# Patient Record
Sex: Female | Born: 1975 | Race: Black or African American | Hispanic: No | Marital: Married | Smoking: Never smoker
Health system: Southern US, Community
[De-identification: ages and names within clinical notes are randomized; demographics above are authoritative.]

## PROBLEM LIST (undated history)

## (undated) DIAGNOSIS — I519 Heart disease, unspecified: Secondary | ICD-10-CM

## (undated) DIAGNOSIS — D649 Anemia, unspecified: Secondary | ICD-10-CM

## (undated) DIAGNOSIS — G43909 Migraine, unspecified, not intractable, without status migrainosus: Secondary | ICD-10-CM

## (undated) DIAGNOSIS — M1990 Unspecified osteoarthritis, unspecified site: Secondary | ICD-10-CM

## (undated) DIAGNOSIS — O139 Gestational [pregnancy-induced] hypertension without significant proteinuria, unspecified trimester: Secondary | ICD-10-CM

## (undated) DIAGNOSIS — Z862 Personal history of diseases of the blood and blood-forming organs and certain disorders involving the immune mechanism: Secondary | ICD-10-CM

## (undated) DIAGNOSIS — R011 Cardiac murmur, unspecified: Secondary | ICD-10-CM

## (undated) DIAGNOSIS — I10 Essential (primary) hypertension: Secondary | ICD-10-CM

## (undated) DIAGNOSIS — O039 Complete or unspecified spontaneous abortion without complication: Secondary | ICD-10-CM

## (undated) DIAGNOSIS — J45909 Unspecified asthma, uncomplicated: Secondary | ICD-10-CM

## (undated) DIAGNOSIS — M75100 Unspecified rotator cuff tear or rupture of unspecified shoulder, not specified as traumatic: Secondary | ICD-10-CM

## (undated) DIAGNOSIS — E079 Disorder of thyroid, unspecified: Secondary | ICD-10-CM

## (undated) DIAGNOSIS — F1910 Other psychoactive substance abuse, uncomplicated: Secondary | ICD-10-CM

## (undated) DIAGNOSIS — F419 Anxiety disorder, unspecified: Secondary | ICD-10-CM

## (undated) DIAGNOSIS — T7840XA Allergy, unspecified, initial encounter: Secondary | ICD-10-CM

## (undated) DIAGNOSIS — G4733 Obstructive sleep apnea (adult) (pediatric): Secondary | ICD-10-CM

## (undated) HISTORY — DX: Unspecified osteoarthritis, unspecified site: M19.90

## (undated) HISTORY — DX: Obstructive sleep apnea (adult) (pediatric): G47.33

## (undated) HISTORY — DX: Unspecified rotator cuff tear or rupture of unspecified shoulder, not specified as traumatic: M75.100

## (undated) HISTORY — DX: Cardiac murmur, unspecified: R01.1

## (undated) HISTORY — DX: Disorder of thyroid, unspecified: E07.9

## (undated) HISTORY — DX: Migraine, unspecified, not intractable, without status migrainosus: G43.909

## (undated) HISTORY — DX: Heart disease, unspecified: I51.9

## (undated) HISTORY — DX: Anemia, unspecified: D64.9

## (undated) HISTORY — DX: Anxiety disorder, unspecified: F41.9

## (undated) HISTORY — DX: Allergy, unspecified, initial encounter: T78.40XA

## (undated) HISTORY — PX: KNEE ARTHROSCOPY: SUR90

## (undated) HISTORY — DX: Other psychoactive substance abuse, uncomplicated: F19.10

## (undated) HISTORY — PX: KNEE ARTHROSCOPY: SHX127

## (undated) HISTORY — DX: Essential (primary) hypertension: I10

## (undated) HISTORY — DX: Complete or unspecified spontaneous abortion without complication: O03.9

## (undated) HISTORY — DX: Unspecified asthma, uncomplicated: J45.909

## (undated) HISTORY — DX: Gestational (pregnancy-induced) hypertension without significant proteinuria, unspecified trimester: O13.9

## (undated) HISTORY — DX: Personal history of diseases of the blood and blood-forming organs and certain disorders involving the immune mechanism: Z86.2

---

## 1998-07-26 ENCOUNTER — Ambulatory Visit (HOSPITAL_BASED_OUTPATIENT_CLINIC_OR_DEPARTMENT_OTHER): Admission: RE | Admit: 1998-07-26 | Discharge: 1998-07-26 | Payer: Self-pay | Admitting: Orthopedic Surgery

## 1999-09-18 ENCOUNTER — Ambulatory Visit (HOSPITAL_COMMUNITY): Admission: RE | Admit: 1999-09-18 | Discharge: 1999-09-18 | Payer: Self-pay | Admitting: Gastroenterology

## 2000-10-21 ENCOUNTER — Other Ambulatory Visit: Admission: RE | Admit: 2000-10-21 | Discharge: 2000-10-21 | Payer: Self-pay | Admitting: *Deleted

## 2000-12-11 ENCOUNTER — Encounter: Payer: Self-pay | Admitting: Internal Medicine

## 2002-12-01 ENCOUNTER — Other Ambulatory Visit: Admission: RE | Admit: 2002-12-01 | Discharge: 2002-12-01 | Payer: Self-pay | Admitting: *Deleted

## 2003-12-15 LAB — HM COLONOSCOPY

## 2004-12-15 DIAGNOSIS — G43909 Migraine, unspecified, not intractable, without status migrainosus: Secondary | ICD-10-CM

## 2004-12-15 HISTORY — DX: Migraine, unspecified, not intractable, without status migrainosus: G43.909

## 2005-04-07 ENCOUNTER — Emergency Department (HOSPITAL_COMMUNITY): Admission: EM | Admit: 2005-04-07 | Discharge: 2005-04-07 | Payer: Self-pay | Admitting: Family Medicine

## 2006-10-15 DIAGNOSIS — M75100 Unspecified rotator cuff tear or rupture of unspecified shoulder, not specified as traumatic: Secondary | ICD-10-CM

## 2006-10-15 HISTORY — DX: Unspecified rotator cuff tear or rupture of unspecified shoulder, not specified as traumatic: M75.100

## 2006-11-01 ENCOUNTER — Emergency Department (HOSPITAL_COMMUNITY): Admission: EM | Admit: 2006-11-01 | Discharge: 2006-11-01 | Payer: Self-pay | Admitting: Emergency Medicine

## 2006-12-15 HISTORY — PX: SHOULDER ARTHROSCOPY: SHX128

## 2007-06-10 ENCOUNTER — Ambulatory Visit (HOSPITAL_BASED_OUTPATIENT_CLINIC_OR_DEPARTMENT_OTHER): Admission: RE | Admit: 2007-06-10 | Discharge: 2007-06-10 | Payer: Self-pay | Admitting: Orthopedic Surgery

## 2007-08-31 ENCOUNTER — Ambulatory Visit: Payer: Self-pay | Admitting: Internal Medicine

## 2007-08-31 ENCOUNTER — Encounter: Payer: Self-pay | Admitting: Internal Medicine

## 2007-08-31 DIAGNOSIS — I1A0 Resistant hypertension: Secondary | ICD-10-CM | POA: Insufficient documentation

## 2007-08-31 DIAGNOSIS — M549 Dorsalgia, unspecified: Secondary | ICD-10-CM | POA: Insufficient documentation

## 2007-08-31 DIAGNOSIS — R609 Edema, unspecified: Secondary | ICD-10-CM | POA: Insufficient documentation

## 2007-08-31 DIAGNOSIS — I10 Essential (primary) hypertension: Secondary | ICD-10-CM | POA: Insufficient documentation

## 2007-08-31 LAB — CONVERTED CEMR LAB
Alkaline Phosphatase: 75 units/L (ref 39–117)
BUN: 11 mg/dL (ref 6–23)
Basophils Relative: 0.4 % (ref 0.0–1.0)
Bilirubin Urine: NEGATIVE
Bilirubin, Direct: 0.1 mg/dL (ref 0.0–0.3)
CO2: 31 meq/L (ref 19–32)
Creatinine, Ser: 0.8 mg/dL (ref 0.4–1.2)
Crystals: NEGATIVE
Eosinophils Absolute: 0 10*3/uL (ref 0.0–0.6)
Eosinophils Relative: 0.6 % (ref 0.0–5.0)
Glucose, Bld: 95 mg/dL (ref 70–99)
Hemoglobin, Urine: NEGATIVE
Hemoglobin: 11.2 g/dL — ABNORMAL LOW (ref 12.0–15.0)
Ketones, ur: NEGATIVE mg/dL
Leukocytes, UA: NEGATIVE
Lymphocytes Relative: 21.9 % (ref 12.0–46.0)
MCV: 84.3 fL (ref 78.0–100.0)
Monocytes Absolute: 0.6 10*3/uL (ref 0.2–0.7)
Neutro Abs: 5.6 10*3/uL (ref 1.4–7.7)
Nitrite: NEGATIVE
Potassium: 2.7 meq/L — CL (ref 3.5–5.1)
TSH: 0.88 microintl units/mL (ref 0.35–5.50)
Total Bilirubin: 0.5 mg/dL (ref 0.3–1.2)
Total Protein, Urine: NEGATIVE mg/dL
Total Protein: 7.9 g/dL (ref 6.0–8.3)
Urine Glucose: NEGATIVE mg/dL
Urobilinogen, UA: 0.2 (ref 0.0–1.0)
WBC: 8 10*3/uL (ref 4.5–10.5)

## 2007-09-01 ENCOUNTER — Encounter: Payer: Self-pay | Admitting: Internal Medicine

## 2007-09-01 LAB — CONVERTED CEMR LAB: Blood Glucose, Fasting: 95 mg/dL

## 2007-10-09 ENCOUNTER — Encounter: Payer: Self-pay | Admitting: Internal Medicine

## 2007-10-11 ENCOUNTER — Ambulatory Visit: Payer: Self-pay | Admitting: Internal Medicine

## 2007-11-02 ENCOUNTER — Encounter: Payer: Self-pay | Admitting: Internal Medicine

## 2007-11-02 ENCOUNTER — Ambulatory Visit: Payer: Self-pay | Admitting: Internal Medicine

## 2007-11-02 LAB — CONVERTED CEMR LAB
BUN: 12 mg/dL (ref 6–23)
CO2: 24 meq/L (ref 19–32)
Chloride: 103 meq/L (ref 96–112)
Creatinine, Ser: 0.7 mg/dL (ref 0.4–1.2)
Sodium: 138 meq/L (ref 135–145)

## 2007-11-08 ENCOUNTER — Encounter: Payer: Self-pay | Admitting: Internal Medicine

## 2007-11-09 ENCOUNTER — Ambulatory Visit: Payer: Self-pay | Admitting: Internal Medicine

## 2007-11-09 DIAGNOSIS — R7309 Other abnormal glucose: Secondary | ICD-10-CM | POA: Insufficient documentation

## 2007-11-18 ENCOUNTER — Encounter: Payer: Self-pay | Admitting: Internal Medicine

## 2008-01-17 ENCOUNTER — Encounter: Payer: Self-pay | Admitting: Internal Medicine

## 2008-02-10 ENCOUNTER — Ambulatory Visit: Payer: Self-pay | Admitting: Internal Medicine

## 2008-03-08 ENCOUNTER — Ambulatory Visit: Payer: Self-pay | Admitting: Internal Medicine

## 2008-03-09 ENCOUNTER — Encounter: Payer: Self-pay | Admitting: Internal Medicine

## 2009-02-10 ENCOUNTER — Emergency Department (HOSPITAL_COMMUNITY): Admission: EM | Admit: 2009-02-10 | Discharge: 2009-02-10 | Payer: Self-pay | Admitting: Family Medicine

## 2009-08-18 ENCOUNTER — Emergency Department (HOSPITAL_COMMUNITY): Admission: EM | Admit: 2009-08-18 | Discharge: 2009-08-18 | Payer: Self-pay | Admitting: Emergency Medicine

## 2010-01-02 ENCOUNTER — Emergency Department (HOSPITAL_COMMUNITY): Admission: EM | Admit: 2010-01-02 | Discharge: 2010-01-02 | Payer: Self-pay | Admitting: Family Medicine

## 2010-06-05 ENCOUNTER — Encounter: Payer: Self-pay | Admitting: Family Medicine

## 2010-06-05 ENCOUNTER — Telehealth: Payer: Self-pay | Admitting: Family Medicine

## 2010-06-13 ENCOUNTER — Ambulatory Visit: Payer: Self-pay | Admitting: Family Medicine

## 2010-06-28 ENCOUNTER — Telehealth: Payer: Self-pay | Admitting: Family Medicine
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
## 2011-01-16 NOTE — Progress Notes (Signed)
Summary: Pt does not want to have cpx, just labs for cholesterol and bp  Phone Note Call from Patient   Caller: Patient Summary of Call: Pt called and decided that she does not want to have a cpx, she would just like to have labs done to check her cholesterol and her blood pressure. Pt also mentioned getting lab work done to check for cancer. Pt is sch for cpx labs on 07/04/10. Please advise.    Initial call taken by: Lucy Antigua,  June 28, 2010 3:16 PM  Follow-up for Phone Call        We can check lipid panel.  There are no standard labs done to check for things like ovarian cancer.  We can only screen for cervical cancer, breast cancer, etc through good PE and appropriate tests like pap smears.  If she refuses CPE she has to accept that she is choosing not to do appropriate age related screening. Follow-up by: Evelena Peat MD,  July 01, 2010 6:27 PM  Additional Follow-up for Phone Call Additional follow up Details #1::        Pt has been notified, as noted above. Pt is just coming in to get lipid panel only, on July 04, 2010. Pt still refuses CPX. Additional Follow-up by: Lucy Antigua,  July 02, 2010 10:20 AM

## 2011-01-16 NOTE — Progress Notes (Signed)
Summary: Request to Switch PCP  Phone Note Call from Patient   Caller: Patient Summary of Call: Patient would like to switch PCP from Dr. Debby Bud to Dr. Caryl Never Initial call taken by: Trixie Dredge,  June 05, 2010 12:18 PM  Follow-up for Phone Call        ok with me and good luck to Dr. Caryl Never Follow-up by: Jacques Navy MD,  June 05, 2010 1:29 PM  Additional Follow-up for Phone Call Additional follow up Details #1::        OK with me. Additional Follow-up by: Evelena Peat MD,  June 05, 2010 5:18 PM    Additional Follow-up for Phone Call Additional follow up Details #2::    lmom/lmom 06-12-2010 Follow-up by: Heron Sabins,  June 07, 2010 8:36 AM  Additional Follow-up for Phone Call Additional follow up Details #3:: Details for Additional Follow-up Action Taken: ov 06-13-2010 11.45am Additional Follow-up by: Heron Sabins,  June 12, 2010 12:43 PM

## 2011-01-16 NOTE — Letter (Signed)
Summary: Records from [facility] 2008 - 2011  Records from [facility] at [place] 2008 - 2011   Imported By: Maryln Gottron 06/28/2010 13:36:30  _____________________________________________________________________  External Attachment:    Type:   Image     Comment:   External Document

## 2011-01-30 ENCOUNTER — Telehealth: Payer: Self-pay | Admitting: Family Medicine

## 2011-01-30 NOTE — Telephone Encounter (Signed)
Pt wants a referral for [facility] for hand tremors... Pt adv she only wants a referral, doesn't want to have to come in for OV for same.... Pt can be reached at [phone] with any questions or concerns.

## 2011-01-30 NOTE — Telephone Encounter (Signed)
Pt must be seen. She had horribly controlled BP last visit and if we are to have any chance to help her she must improve her compliance with follow up and treatment.

## 2011-01-31 ENCOUNTER — Encounter: Payer: Self-pay | Admitting: Family Medicine

## 2011-01-31 ENCOUNTER — Telehealth: Payer: Self-pay | Admitting: Family Medicine

## 2011-01-31 ENCOUNTER — Ambulatory Visit (INDEPENDENT_AMBULATORY_CARE_PROVIDER_SITE_OTHER): Payer: BC Managed Care – PPO | Admitting: Family Medicine

## 2011-01-31 VITALS — BP 174/120 | Temp 98.4°F | Ht 69.5 in | Wt 200.0 lb

## 2011-01-31 DIAGNOSIS — I10 Essential (primary) hypertension: Secondary | ICD-10-CM

## 2011-01-31 MED ORDER — AMLODIPINE BESYLATE 5 MG PO TABS: 5.0000 mg | ORAL_TABLET | Freq: Every day | ORAL | Status: DC

## 2011-01-31 NOTE — Progress Notes (Signed)
  Subjective:    Patient ID: Leslie [name], female    DOB: April 11, 1976, 35 y.o.   MRN: [id number]  HPI  patient seen for the following issues  She has history of very poorly controlled blood pressure, poor compliance , and poor motivation to treat blood pressure from past. She has been seen once in urgent care center since last visit here many months ago and placed on plain Diovan. Had been on Diovan HCTZ. Not clear why she was switched to plain Diovan with poor control on combination therapy. In any event, she is only taking a half 160 mg Diovan frequently. She does not give any clear reason why she is not taking the full dose. Blood pressures have been very poorly controlled. She denies any headaches or dizziness. Increased job stress. Works as a Runner, broadcasting/film/video. Previous rash with lisinopril. Denies any recent headaches or chest pain.   subtle upper extremity and occasionally head and neck tremor worse with periods of increased stress. Her father has some type of tremor , presumably familial tremor. She denies any slowness of gait or a stiffness or focal weakness.   Patient denies any alcohol use. No illicit drug use. nonsmoker   Review of Systems  Constitutional: Positive for fatigue. Negative for fever, activity change, appetite change and unexpected weight change.  Eyes: Negative for visual disturbance.  Respiratory: Negative for cough, chest tightness, shortness of breath and wheezing.   Cardiovascular: Negative for chest pain, palpitations and leg swelling.  Neurological: Negative for syncope and headaches.  Psychiatric/Behavioral: Negative for confusion.       Objective:   Physical Exam  patient is alert and in no distress.    pupils equal reactive to light. Fundi are benign. No exudates or hemorrhages.   oropharynx clear  Neck no mass and no adenopathy Chest clear to auscultation Heart regular in rate with no murmur Extremities no edema  neurologic exam no visible tremor at this  time. No focal weakness. Gait is normal. Deep tender reflexes are 2+ throughout upper and lower extremities. Cranial nerves all intact. Cerebellar function normal.       Assessment & Plan:   #1 poorly controlled hypertension with history of severe poor compliance  #2 reported tremors with none evident today. 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 She is aware of potential for kidney failure , heart failure , and history of vascular disease in general with poorly controlled blood pressure. Add amlodipine 5 mg daily . Continue Diovan 160 mg. Reassess one month.  We also discussed importance of regular aerobic exercise once BP better controlled and need for weight loss.

## 2011-01-31 NOTE — Telephone Encounter (Signed)
Info only concerning f/u....Marland KitchenMarland KitchenPt was requesting referral to [facility]... Dr Caryl Never adv she needs to come in for eval / OV... Pt was advised of same and appt was scheduled.

## 2011-02-02 ENCOUNTER — Encounter: Payer: Self-pay | Admitting: Family Medicine

## 2011-02-05 ENCOUNTER — Telehealth: Payer: Self-pay | Admitting: Family Medicine

## 2011-02-05 NOTE — Telephone Encounter (Signed)
Pt called and said BP is 150/122. Pt is req to go back on Diovan HCTZ160 mg. Pls call in to [facility].

## 2011-02-05 NOTE — Telephone Encounter (Signed)
Please advise 

## 2011-02-12 ENCOUNTER — Encounter: Payer: Self-pay | Admitting: Family Medicine

## 2011-02-12 ENCOUNTER — Ambulatory Visit (INDEPENDENT_AMBULATORY_CARE_PROVIDER_SITE_OTHER): Payer: BC Managed Care – PPO | Admitting: Family Medicine

## 2011-02-12 VITALS — BP 142/104 | Temp 98.2°F | Ht 70.0 in | Wt 203.0 lb

## 2011-02-12 DIAGNOSIS — I10 Essential (primary) hypertension: Secondary | ICD-10-CM

## 2011-02-12 MED ORDER — VALSARTAN-HYDROCHLOROTHIAZIDE 160-12.5 MG PO TABS: 1.0000 | ORAL_TABLET | Freq: Every day | ORAL | Status: DC

## 2011-02-12 NOTE — Progress Notes (Signed)
  Subjective:    Patient ID: Leslie [name], female    DOB: [birth year], 35 y.o.   MRN: [id number]  HPI  Patient seen for followup hypertension. Long history of poor compliance. She has finally agreed to take her medication. Addition of amlodipine 5 mg daily last visit and taking Diovan somewhat inconsistently but most days one half tablet. Headaches have improved. Overall feels much better since starting regular blood pressure medication. No chest pains. By home blood pressure machine 140s to 150s systolic and 90s 409 diastolic.   Review of Systems  patient denies any headache , dizziness, chest pain, peripheral edema    Objective:   Physical Exam  the patient is alert and in no distress.  Pupils equal round reactive to light Neck supple no mass Chest clear to auscultation Heart regular rhythm and rate Extremities no edema       Assessment & Plan:   hypertension which is improved but not to goal. Continue weight loss efforts. Continue amlodipine 5 mg daily. Add back Diovan HCTZ 160/12.5 one daily. Reassess blood pressure within 3 months and continue home monitoring

## 2011-02-21 ENCOUNTER — Encounter: Payer: Self-pay | Admitting: Family Medicine

## 2011-02-21 ENCOUNTER — Ambulatory Visit (INDEPENDENT_AMBULATORY_CARE_PROVIDER_SITE_OTHER): Payer: BC Managed Care – PPO | Admitting: Family Medicine

## 2011-02-21 VITALS — BP 140/110 | Temp 98.8°F | Ht 69.5 in | Wt 205.0 lb

## 2011-02-21 DIAGNOSIS — I10 Essential (primary) hypertension: Secondary | ICD-10-CM

## 2011-02-21 DIAGNOSIS — F411 Generalized anxiety disorder: Secondary | ICD-10-CM

## 2011-02-21 DIAGNOSIS — J029 Acute pharyngitis, unspecified: Secondary | ICD-10-CM

## 2011-02-21 DIAGNOSIS — F419 Anxiety disorder, unspecified: Secondary | ICD-10-CM

## 2011-02-21 MED ORDER — LORAZEPAM 0.5 MG PO TABS: 0.5000 mg | ORAL_TABLET | Freq: Three times a day (TID) | ORAL | Status: AC

## 2011-02-21 NOTE — Progress Notes (Signed)
  Subjective:    Patient ID: Leslie [name], female    DOB: [birth year], 35 y.o.   MRN: [id number]  HPI  patient seen for the following issues  Sore throat 3 days duration. Some nasal congestion and occasional dry cough. 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 Diffuse body aches. Denies headaches, nausea, or vomiting. Has not taken anything for relief. No aggravating factors. Symptoms seem to be worse early in the morning and at night.    hypertension. Patient history of poor compliance. Has recently been on amlodipine 5 mg daily and Diovan HCTZ 160/12.5 mg one daily. She has had increased compliance recently. No dizziness. No medication side effects.    new problem of complains of increased situational anxiety. Very stressful job Agricultural consultant.  She has bilateral upper extremity tremors during things like parent conference. These are situational. Denies depression   Review of Systems  Constitutional: Positive for fatigue. Negative for fever, chills, activity change, appetite change and unexpected weight change.  HENT: Positive for congestion, sore throat and voice change. Negative for neck pain, neck stiffness and sinus pressure.   Respiratory: Positive for cough. Negative for shortness of breath, wheezing and stridor.   Cardiovascular: Negative for chest pain and palpitations.  Gastrointestinal: Negative for nausea, vomiting, diarrhea, constipation and abdominal distention.  Genitourinary: Negative for dysuria.  Neurological: Negative for dizziness and headaches.  Hematological: Negative for adenopathy.       Objective:   Physical Exam  Constitutional: She is oriented to person, place, and time. She appears well-developed and well-nourished. No distress.  HENT:  Head: Normocephalic and atraumatic.  Right Ear: External ear normal.  Left Ear: External ear normal.  Mouth/Throat: Oropharynx is clear and moist.       Mild erythema post pharynx  Eyes: Conjunctivae and EOM are normal. Pupils  are equal, round, and reactive to light.  Neck: Normal range of motion. Neck supple. No thyromegaly present.  Cardiovascular: Normal rate, regular rhythm and normal heart sounds.  Exam reveals no gallop.   No murmur heard. Pulmonary/Chest: Effort normal and breath sounds normal. She has no wheezes. She has no rales.  Lymphadenopathy:    She has no cervical adenopathy.  Neurological: She is alert and oriented to person, place, and time. No cranial nerve deficit.  Skin: No rash noted.          Assessment & Plan:   #1 probable viral pharyngitis. Rapid strep negative. Try Motrin 800 mg every 8 hours as needed #2 hypertension improved continue current medications and work on weight loss.  #3 situational anxiety. Short-term use of lorazepam 0.5 mg every 8 hours when necessary. Consider counseling for more effective management of anxiety it persist

## 2011-02-21 NOTE — Patient Instructions (Signed)
Drink plenty of fluids. Take Motrin 800 mg every 8 hours as needed for sore throat.

## 2011-02-23 ENCOUNTER — Encounter: Payer: Self-pay | Admitting: Family Medicine

## 2011-02-28 ENCOUNTER — Ambulatory Visit: Payer: BC Managed Care – PPO | Admitting: Family Medicine

## 2011-04-15 ENCOUNTER — Telehealth: Payer: Self-pay | Admitting: Family Medicine

## 2011-04-15 NOTE — Telephone Encounter (Signed)
Pt called and is req a call back from Dr Caryl Never or nurse. Pt said that she went to see Dr Cleta Alberts at [facility] re: pain in hip or leg. Pt says xray was done, and a spot was found right above pts hipbone that could be cancerous. Pt should be getting the xray on thrusday. Pls call.

## 2011-04-16 NOTE — Telephone Encounter (Signed)
Please advise 

## 2011-04-16 NOTE — Telephone Encounter (Signed)
Message left on pt personally identified VM 

## 2011-04-16 NOTE — Telephone Encounter (Signed)
Does pt have X-ray scheduled per Dr Cleta Alberts?  Cannot comment on X-ray done at their office without review.  She should feel free to bring x-ray copy here if she wishes for [place] to review.

## 2011-04-29 ENCOUNTER — Encounter: Payer: Self-pay | Admitting: Internal Medicine
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
## 2011-05-01 ENCOUNTER — Ambulatory Visit (INDEPENDENT_AMBULATORY_CARE_PROVIDER_SITE_OTHER): Payer: BC Managed Care – PPO | Admitting: Internal Medicine

## 2011-05-01 ENCOUNTER — Encounter: Payer: Self-pay | Admitting: Internal Medicine

## 2011-05-01 VITALS — BP 138/109 | HR 85 | Temp 97.3°F | Wt 202.0 lb

## 2011-05-01 DIAGNOSIS — R9389 Abnormal findings on diagnostic imaging of other specified body structures: Secondary | ICD-10-CM
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
## 2011-05-04 ENCOUNTER — Encounter: Payer: Self-pay | Admitting: Internal Medicine

## 2011-05-09 ENCOUNTER — Encounter: Payer: Self-pay | Admitting: Internal Medicine

## 2011-05-15 ENCOUNTER — Ambulatory Visit: Payer: BC Managed Care – PPO | Admitting: Family Medicine

## 2011-06-27 ENCOUNTER — Telehealth: Payer: Self-pay | Admitting: *Deleted

## 2011-06-27 NOTE — Telephone Encounter (Signed)
Pt found out today that she is pregnant. She states her obgyn told her not to take her amlodipine or diovan to you have suggestions for alternative

## 2011-07-01 MED ORDER — METHYLDOPA 250 MG PO TABS: 250.0000 mg | ORAL_TABLET | Freq: Two times a day (BID) | ORAL | Status: DC

## 2011-07-01 NOTE — Telephone Encounter (Signed)
Addended by: Rock Nephew T on: 07/01/2011 11:10 AM   Modules accepted: Orders

## 2011-07-01 NOTE — Telephone Encounter (Signed)
For BP consistently less than 150/95 will hold off on any medication in the first trimester. For BPs running higher recommend methyldopa 250mg  bid, #60 refill x 8

## 2011-07-01 NOTE — Telephone Encounter (Signed)
Patient notified per MD and rx sent to pharmacy. Patient would also like  MD advisement if ok to drink water and vinegar for BP

## 2011-07-04 ENCOUNTER — Ambulatory Visit (INDEPENDENT_AMBULATORY_CARE_PROVIDER_SITE_OTHER): Payer: BC Managed Care – PPO | Admitting: Internal Medicine

## 2011-07-04 ENCOUNTER — Encounter: Payer: Self-pay | Admitting: Internal Medicine

## 2011-07-04 DIAGNOSIS — I10 Essential (primary) hypertension: Secondary | ICD-10-CM

## 2011-07-04 MED ORDER — IBUPROFEN 600 MG PO TABS: 600.0000 mg | ORAL_TABLET | Freq: Four times a day (QID) | ORAL | Status: AC | PRN

## 2011-07-04 NOTE — Patient Instructions (Signed)
For treatment of blood pressure while you have the potential to get pregnant you need to be taking a drug with the lowest possible fetal risk. Will start with methyldopa 250 mg AM and PM. If after 3-5 days your blood pressure continues to run high will increase to 500 mg AM and PM and we will continue to increase the dose of medication until the BP is controlled or we get to a total daily dose of 3,000 mg.  For new OB/Gyn suggest Dr. Dierdre Forth, [facility] [address] [phone]

## 2011-07-08 NOTE — Progress Notes (Signed)
  Subjective:    Patient ID: Leslie [name], female    DOB: 02/10/1976, 35 y.o.   MRN: [id number]  HPI Leslie [name] had called due to her being pregnant and having a need to change her medications. Unfortunately she lost the pregnancy. Her BP does run high. We discussed that she is actively trying to get pregnant so that this affects the choice of medications that are safe to use. Many BP medicaions have serious affects in the first trimester. She has been feeling OK. With her last experience she wishes to change OB/GYN - she is referred to Dr. Pennie Rushing.   PMH, FamHx and SocHx reviewed for any changes and relevance.    Review of Systems No constitutional, HEENT, PULONARY,CARDIA,GI OR GYN ISSUES ON REVIEW.     Objective:   Physical Exam Vitals stable WNWD AA woman in no distress Pul - normal respirations Cor - RRR       Assessment & Plan:

## 2011-07-08 NOTE — Assessment & Plan Note (Signed)
Patient will start on methyldopa 250 mg bid and will titrate up as needed for control.

## 2011-08-25 ENCOUNTER — Telehealth: Payer: Self-pay | Admitting: *Deleted

## 2011-08-25 MED ORDER — METHYLDOPA 250 MG PO TABS: 250.0000 mg | ORAL_TABLET | Freq: Three times a day (TID) | ORAL | Status: DC

## 2011-08-25 NOTE — Telephone Encounter (Signed)
Pt called and states her BP has been elevated with talking her BP med BID. I spoke with Dr Debby Bud and he advised for pt to take Aldomet 250 mg 1 TID. PT informed and medication sent in to pharm

## 2011-10-01 LAB — POCT PREGNANCY, URINE
Operator id: 114531
Preg Test, Ur: NEGATIVE

## 2011-10-01 LAB — POCT I-STAT 4, (NA,K, GLUC, HGB,HCT)
Potassium: 3.4 — ABNORMAL LOW
Sodium: 138

## 2011-11-26 ENCOUNTER — Ambulatory Visit (INDEPENDENT_AMBULATORY_CARE_PROVIDER_SITE_OTHER): Payer: BC Managed Care – PPO

## 2011-11-26 DIAGNOSIS — R110 Nausea: Secondary | ICD-10-CM

## 2011-11-26 DIAGNOSIS — R197 Diarrhea, unspecified: Secondary | ICD-10-CM

## 2012-03-26 ENCOUNTER — Encounter (HOSPITAL_COMMUNITY): Payer: Self-pay | Admitting: *Deleted

## 2012-03-26 ENCOUNTER — Emergency Department (HOSPITAL_COMMUNITY)
Admission: EM | Admit: 2012-03-26 | Discharge: 2012-03-26 | Disposition: A | Discharge status: Home / Self Care | Payer: BC Managed Care – PPO | Source: Home / Self Care | Attending: Emergency Medicine | Admitting: Emergency Medicine

## 2012-03-26 DIAGNOSIS — J019 Acute sinusitis, unspecified: Secondary | ICD-10-CM

## 2012-03-26 DIAGNOSIS — J209 Acute bronchitis, unspecified: Secondary | ICD-10-CM

## 2012-03-26 MED ORDER — BENZONATATE 200 MG PO CAPS: 200.0000 mg | ORAL_CAPSULE | Freq: Three times a day (TID) | ORAL | Status: AC | PRN

## 2012-03-26 MED ORDER — AMOXICILLIN-POT CLAVULANATE 875-125 MG PO TABS: 1.0000 | ORAL_TABLET | Freq: Two times a day (BID) | ORAL | Status: AC

## 2012-03-26 MED ORDER — ALBUTEROL SULFATE HFA 108 (90 BASE) MCG/ACT IN AERS: 1.0000 | INHALATION_SPRAY | Freq: Four times a day (QID) | RESPIRATORY_TRACT | Status: DC | PRN

## 2012-03-26 NOTE — Discharge Instructions (Signed)

## 2012-03-26 NOTE — ED Provider Notes (Signed)
Chief Complaint  Patient presents with  . URI    History of Present Illness:   The patient is a 36 year old female who has had a one-week history of nasal congestion, postnasal drip, headache, and cough productive of yellow sputum. She also notes ear congestion and aching, she has some fever at the onset of her symptoms for about 4 days but then this went away. She also has sore throat, loose stools, her mouth feels dry. She took penicillin 500 mg a day for about 4 days and stopped. [facility] when this all began and was diagnosed with the flu. She was given Flonase which he did not use.  She also has had a long history of high blood pressure. She sees Dr. Illene Regulus for this. She is trying to get pregnant, so he has started her on Aldomet 250 mg 3 times a day. She takes this intermittently and sporadically. She has many excuses why she cannot take it and why she has not see Dr. Debby Bud. She was on Diovan, but Dr. Debby Bud would not prescribe it. She's ordered some from [place] but has not gotten it yet. I told her in no uncertain terms that Diovan was not to be used during pregnancy and she should not take it if she was trying to get pregnant. She denies any headaches, dizziness, shortness of breath, or chest pain.  Review of Systems:  Other than noted above, the patient denies any of the following symptoms. Systemic:  No fever, chills, sweats, fatigue, myalgias, headache, or anorexia. Eye:  No redness, pain or drainage. ENT:  No earache, nasal congestion, rhinorrhea, sinus pressure, or sore throat. Lungs:  No cough, sputum production, wheezing, shortness of breath. Or chest pain. GI:  No nausea, vomiting, abdominal pain or diarrhea. Skin:  No rash or itching.  PMFSH:  Past medical history, family history, social history, meds, and allergies were reviewed.  Physical Exam:   Vital signs:  BP 160/100  Pulse 84  Temp(Src) 98.7 F (37.1 C) (Oral)  Resp 20  SpO2 99%   LMP 03/22/2012 General:  Alert, in no distress. Eye:  No conjunctival injection or drainage. ENT:  TMs and canals were normal, without erythema or inflammation.  Nasal mucosa was congested with yellowish drainage bilaterally.  Mucous membranes were moist.  Pharynx was clear, without exudate or drainage.  There were no oral ulcerations or lesions. Neck:  Supple, no adenopathy, tenderness or mass. Lungs:  No respiratory distress.  She has bilateral rhonchi heard anteriorly, no wheezes or rales.  Breath sounds were clear and equal bilaterally. Heart:  Regular rhythm, without gallops, murmers or rubs. Skin:  Clear, warm, and dry, without rash or lesions.  Assessment:  The primary encounter diagnosis was Acute sinusitis. A diagnosis of Acute bronchitis was also pertinent to this visit.  Plan:   1.  The following meds were prescribed:   New Prescriptions   ALBUTEROL (PROVENTIL HFA;VENTOLIN HFA) 108 (90 BASE) MCG/ACT INHALER    Inhale 1-2 puffs into the lungs every 6 (six) hours as needed for wheezing.   AMOXICILLIN-CLAVULANATE (AUGMENTIN) 875-125 MG PER TABLET    Take 1 tablet by mouth 2 (two) times daily.   BENZONATATE (TESSALON) 200 MG CAPSULE    Take 1 capsule (200 mg total) by mouth 3 (three) times daily as needed for cough.   2.  The patient was instructed in symptomatic care and handouts were given. 3.  The patient was told to  return if becoming worse in any way, if no better in 3 or 4 days, and given some red flag symptoms that would indicate earlier return.  Follow up:  The patient was told to follow up with Dr. Illene Regulus next week for blood pressure. I told her in the meantime to take her Aldomet regularly, 3 times a day, and not to take the Diovan.    Reuben Likes, MD 03/26/12 2107

## 2012-03-26 NOTE — ED Notes (Signed)
Pt c/o sinus and nasal congestion with sorethroat onset over a week ago.  [facility] and was given Flonase and Zyrtec.  STates she hasn't used the Flonase because "I don't like snorting anything in my nose".  Also states that she had some Penicillin at home and took 1 500mg  a day between Sunday and Wednesday.  Pt's BP is elevated, states she's not had her Diovan since Sunday, only takes her Aldomet at night, supposed to take it 3 times a day.  Staets she doesn't have time to worry about it.

## 2012-04-07 ENCOUNTER — Encounter: Payer: Self-pay | Admitting: Internal Medicine

## 2012-04-07 ENCOUNTER — Ambulatory Visit (INDEPENDENT_AMBULATORY_CARE_PROVIDER_SITE_OTHER): Payer: BC Managed Care – PPO | Admitting: Internal Medicine

## 2012-04-07 VITALS — BP 160/102 | HR 78 | Temp 98.5°F | Resp 16 | Wt 209.0 lb

## 2012-04-07 DIAGNOSIS — R059 Cough, unspecified: Secondary | ICD-10-CM

## 2012-04-07 DIAGNOSIS — I10 Essential (primary) hypertension: Secondary | ICD-10-CM

## 2012-04-07 DIAGNOSIS — R05 Cough: Secondary | ICD-10-CM

## 2012-04-07 MED ORDER — BENZONATATE 200 MG PO CAPS: 200.0000 mg | ORAL_CAPSULE | Freq: Three times a day (TID) | ORAL | Status: DC | PRN

## 2012-04-07 MED ORDER — METOPROLOL SUCCINATE ER 50 MG PO TB24: 50.0000 mg | ORAL_TABLET | Freq: Every day | ORAL | Status: DC

## 2012-04-07 MED ORDER — PREDNISONE 10 MG PO TABS: 10.0000 mg | ORAL_TABLET | Freq: Every day | ORAL | Status: DC

## 2012-04-07 MED ORDER — PROMETHAZINE-CODEINE 6.25-10 MG/5ML PO SYRP: 5.0000 mL | ORAL_SOLUTION | ORAL | Status: AC | PRN

## 2012-04-07 MED ORDER — GABAPENTIN 600 MG PO TABS: 600.0000 mg | ORAL_TABLET | Freq: Three times a day (TID) | ORAL | Status: DC

## 2012-04-07 NOTE — Patient Instructions (Signed)
Cough - there is no evidence of any infection. 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 Plan - prednisone burst and taper as directed; continue to take tessalon perles three times a day; during the day use robitussin Dm (or the generic equivalent) every 4 hours and at evening/ bedtime take promethazine with codeine. No indication for antibiotics. You are not infectious.  Blood pressure - a serious problem. Plan - stop methyldopa. Start Metoprolol XR 50 mg once a day. You will need to monitor your blood pressure or return for a blood pressure check in 3-4 weeks.

## 2012-04-07 NOTE — Progress Notes (Signed)
  Subjective:    Patient ID: Leslie [name], female    DOB: Jan 15, 1976, 36 y.o.   MRN: [id number]  HPI Ms. Depass present for follow-up of URI. [facility] - diagnosed with flu due to temp of 100 and cough. She was not treated with antibiotics or tamiflu. She continued to have a productive cough and went to [facility] 12th- note reviewed. She was started on Augmentin and tessalon perles. She has finished the antibiotic and has one tessalon left. She continues to cough but swallows any mucus. She has not had any recurrent fever. She reports that deep inspiration causes coughing and that she has DOE, has to stop to rest while climbing stair. She is not taking any cough syrup or product on a regular basis.  She continues to have high blood pressure. She has been taking methydopa once or twice a day. She has had some chest pain which sounds more like chest wall pain as opposed to Cardiac type pain.  PMH, FamHx and SocHx reviewed for any changes and relevance.    Review of Systems System review is negative for any constitutional, cardiac, pulmonary, GI or neuro symptoms or complaints other than as described in the HPI.      Objective:   Physical Exam Filed Vitals:   04/07/12 1626  BP: 160/102  Pulse: 78  Temp: 98.5 F (36.9 C)  Resp: 16   Wt Readings from Last 3 Encounters:  04/07/12 209 lb (94.802 kg)  07/04/11 207 lb (93.895 kg)  05/01/11 202 lb (91.627 kg)   Gen'l- overweight AA woman in no distress HEENT- throat clear Nodes - negative Pulm - normal breath sounds, no rales or wheezes, no increased work of breathing Cor - RRR       Assessment & Plan:  Cyclical cough - explained to patient in detail. She is resistant to treatment suggestions.  Plan -  tessalon perles  Short course of prednisone  Prom/cod for night-time use

## 2012-04-11 NOTE — Assessment & Plan Note (Signed)
Continues to have poor control. She does not take methyldopa as scheduled. She does admit that she continues to try to get pregnant. Explained the teratogenic side effects of ARB and ACE-I drugs.  Plan - toprol XL 50 mg once a day           BP check in 1-2 weeks.

## 2012-06-03 ENCOUNTER — Telehealth: Payer: Self-pay

## 2012-06-03 NOTE — Telephone Encounter (Signed)
Pt would like to have her hemosure lab results  from last summer pt is unsure what date.

## 2012-06-04 NOTE — Telephone Encounter (Signed)
Printed out phone message due to patient only having a paper chart. °

## 2012-06-05 ENCOUNTER — Ambulatory Visit (INDEPENDENT_AMBULATORY_CARE_PROVIDER_SITE_OTHER): Payer: BC Managed Care – PPO | Admitting: Family Medicine

## 2012-06-05 VITALS — BP 163/102 | HR 82 | Temp 99.0°F | Resp 16 | Ht 70.0 in | Wt 206.0 lb

## 2012-06-05 DIAGNOSIS — Z129 Encounter for screening for malignant neoplasm, site unspecified: Secondary | ICD-10-CM

## 2012-06-05 DIAGNOSIS — I10 Essential (primary) hypertension: Secondary | ICD-10-CM

## 2012-06-05 NOTE — Patient Instructions (Signed)
Do a followup on your blood pressure with your primary care physician. It was 167/104. You might need either an increasing the metoprolol or adding something like a mild diuretic to it.  During the 3 stool specimens back for checked for blood.

## 2012-06-05 NOTE — Progress Notes (Signed)
Subjective: Patient is here with a cancer policy that she wants a cefepime assures done for. The policy is very vague it doesn't make it clear exactly what she is getting her why she is getting it.  She is also on a blood pressure medication, was recently switched to metoprolol.  Objective: Blood pressure is still elevated today. I got 167/104.  Assessment: Screening for colon cancer Hypertension  Plan: He was sure x3 Advised he should followup with her other physicians on the blood pressure. She may need the metoprolol increased or hydrochlorothiazide would be my considerations.

## 2012-06-08 ENCOUNTER — Other Ambulatory Visit: Payer: Self-pay | Admitting: *Deleted

## 2012-06-15 ENCOUNTER — Encounter: Payer: Self-pay | Admitting: Obstetrics and Gynecology

## 2012-07-01 ENCOUNTER — Ambulatory Visit (INDEPENDENT_AMBULATORY_CARE_PROVIDER_SITE_OTHER): Payer: BC Managed Care – PPO | Admitting: Obstetrics and Gynecology

## 2012-07-01 ENCOUNTER — Encounter: Payer: Self-pay | Admitting: Obstetrics and Gynecology

## 2012-07-01 VITALS — BP 142/72 | Ht 70.0 in | Wt 206.0 lb

## 2012-07-01 DIAGNOSIS — N970 Female infertility associated with anovulation: Secondary | ICD-10-CM

## 2012-07-01 DIAGNOSIS — I10 Essential (primary) hypertension: Secondary | ICD-10-CM

## 2012-07-01 DIAGNOSIS — Z8669 Personal history of other diseases of the nervous system and sense organs: Secondary | ICD-10-CM

## 2012-07-01 NOTE — Progress Notes (Signed)
Subjective:    Leslie [name] is a 36 y.o. female G1P0010 who presents for evaluation of infertility. Patient and partner have been attempting conception since September 2012. Partner:  is 92 years old does not have children. Couple has been attempting conception since September 2012. Pregnancies with current partner: SAB in July 2012. The patient gives a history of having ordered Clomid from a pharmacy in [place] and taking it "off and on".  She states she would like to have twins and this is the only way she knows to ensure it. She states she is certain the medication has no side effects.  Menstrual history:   LMP:  Patient's last menstrual period was 06/21/2012. Menarche:  36 years old Menstrual cycle:  flow is moderate and regular every 30 days without intermenstrual spotting Pelvic pain: none  Endocrine history:   Basal body temperature Biphasic not done  TSH Pending {  Prolactin pending   FSH pending  Day 21 progesterone pending  Hysterosalpyngogram not done  Glucose:Insulin ratio not done {  Semen analysis not done.  has fathered a prior pregnancy  Medications Clomid (see Notes above)  Other therapies None known  Insemination none   Obstetrical history:   one prior pregnancy ending in spontaneous abortion  Contraception history:   None married since 2011 and trying to conceive since then   Habits:   Patient reports:  History   Social History  . Marital Status: Single    Spouse Name: N/A    Number of Children: N/A  . Years of Education: 54   Occupational History  . teacher    Social History Main Topics  . Smoking status: Never Smoker   . Smokeless tobacco: Never Used  . Alcohol Use: No  . Drug Use: No  . Sexually Active: Yes -- Female partner(s)    Birth Control/ Protection: None   Other Topics Concern  . None   Social History Narrative   [employer] - Pharmacologist; [employer] -MS Programmer, systems.  Married - '11. No children. Spends weekdays with her  grandfather who requires some attendance, weekends with her husband in their home. 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      The following portions of the patient's history were reviewed and updated as appropriate: allergies, current medications, past family history, past medical history, past social history, past surgical history and problem list.  Review of Systems Pertinent items are noted in HPI.   Objective:     BP 142/72  Ht 5\' 10"  (1.778 m)  Wt 206 lb (93.441 kg)  BMI 29.56 kg/m2  LMP 06/21/2012  Breastfeeding? Unknown   Wt Readings from Last 1 Encounters:  07/01/12 206 lb (93.441 kg)    BMI: Body mass index is 29.56 kg/(m^2). General Appearance: Alert, appropriate appearance for age. 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  Self-medication for undiagnosed etiology of infertility withClomid ordered from [place] Chronic hypertension Migraine headache Anxiety  Plan:   Tests ordered:   Declined to have any testing today. Release of information is obtained for records from her prior physician as she states that she did lots of blood work. We had a prolonged discussion about our physician patient/client relationship. I advised the patient that I would be unable to continue working with her if she continued to self medicate with prescription  medications that were ordered and ingested without my knowledge and without my input. She agreed that she would no longer take the unprescribed Clomid medication.   Release of information for all tests ordered from prior gynecologist If [facility], LH, TSH,  Prolactin, and progesterone have not been ordered she will have those drawn on 07/12/2012, day 22 of her cycle The patient is to make an appointment with her headache wellness physician to assess her  continued need for Neurontin for management of her migraines She is to discontinue use of her Xanax (category D.) psychiatric and counseling followup will be recommended if needed once this medication is discontinued She will begin Pregnitude once daily Follow-up:  in 4 week(s)  Riya Huxford PMD 7/19/201312:53 PM

## 2012-07-01 NOTE — Patient Instructions (Addendum)
OTC Pregnitude once daily

## 2012-07-02 ENCOUNTER — Encounter: Payer: Self-pay | Admitting: Obstetrics and Gynecology

## 2012-07-02 MED ORDER — INOSITOL-FOLIC ACID 2000-200 MG-MCG PO PACK: 1.0000 | PACK | Freq: Every day | ORAL | Status: DC

## 2012-07-12 ENCOUNTER — Other Ambulatory Visit: Payer: BC Managed Care – PPO

## 2012-07-12 DIAGNOSIS — N970 Female infertility associated with anovulation: Secondary | ICD-10-CM

## 2012-07-12 LAB — PROLACTIN: Prolactin: 13.6 ng/mL

## 2012-07-29 ENCOUNTER — Encounter: Payer: Self-pay | Admitting: Obstetrics and Gynecology

## 2012-07-29 ENCOUNTER — Ambulatory Visit (INDEPENDENT_AMBULATORY_CARE_PROVIDER_SITE_OTHER): Payer: BC Managed Care – PPO | Admitting: Obstetrics and Gynecology

## 2012-07-29 VITALS — BP 132/72 | Ht 70.0 in | Wt 210.0 lb

## 2012-07-29 DIAGNOSIS — N970 Female infertility associated with anovulation: Secondary | ICD-10-CM

## 2012-07-29 DIAGNOSIS — F419 Anxiety disorder, unspecified: Secondary | ICD-10-CM

## 2012-07-29 DIAGNOSIS — F411 Generalized anxiety disorder: Secondary | ICD-10-CM

## 2012-07-29 NOTE — Progress Notes (Signed)
Subjective:  Infertility: Declines Vaginal Exam.   Last Pap: 1 year ago Trying to Conceive: yes G,P: 1,0 TSH: Normal FSH/LH: 2.7/2.4 PRL: normal DAY 21 PROG: 2.4 but performed to 3 days prior to an early cycle HSG: not done Semen Analysis: not done LSC: none FBS/INSULIN:none GC/CHLAMYDIA: not done      Leslie [name] is a 36 y.o. female G1P0010 who presents with a diagnosis of  Infertility. She had a spontaneous pregnancy which and. In spontaneous abortion and in the Spring of 2012.  She has been trying since that time to become pregnant. Her laboratory studies from her last visit are consistent with anovulation.  Current medication: see Current listing. The patient had her Neurontin which was being used for migraine headaches discontinued by her physician at the headache wellness Center.  She has likewise discontinued Xanax.  Objective:   Infertility laboratory results have been updated and/or reviewed:  yes    BP 132/72  Ht 5\' 10"  (1.778 m)  Wt 210 lb (95.255 kg)  BMI 30.13 kg/m2  LMP 07/15/2012  Wt Readings from Last 1 Encounters:  07/29/12 210 lb (95.255 kg)    BMI: Body mass index is 30.13 kg/(m^2).  Pelvic exam:   declined  Assessment:  Primary infertility Probable oligo ovulation  Plan:  Clomid 50 mg p.o. Daily days 3 through 7 Day 21 progesterone Timed intercourse every other day from day 11 through 17 Follow-up: between days 28 and 32 of the next cycle   Delaine Lame 8/15/20132:17 PM

## 2012-07-30 ENCOUNTER — Telehealth: Payer: Self-pay

## 2012-07-30 NOTE — Telephone Encounter (Signed)
Tc from pt per telephone message. Pt wants to let vph know that she was given Reglan 10mg  by Dr. Neale Burly for her migraines. Will make vph aware.

## 2012-08-02 ENCOUNTER — Other Ambulatory Visit: Payer: Self-pay | Admitting: Obstetrics and Gynecology

## 2012-08-11 ENCOUNTER — Other Ambulatory Visit: Payer: Self-pay

## 2012-08-11 DIAGNOSIS — N979 Female infertility, unspecified: Secondary | ICD-10-CM

## 2012-08-11 MED ORDER — CLOMIPHENE CITRATE 50 MG PO TABS: ORAL_TABLET | ORAL | Status: DC

## 2012-08-24 ENCOUNTER — Telehealth: Payer: Self-pay | Admitting: Obstetrics and Gynecology

## 2012-08-24 NOTE — Telephone Encounter (Signed)
Tc from pt. Pt states,"needs labs after taking Clomid per Dr.Haygood". LMP-08/11/12. Day 21 progesterone sched 08/31/12 with lab only and fu with vh on 09/06/12 with vph. Pt voices understanding.

## 2012-08-24 NOTE — Telephone Encounter (Signed)
Lm on vm to cb per telephone call.  

## 2012-08-24 NOTE — Telephone Encounter (Signed)
vph pt 

## 2012-08-31 ENCOUNTER — Other Ambulatory Visit: Payer: BC Managed Care – PPO

## 2012-08-31 DIAGNOSIS — N979 Female infertility, unspecified: Secondary | ICD-10-CM

## 2012-09-06 ENCOUNTER — Encounter: Payer: Self-pay | Admitting: Obstetrics and Gynecology

## 2012-09-06 ENCOUNTER — Ambulatory Visit (INDEPENDENT_AMBULATORY_CARE_PROVIDER_SITE_OTHER): Payer: BC Managed Care – PPO | Admitting: Obstetrics and Gynecology

## 2012-09-06 VITALS — BP 116/70 | Wt 209.0 lb

## 2012-09-06 DIAGNOSIS — F411 Generalized anxiety disorder: Secondary | ICD-10-CM

## 2012-09-06 DIAGNOSIS — N979 Female infertility, unspecified: Secondary | ICD-10-CM

## 2012-09-06 DIAGNOSIS — N970 Female infertility associated with anovulation: Secondary | ICD-10-CM

## 2012-09-06 DIAGNOSIS — F419 Anxiety disorder, unspecified: Secondary | ICD-10-CM

## 2012-09-06 MED ORDER — CLOMIPHENE CITRATE 50 MG PO TABS: ORAL_TABLET | ORAL | Status: DC

## 2012-09-06 NOTE — Patient Instructions (Signed)
Increase clomid to 100mg  (2 tablets) daily days 3-7 of next cycle.  Call with next cycle to make day 28-32 appt.

## 2012-09-06 NOTE — Progress Notes (Signed)
INFERTILITY FOLLOWUP  Subjective:    Leslie [name] is a 36 y.o. female G1P0010 who presents with a diagnosis of  ovulation factor for F/U.  Pt states she and husband had intercourse only 2 times last cycle because he was having a "hard time" and  Couldn't "do anything".  She states he wants a urology referral.  Current medication: CLOMID 50 mg daily days 3-7  Objective:   Infertility laboratory results have been updated and/or reviewed:  and are remarkable for:Day 21 progesterone 11    Wt 209 lb (94.802 kg)  LMP 08/11/2012  Breastfeeding? No  Wt Readings from Last 1 Encounters:  09/06/12 209 lb (94.802 kg)    BMI: There is no height on file to calculate BMI.  Pelvic exam:   Normal external genitalia    Normal uterus    Normal adnexa   Exam compromised by pt habitus and anxiety  Assessment:   Infertility Oligo-ovulation Female factor with Erectile dysfunction per pt Plan:  Pt requests urology referral for her husband Increase clomid to 100 mg daily days 3-7 Timed intercourse May need IUI if ED persists Follow-up:  Days 28-32 of next cycle  Dierdre Forth MD 9/23/20135:18 PM

## 2012-09-08 ENCOUNTER — Telehealth: Payer: Self-pay

## 2012-09-08 NOTE — Telephone Encounter (Signed)
Tc to pt. Informed pt needs spouse information to complete urology referral. Pt opts to cb if decides to have referral done due to personal reasons. Pt agrees.

## 2012-09-08 NOTE — Telephone Encounter (Signed)
Message copied by Raylene Everts on Wed Sep 08, 2012 10:56 AM ------      Message from: Dierdre Forth P      Created: Mon Sep 06, 2012  6:05 PM       Husband needs urology referral.  DX:  Infertility with anovulation.  Reports erectile dysfunction

## 2012-09-13 ENCOUNTER — Telehealth: Payer: Self-pay | Admitting: Family Medicine

## 2012-09-13 NOTE — Telephone Encounter (Signed)
Pt called and said that she was a pt of Dr Caryl Never, but has been seeing Dr Arthur Holms for about a yr but is req to change back to Dr Caryl Never as pcp.

## 2012-09-14 NOTE — Telephone Encounter (Signed)
Please advise 

## 2012-09-14 NOTE — Telephone Encounter (Signed)
NO.  We cannot keep changing physicians. We need to make sure her primary is changed back to Dr Debby Bud since he has been seeing her.

## 2012-09-14 NOTE — Telephone Encounter (Signed)
Holly, please assist with this and thank you.

## 2012-09-14 NOTE — Telephone Encounter (Signed)
Patient est w/Dr. Debby Bud 05/01/2011. PCP banner has been changed to reflect that, and noted. Okey Regal, please call pt back and let her know that per Dr. Caryl Never, she has est w/Dr. Debby Bud, and Dr. Caryl Never will not agree to another transfer.

## 2012-09-14 NOTE — Telephone Encounter (Signed)
Called pt and notified her that Dr Caryl Never has declined pts req to re-est.

## 2012-09-27 ENCOUNTER — Telehealth: Payer: Self-pay | Admitting: Internal Medicine

## 2012-09-27 NOTE — Telephone Encounter (Signed)
Patient is requesting to switch from Dr Debby Bud to another physician because he made the comment that "she was one of his youngest patients" and now she feels that she should see a doctor that sees patients that are more her age, would you be willing to accept this patient (see phone note from 09/13/2012 as well)

## 2012-09-27 NOTE — Telephone Encounter (Signed)
ok 

## 2012-09-28 NOTE — Telephone Encounter (Signed)
1. My youngest patient is in their early 94's 2. See her record with MD changes, urgent care visits, etc 3. Fine with me for her to change to a "Younger" doctor

## 2012-10-01 ENCOUNTER — Telehealth: Payer: Self-pay | Admitting: Obstetrics and Gynecology

## 2012-10-01 NOTE — Telephone Encounter (Signed)
Lm on vm to cb per telephone call.  

## 2012-10-04 ENCOUNTER — Telehealth: Payer: Self-pay | Admitting: Obstetrics and Gynecology

## 2012-10-05 NOTE — Telephone Encounter (Signed)
Lm on vm to cb per telephone call.  

## 2012-10-07 NOTE — Telephone Encounter (Signed)
10-06-12. Tc from pt. LMP-10-06-12. Pt wants to proceed with referral for spouse to urologist for eval of ? ED. Pt informed to start Clomid 100mg  day 3-7 of cycle per [facility] recs. Pt needs follow up appt in 12/2012 if no conception. Pt voices understanding. Will call pt with urology appt once referral appt made. Pt voices understanding.

## 2012-10-10 ENCOUNTER — Ambulatory Visit: Payer: BC Managed Care – PPO

## 2012-10-10 ENCOUNTER — Ambulatory Visit (INDEPENDENT_AMBULATORY_CARE_PROVIDER_SITE_OTHER): Payer: BC Managed Care – PPO | Admitting: Internal Medicine

## 2012-10-10 VITALS — BP 142/86 | HR 72 | Temp 98.3°F | Resp 16 | Ht 70.0 in | Wt 206.4 lb

## 2012-10-10 DIAGNOSIS — S96911A Strain of unspecified muscle and tendon at ankle and foot level, right foot, initial encounter: Secondary | ICD-10-CM

## 2012-10-10 DIAGNOSIS — S93519A Sprain of interphalangeal joint of unspecified toe(s), initial encounter: Secondary | ICD-10-CM

## 2012-10-10 DIAGNOSIS — M79671 Pain in right foot: Secondary | ICD-10-CM

## 2012-10-10 DIAGNOSIS — M79609 Pain in unspecified limb: Secondary | ICD-10-CM

## 2012-10-10 NOTE — Progress Notes (Signed)
  Subjective:    Patient ID: Leslie [name], female    DOB: [birth year], 36 y.o.   MRN: [id number]  HPI Running in dark in yard and tripped over root of tree. Had immediate pain right great toe, mostly at 1st MTPJ   Review of Systems     Objective:   Physical Exam Right foot no swelling seen NMVS intact Ankle has full rom  [facility] reading (PRIMARY) by  Dr.Genna Casimir no fx seen       Assessment & Plan:  RICE Camwalker

## 2012-10-10 NOTE — Patient Instructions (Addendum)
Turf Toe with Rehab Injury to the base of the big toe (first metatarsal phalangeal joint) that causes damage to the joint capsule and ligaments is known as turf toe. Turf toe commonly occurs on the bottom side of the joint. SYMPTOMS   Pain, tenderness, inflammation and/or bruising around the big toe (contusion).  Pain that worsens with movement of the big toe, specifically when raising (extending) the toe.  Inability to walk properly on the affected foot, which causes one to limp. CAUSES  A force being placed on the joint capsule and ligaments that is greater than they can withstand causes turf toe. Common mechanisms of injury include:  Repetitive and/or strenuous extension of the big toe (ie. standing on tip toes).  Explosive running starts (ie. sprinters).  "Stubbing" the big toe.  Another player landing on your foot. RISK INCREASES WITH:  Previous toe injury.  Having a long first toe.  Flat feet.  Arthritis of the great toe.  Improperly fitted shoes or shoes that are not appropriate for a given activity.  Family history of foot abnormalities.  Activities that involve explosive running starts, standing on tip toes, or jumping. PREVENTION  Wear properly fitted shoes that are appropriate for the sport or activity.  Protect the first toe by taping it to reduce motion.  Maintain physical fitness:  Strength, flexibility, and endurance.  Cardiovascular fitness. PROGNOSIS  If treated properly, the symptoms of turf toes usually resolve with non-surgical (conservative) treatment. Occasionally surgery is necessary. RELATED COMPLICATIONS  Recurrent symptoms that result in a chronic problem.  Inability to compete in athletics.  Prolonged healing time, if improperly treated or re-injured.  Other foot injuries that occur due to protecting the first toe from pain.  Loss of motion in the first toe (hallux rigidus).  Bunion (hallux valgus). TREATMENT  Treatment  initially involves resting from any activities that aggravate the symptoms, and the use of ice and medications to help reduce pain and inflammation. The use of range-of-motion exercises may help reduce pain with activity. It is important that you wear properly fitted shoes with a stiff sole and a wide toe box, in order to reduce the pressure on the first toe. Protecting your big toe by taping it to restrict movement may allow you to return to sports earlier without pain or discomfort. If the condition becomes chronic, then your caregiver may recommend a corticosteroid injection to help reduce inflammation. If symptoms persist despite non-surgical treatment, then surgery may be recommended. MEDICATION  If pain medication is necessary, then nonsteroidal anti-inflammatory medications, such as aspirin and ibuprofen, or other minor pain relievers, such as acetaminophen, are often recommended.  Do not take pain medication for 7 days before surgery.  Prescription pain relievers may be given if deemed necessary by your caregiver. Use only as directed and only as much as you need.  Ointments applied to the skin may be helpful.  Corticosteroid injections may be given by your caregiver. These injections should be reserved for the most serious cases, because they may only be given a certain number of times. HEAT AND COLD  Cold treatment (icing) relieves pain and reduces inflammation. Cold treatment should be applied for 10 to 15 minutes every 2 to 3 hours for inflammation and pain and immediately after any activity that aggravates your symptoms. Use ice packs or massage the area with a piece of ice (ice massage).  Heat treatment may be used prior to performing the stretching and strengthening activities prescribed by your caregiver, physical therapist,  or Event organiser. Use a heat pack or soak the injury in warm water. SEEK MEDICAL CARE IF:  Treatment seems to offer no benefit, or the condition  worsens.  Any medications produce adverse side effects. EXERCISES RANGE OF MOTION (ROM) AND STRETCHING EXERCISES - Turf Toe These exercises may help you when beginning to rehabilitate your injury. Your symptoms may resolve with or without further involvement from your physician, physical therapist or athletic trainer. While completing these exercises, remember:  Restoring tissue flexibility helps normal motion to return to the joints. This allows healthier, less painful movement and activity.  An effective stretch should be held for at least 30 seconds.  A stretch should never be painful. You should only feel a gentle lengthening or release in the stretched tissue. RANGE OF MOTION - Toe Extension, Flexion  Sit with your right / left leg crossed over your opposite knee.  Grasp your toes and gently pull them back toward the top of your foot. You should feel a stretch on the bottom of your toes and/or foot.  Hold this stretch for __________ seconds.  Now, gently pull your toes toward the bottom of your foot. You should feel a stretch on the top of your toes and or foot.  Hold this stretch for __________ seconds. Repeat __________ times. Complete this stretch __________ times per day. RANGE OF MOTION- Ankle Plantar Flexion  Sit with your right / left leg crossed over your opposite knee.  Use your opposite hand to pull the top of your foot and toes toward you.  You should feel a gentle stretch on the top of your foot/ankle. Hold this position for __________ seconds. Repeat __________ times. Complete __________ times per day. STRENGTHENING EXERCISES - Turf Toe These exercises may help you when beginning to rehabilitate your injury. They may resolve your symptoms with or without further involvement from your physician, physical therapist or athletic trainer. While completing these exercises, remember:  Muscles can gain both the endurance and the strength needed for everyday activities  through controlled exercises.  Complete these exercises as instructed by your physician, physical therapist or athletic trainer. Progress with the resistance and repetition exercises only as your caregiver advises.  You may experience muscle soreness or fatigue, but the pain or discomfort you are trying to eliminate should never worsen during these exercises. If this pain does worsen, stop and make certain you are following the directions exactly. If the pain is still present after adjustments, discontinue the exercise until you can discuss the trouble with your clinician. STRENGTH - Towel Curls  Sit in a chair positioned on a non-carpeted surface.  Place your foot on a towel, keeping your heel on the floor.  Pull the towel toward your heel by only curling your toes. Keep your heel on the floor.  If instructed by your physician, physical therapist or athletic trainer, add ____________________ at the end of the towel. Repeat __________ times. Complete this exercise __________ times per day. Document Released: 12/01/2005 Document Revised: 02/23/2012 Document Reviewed: 03/15/2009 [facility] Patient Information 2013 [place], [place].

## 2012-10-11 ENCOUNTER — Encounter: Payer: Self-pay | Admitting: Radiology

## 2012-10-14 ENCOUNTER — Telehealth: Payer: Self-pay | Admitting: Obstetrics and Gynecology

## 2012-10-14 MED ORDER — PRENAISSANCE 29-1.25-325 MG PO CAPS: 1.0000 | ORAL_CAPSULE | Freq: Every day | ORAL | Status: DC

## 2012-10-14 NOTE — Telephone Encounter (Signed)
Tc to pt. Rx for [facility] e-pres to pharm on file. Pt agrees.

## 2012-11-24 ENCOUNTER — Telehealth: Payer: Self-pay

## 2012-11-24 NOTE — Telephone Encounter (Signed)
Patient called [facility] twice stating that due to family issues " Dad on life support" she would like a RX for valium. Called pt [facility] advising that per system she requested to switch from Dr Debby Bud to Dr Yetta Barre on 09/27/12, however she has not seen/established with Dr. Yetta Barre yet. Patient was last seen by Dr. Debby Bud on 04/07/12 and due to pt has never seen Dr. Yetta Barre he can not prescribe any medications.

## 2012-12-16 ENCOUNTER — Ambulatory Visit: Payer: BC Managed Care – PPO | Admitting: Internal Medicine

## 2013-01-05 ENCOUNTER — Ambulatory Visit (INDEPENDENT_AMBULATORY_CARE_PROVIDER_SITE_OTHER): Payer: BC Managed Care – PPO | Admitting: Internal Medicine

## 2013-01-05 ENCOUNTER — Encounter: Payer: Self-pay | Admitting: Internal Medicine

## 2013-01-05 ENCOUNTER — Other Ambulatory Visit (INDEPENDENT_AMBULATORY_CARE_PROVIDER_SITE_OTHER): Payer: BC Managed Care – PPO

## 2013-01-05 VITALS — BP 138/98 | HR 86 | Temp 98.4°F | Resp 16 | Ht 70.0 in | Wt 211.0 lb

## 2013-01-05 DIAGNOSIS — Z3490 Encounter for supervision of normal pregnancy, unspecified, unspecified trimester: Secondary | ICD-10-CM

## 2013-01-05 DIAGNOSIS — N912 Amenorrhea, unspecified: Secondary | ICD-10-CM

## 2013-01-05 DIAGNOSIS — R7309 Other abnormal glucose: Secondary | ICD-10-CM

## 2013-01-05 DIAGNOSIS — Z331 Pregnant state, incidental: Secondary | ICD-10-CM

## 2013-01-05 DIAGNOSIS — I10 Essential (primary) hypertension: Secondary | ICD-10-CM

## 2013-01-05 DIAGNOSIS — D649 Anemia, unspecified: Secondary | ICD-10-CM

## 2013-01-05 LAB — CBC WITH DIFFERENTIAL/PLATELET
Eosinophils Absolute: 0 10*3/uL (ref 0.0–0.7)
Eosinophils Relative: 0.5 % (ref 0.0–5.0)
Lymphocytes Relative: 24.6 % (ref 12.0–46.0)
MCV: 84 fl (ref 78.0–100.0)
Monocytes Absolute: 0.4 10*3/uL (ref 0.1–1.0)
Neutrophils Relative %: 66.8 % (ref 43.0–77.0)
Platelets: 255 10*3/uL (ref 150.0–400.0)
WBC: 5.7 10*3/uL (ref 4.5–10.5)

## 2013-01-05 LAB — URINALYSIS, ROUTINE W REFLEX MICROSCOPIC
Bilirubin Urine: NEGATIVE
Ketones, ur: NEGATIVE
Nitrite: NEGATIVE
Total Protein, Urine: NEGATIVE
Urine Glucose: NEGATIVE
pH: 6 (ref 5.0–8.0)

## 2013-01-05 LAB — COMPREHENSIVE METABOLIC PANEL
AST: 15 U/L (ref 0–37)
Albumin: 4.1 g/dL (ref 3.5–5.2)
Alkaline Phosphatase: 52 U/L (ref 39–117)
Glucose, Bld: 88 mg/dL (ref 70–99)
Potassium: 3.4 mEq/L — ABNORMAL LOW (ref 3.5–5.1)
Sodium: 137 mEq/L (ref 135–145)
Total Protein: 7.9 g/dL (ref 6.0–8.3)

## 2013-01-05 LAB — HEMOGLOBIN A1C: Hgb A1c MFr Bld: 5.9 % (ref 4.6–6.5)

## 2013-01-05 MED ORDER — LABETALOL HCL 200 MG PO TABS: 200.0000 mg | ORAL_TABLET | Freq: Two times a day (BID) | ORAL | Status: DC

## 2013-01-05 NOTE — Assessment & Plan Note (Signed)
CBC and iron level today.

## 2013-01-05 NOTE — Progress Notes (Signed)
  Subjective:    Patient ID: Leslie [name], female    DOB: 1976-08-20, 37 y.o.   MRN: [id number]  Hypertension This is a chronic problem. The current episode started more than 1 year ago. The problem has been gradually worsening since onset. The problem is uncontrolled. Pertinent negatives include no anxiety, blurred vision, chest pain, headaches, malaise/fatigue, neck pain, orthopnea, palpitations, peripheral edema, PND, shortness of breath or sweats. Risk factors for coronary artery disease include obesity. Past treatments include beta blockers. Compliance problems include exercise and diet.       Review of Systems  Constitutional: Negative for fever, chills, malaise/fatigue, diaphoresis, activity change, appetite change, fatigue and unexpected weight change.  HENT: Negative.  Negative for neck pain.   Eyes: Negative.  Negative for blurred vision.  Respiratory: Negative for cough, chest tightness, shortness of breath, wheezing and stridor.   Cardiovascular: Negative for chest pain, palpitations, orthopnea, leg swelling and PND.  Gastrointestinal: Negative for nausea, vomiting, abdominal pain, diarrhea and constipation.  Genitourinary: Positive for frequency. Negative for dysuria, urgency, hematuria, flank pain, decreased urine volume and difficulty urinating.  Musculoskeletal: Negative.   Skin: Negative.   Neurological: Negative for dizziness, facial asymmetry, weakness, light-headedness and headaches.  Hematological: Negative for adenopathy. Does not bruise/bleed easily.  Psychiatric/Behavioral: Negative.        Objective:   Physical Exam  Vitals reviewed. Constitutional: She is oriented to person, place, and time. She appears well-developed and well-nourished. No distress.  HENT:  Head: Normocephalic and atraumatic.  Mouth/Throat: Oropharynx is clear and moist. No oropharyngeal exudate.  Eyes: Conjunctivae normal are normal. Right eye exhibits no discharge. Left eye exhibits no  discharge. No scleral icterus.  Neck: Normal range of motion. Neck supple. No JVD present. No tracheal deviation present. No thyromegaly present.  Cardiovascular: Normal rate, regular rhythm, normal heart sounds and intact distal pulses.  Exam reveals no gallop and no friction rub.   No murmur heard. Pulmonary/Chest: Effort normal and breath sounds normal. No stridor. No respiratory distress. She has no wheezes. She has no rales. She exhibits no tenderness.  Abdominal: Soft. Bowel sounds are normal. She exhibits no distension and no mass. There is no tenderness. There is no rebound and no guarding.  Musculoskeletal: Normal range of motion. She exhibits no edema and no tenderness.  Lymphadenopathy:    She has no cervical adenopathy.  Neurological: She is oriented to person, place, and time.  Skin: Skin is warm and dry. No rash noted. She is not diaphoretic. No erythema. No pallor.  Psychiatric: She has a normal mood and affect. Her behavior is normal. Judgment and thought content normal.      Lab Results  Component Value Date   WBC 8.0 08/31/2007   HGB 11.2* 08/31/2007   HCT 32.5* 08/31/2007   PLT 312 08/31/2007   GLUCOSE 106* 11/02/2007   ALT 22 08/31/2007   AST 23 08/31/2007   NA 138 11/02/2007   K 3.6 11/02/2007   CL 103 11/02/2007   CREATININE 0.7 11/02/2007   BUN 12 11/02/2007   CO2 24 11/02/2007   TSH 1.917 07/12/2012   HGBA1C 6.2 09/01/2007      Assessment & Plan:

## 2013-01-05 NOTE — Assessment & Plan Note (Signed)
I will check her a1c to see if she has developed DM II 

## 2013-01-05 NOTE — Patient Instructions (Signed)

## 2013-01-05 NOTE — Assessment & Plan Note (Signed)
I will check her beta-HCG to see if she is pregnant

## 2013-01-05 NOTE — Assessment & Plan Note (Signed)
Her BP is not well controlled so I have asked her to change to labetalol Today I will check her labs to look for secondary causes of HTN and to look for end organ damage

## 2013-01-06 DIAGNOSIS — Z3490 Encounter for supervision of normal pregnancy, unspecified, unspecified trimester: Secondary | ICD-10-CM | POA: Insufficient documentation

## 2013-01-06 NOTE — Addendum Note (Signed)
Addended by: Etta Grandchild on: 01/06/2013 07:46 AM   Modules accepted: Orders

## 2013-01-10 ENCOUNTER — Encounter: Payer: Self-pay | Admitting: Obstetrics and Gynecology

## 2013-01-10 ENCOUNTER — Other Ambulatory Visit: Payer: Self-pay | Admitting: Obstetrics and Gynecology

## 2013-01-10 ENCOUNTER — Telehealth: Payer: Self-pay | Admitting: Obstetrics and Gynecology

## 2013-01-10 ENCOUNTER — Ambulatory Visit: Payer: BC Managed Care – PPO

## 2013-01-10 ENCOUNTER — Ambulatory Visit: Payer: BC Managed Care – PPO | Admitting: Obstetrics and Gynecology

## 2013-01-10 VITALS — BP 122/82 | Resp 14 | Wt 208.0 lb

## 2013-01-10 DIAGNOSIS — R109 Unspecified abdominal pain: Secondary | ICD-10-CM

## 2013-01-10 DIAGNOSIS — O26899 Other specified pregnancy related conditions, unspecified trimester: Secondary | ICD-10-CM

## 2013-01-10 DIAGNOSIS — Z331 Pregnant state, incidental: Secondary | ICD-10-CM

## 2013-01-10 LAB — ABO AND RH: Rh Type: POSITIVE

## 2013-01-10 LAB — US OB TRANSVAGINAL

## 2013-01-10 LAB — HCG, QUANTITATIVE, PREGNANCY: hCG, Beta Chain, Quant, S: 7.2 m[IU]/mL

## 2013-01-10 NOTE — Progress Notes (Signed)
S: pt is a G2P0 at [redacted]w[redacted]d by LMP of 12/01/2012 She began having heavy vaginal bleeding last evening and then passing several large clots today, states has had some cramping but is mild and tylenol has helped.  O: VSS Attempted to perform pelvic exam and pt was not able to tolerate digitally or with a speculum, mod amt bright red blood on perineum  [place] - anteverted uterus, complex endometrium, no IUP, ROV no visualized, LOV appears normal   A: probable SAB in progress  P: quant and ABO drawn today,  Quant on 01/05/13 was 49 (drawn at primary care) Will repeat quant on Wednesday and f/u Dr Pennie Rushing in 2wks rv'd bleeding and ectopic precautions

## 2013-01-10 NOTE — Progress Notes (Signed)
  1st trimester bleeding ultrasound: anteverted uterus, complex endometrium, no IUP is visualized  R is not visualized on neither transvaginal or transabdominal scan, L ovary appears normal   Current contraception: none / pregnant Hormone replacement therapy: No New medication: No  History of WJX:BJYN  History of infertility: no History of abnormal Pap smear: no History of fibroids: No  Increased stress: No  Abnormal bleeding pattern started:  Yesterday per pt

## 2013-01-10 NOTE — Telephone Encounter (Signed)
Pt called, states lmp 12/01/12, had + UPT on 01/05/13.  Pt started with some bright red bleeding and cramping, cramping was more pronounced on left side last pm, but today is more lower abdominal pain.  Pt denies changing a soaked pad Q hour.  Pt scheduled an appt today w/ SL @ 1630 for eval.  Pt advised if bleeding increases to call back prior to appt.

## 2013-01-11 ENCOUNTER — Telehealth: Payer: Self-pay

## 2013-01-11 ENCOUNTER — Other Ambulatory Visit: Payer: Self-pay

## 2013-01-11 ENCOUNTER — Ambulatory Visit (HOSPITAL_COMMUNITY)
Admission: RE | Admit: 2013-01-11 | Discharge: 2013-01-11 | Disposition: A | Discharge status: Home / Self Care | Payer: BC Managed Care – PPO | Source: Ambulatory Visit | Attending: Obstetrics and Gynecology | Admitting: Obstetrics and Gynecology

## 2013-01-11 DIAGNOSIS — N939 Abnormal uterine and vaginal bleeding, unspecified: Secondary | ICD-10-CM

## 2013-01-11 NOTE — Telephone Encounter (Signed)
Tc from pt per SL recs. Appt sched 01/12/13 @ 12:30p with lab only for [facility] and fu appt sched 01/25/13 @ 4:15 with [facility]. Pt agrees.

## 2013-01-11 NOTE — Telephone Encounter (Signed)
Lm on vm to cb per SL recs

## 2013-01-11 NOTE — Telephone Encounter (Signed)
Message copied by Raylene Everts on Tue Jan 11, 2013  8:26 AM ------      Message from: Malissa Hippo.      Created: Mon Jan 10, 2013  6:37 PM      Regarding: please call pt to f/u quant        I saw this pt today for vag bleeding, she was [redacted]w[redacted]d by dates, but US showed no IUP, probable SAB            Quant and ABO was drawn today 1-27            I talked to Dr Pennie Rushing and she would like her to come in on Wednesday for a repeat quant and then a f/u visit w Dr Pennie Rushing in 2wks.             Thanks!      SL

## 2013-01-12 ENCOUNTER — Other Ambulatory Visit: Payer: BC Managed Care – PPO

## 2013-01-12 DIAGNOSIS — O039 Complete or unspecified spontaneous abortion without complication: Secondary | ICD-10-CM

## 2013-01-12 NOTE — Progress Notes (Signed)
Quant today per [facility]. Probable SAB per SL.  ld

## 2013-01-13 ENCOUNTER — Telehealth: Payer: Self-pay | Admitting: Obstetrics and Gynecology

## 2013-01-14 NOTE — Telephone Encounter (Signed)
01/13/13-Tc to pt per test results. Told pt QHCG=negative. Pt informed to keep follow up appt with [facility] on 01/25/13. Pt agrees.

## 2013-01-21 ENCOUNTER — Encounter (HOSPITAL_COMMUNITY): Payer: Self-pay

## 2013-01-21 ENCOUNTER — Emergency Department (HOSPITAL_COMMUNITY)
Admission: EM | Admit: 2013-01-21 | Discharge: 2013-01-21 | Disposition: A | Discharge status: Home / Self Care | Payer: BC Managed Care – PPO | Source: Home / Self Care | Attending: Internal Medicine | Admitting: Internal Medicine

## 2013-01-21 DIAGNOSIS — F411 Generalized anxiety disorder: Secondary | ICD-10-CM

## 2013-01-21 MED ORDER — ALPRAZOLAM 0.25 MG PO TABS: 0.2500 mg | ORAL_TABLET | Freq: Every evening | ORAL | Status: DC | PRN

## 2013-01-21 NOTE — ED Notes (Signed)
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 On self prescribed stratera trihexphenidyl
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
## 2013-01-25 ENCOUNTER — Encounter: Payer: Self-pay | Admitting: Obstetrics and Gynecology

## 2013-01-25 ENCOUNTER — Ambulatory Visit: Payer: BC Managed Care – PPO | Admitting: Obstetrics and Gynecology

## 2013-01-25 VITALS — BP 120/90 | Temp 97.9°F | Ht 70.0 in | Wt 210.0 lb

## 2013-01-25 DIAGNOSIS — O039 Complete or unspecified spontaneous abortion without complication: Secondary | ICD-10-CM

## 2013-01-25 DIAGNOSIS — N970 Female infertility associated with anovulation: Secondary | ICD-10-CM

## 2013-01-25 NOTE — Progress Notes (Signed)
GYN PROBLEM VISIT  Ms. Leslie [name] is a 37 y.o. year old female,G2P0010, who presents for a problem visit. She suffered a documented SAB with decrease in QHCG to <2 two weeks ago  Subjective: Denies vaginal bleeding or pain.  No nausea or vomiting   Objective:  LMP 12/01/2012  BP 120/90  Temp(Src) 97.9 F (36.6 C)  Ht 5\' 10"  (1.778 m)  Wt 210 lb (95.255 kg)  BMI 30.13 kg/m2  LMP 12/01/2012   GI: soft, non-tender; bowel sounds normal; no masses,  no organomegaly  Vulva and vagina appear normal. Bimanual exam reveals normal uterus and adnexa. 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  Pt declines bloodwork for now Reading info re workup of recurrent miscarriage given Pt wants to try Clomid again.  Rec she wait until April menses to start Clomid 100mg  on days3-7 Pt can call at any time that she wants to proceed with workup for a lab appt for Anticardiolipin antibodies            Lupus anticoagulant            Protein c&s Will plan assessment of progesterone with onset of pregnancy and offer progesterone support if pt desires. Return to office in 3 month(s).   Dierdre Forth, MD  01/25/2013 4:39 PM

## 2013-01-26 ENCOUNTER — Encounter: Payer: BC Managed Care – PPO | Admitting: Obstetrics and Gynecology

## 2013-02-03 ENCOUNTER — Encounter: Payer: BC Managed Care – PPO | Admitting: Obstetrics and Gynecology

## 2013-02-14 ENCOUNTER — Other Ambulatory Visit: Payer: Self-pay | Admitting: Obstetrics and Gynecology

## 2013-02-14 ENCOUNTER — Other Ambulatory Visit: Payer: BC Managed Care – PPO

## 2013-02-14 ENCOUNTER — Ambulatory Visit (INDEPENDENT_AMBULATORY_CARE_PROVIDER_SITE_OTHER): Payer: BC Managed Care – PPO | Admitting: Internal Medicine

## 2013-02-14 ENCOUNTER — Encounter: Payer: Self-pay | Admitting: Internal Medicine

## 2013-02-14 ENCOUNTER — Encounter: Payer: Self-pay | Admitting: Obstetrics and Gynecology

## 2013-02-14 VITALS — BP 134/98 | HR 84 | Temp 98.1°F | Ht 70.0 in | Wt 209.0 lb

## 2013-02-14 DIAGNOSIS — O2620 Pregnancy care for patient with recurrent pregnancy loss, unspecified trimester: Secondary | ICD-10-CM

## 2013-02-14 DIAGNOSIS — T148XXA Other injury of unspecified body region, initial encounter: Secondary | ICD-10-CM

## 2013-02-14 DIAGNOSIS — S5401XA Injury of ulnar nerve at forearm level, right arm, initial encounter: Secondary | ICD-10-CM

## 2013-02-14 DIAGNOSIS — S5400XA Injury of ulnar nerve at forearm level, unspecified arm, initial encounter: Secondary | ICD-10-CM

## 2013-02-14 DIAGNOSIS — W503XXA Accidental bite by another person, initial encounter: Secondary | ICD-10-CM

## 2013-02-14 MED ORDER — PREDNISONE 10 MG PO TABS: ORAL_TABLET | ORAL | Status: DC
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
## 2013-02-14 NOTE — Patient Instructions (Signed)
Cellulitis Cellulitis is an infection of the skin and the tissue beneath it. The infected area is usually red and tender. Cellulitis occurs most often in the arms and lower legs.   CAUSES   Cellulitis is caused by bacteria that enter the skin through cracks or cuts in the skin. The most common types of bacteria that cause cellulitis are Staphylococcus and Streptococcus. SYMPTOMS    Redness and warmth.   Swelling.   Tenderness or pain.   Fever.  DIAGNOSIS  Your caregiver can usually determine what is wrong based on a physical exam. Blood tests may also be done. TREATMENT   Treatment usually involves taking an antibiotic medicine. HOME CARE INSTRUCTIONS    Take your antibiotics as directed. Finish them even if you start to feel better.   Keep the infected arm or leg elevated to reduce swelling.   Apply a warm cloth to the affected area up to 4 times per day to relieve pain.   Only take over-the-counter or prescription medicines for pain, discomfort, or fever as directed by your caregiver.   Keep all follow-up appointments as directed by your caregiver.  SEEK MEDICAL CARE IF:    You notice red streaks coming from the infected area.   Your red area gets larger or turns dark in color.   Your bone or joint underneath the infected area becomes painful after the skin has healed.   Your infection returns in the same area or another area.   You notice a swollen bump in the infected area.   You develop new symptoms.  SEEK IMMEDIATE MEDICAL CARE IF:    You have a fever.   You feel very sleepy.   You develop vomiting or diarrhea.   You have a general ill feeling (malaise) with muscle aches and pains.  MAKE SURE YOU:    Understand these instructions.   Will watch your condition.   Will get help right away if you are not doing well or get worse.  Document Released: 09/10/2005 Document Revised: 06/01/2012 Document Reviewed: 02/16/2012 [employer] Patient Information 2013  [employer].    

## 2013-02-14 NOTE — Progress Notes (Signed)
Pt presented to office to lab. Ordered per plan of DR [facility] 01/25/13.

## 2013-02-15 LAB — LUPUS ANTICOAGULANT PANEL: Lupus Anticoagulant: NOT DETECTED

## 2013-02-15 LAB — CARDIOLIPIN ANTIBODIES, IGM+IGG: Anticardiolipin IgG: 1 GPL U/mL (ref <23)

## 2013-03-06 ENCOUNTER — Telehealth: Payer: Self-pay

## 2013-03-06 NOTE — Telephone Encounter (Signed)
Pt dropped off a hemosure for the lab to run but I could not find an order or where she had been seen in [facility] or medman. I called pt and she says that she called [place] yesterday requesting a hemosure for her insurance company and we gave her one. I told her that I could not result it with out her seeing a provider

## 2013-10-20 ENCOUNTER — Other Ambulatory Visit: Payer: Self-pay

## 2013-12-15 DIAGNOSIS — O039 Complete or unspecified spontaneous abortion without complication: Secondary | ICD-10-CM

## 2013-12-15 HISTORY — DX: Complete or unspecified spontaneous abortion without complication: O03.9

## 2014-01-22 ENCOUNTER — Ambulatory Visit (INDEPENDENT_AMBULATORY_CARE_PROVIDER_SITE_OTHER): Payer: BC Managed Care – PPO | Admitting: Internal Medicine

## 2014-01-22 VITALS — BP 138/70 | HR 70 | Temp 98.9°F | Resp 16 | Ht 70.0 in | Wt 204.0 lb

## 2014-01-22 DIAGNOSIS — M549 Dorsalgia, unspecified: Secondary | ICD-10-CM

## 2014-01-22 DIAGNOSIS — M545 Low back pain, unspecified: Secondary | ICD-10-CM

## 2014-01-22 DIAGNOSIS — Z0000 Encounter for general adult medical examination without abnormal findings: Secondary | ICD-10-CM

## 2014-01-22 DIAGNOSIS — Z23 Encounter for immunization: Secondary | ICD-10-CM

## 2014-01-22 MED ORDER — METOPROLOL SUCCINATE ER 50 MG PO TB24: 50.0000 mg | ORAL_TABLET | Freq: Every day | ORAL | Status: DC
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
## 2014-01-26 ENCOUNTER — Encounter: Payer: Self-pay | Admitting: Internal Medicine

## 2014-01-26 ENCOUNTER — Ambulatory Visit (INDEPENDENT_AMBULATORY_CARE_PROVIDER_SITE_OTHER): Payer: BC Managed Care – PPO | Admitting: Internal Medicine

## 2014-01-26 VITALS — BP 160/100 | HR 91 | Temp 98.2°F | Resp 16 | Ht 70.0 in | Wt 207.0 lb

## 2014-01-26 DIAGNOSIS — E876 Hypokalemia: Secondary | ICD-10-CM

## 2014-01-26 DIAGNOSIS — I10 Essential (primary) hypertension: Secondary | ICD-10-CM

## 2014-01-26 MED ORDER — HYDROCHLOROTHIAZIDE 12.5 MG PO CAPS: 12.5000 mg | ORAL_CAPSULE | Freq: Every day | ORAL | Status: DC

## 2014-01-26 NOTE — Assessment & Plan Note (Signed)
Her BP is not well controlled I will check her labs today to look for secondary causes of HTN and to screen for end organ damage She will cont taking metoprolol and will add HCTX for better BP control

## 2014-01-26 NOTE — Progress Notes (Signed)
Pre visit review using our clinic review tool, if applicable. No additional management support is needed unless otherwise documented below in the visit note. 

## 2014-01-26 NOTE — Patient Instructions (Signed)

## 2014-01-26 NOTE — Progress Notes (Signed)
   Subjective:    Patient ID: Leslie [name], female    DOB: 1976-06-29, 38 y.o.   MRN: [id number]  Hypertension This is a chronic problem. The current episode started more than 1 year ago. The problem is unchanged. The problem is uncontrolled. Pertinent negatives include no anxiety, blurred vision, chest pain, headaches, malaise/fatigue, neck pain, orthopnea, palpitations, peripheral edema, PND, shortness of breath or sweats. Agents associated with hypertension include amphetamines (She has been taking ? stimulant med from [place], don't know what it is). Past treatments include beta blockers. The current treatment provides mild improvement. Compliance problems include exercise and diet.       Review of Systems  Constitutional: Negative.  Negative for fever, chills, malaise/fatigue, diaphoresis, appetite change and fatigue.  HENT: Negative.   Eyes: Negative.  Negative for blurred vision.  Respiratory: Negative.  Negative for cough, choking, chest tightness, shortness of breath, wheezing and stridor.   Cardiovascular: Negative.  Negative for chest pain, palpitations, orthopnea, leg swelling and PND.  Gastrointestinal: Negative.  Negative for nausea, vomiting, abdominal pain, diarrhea, constipation and blood in stool.  Endocrine: Negative.   Genitourinary: Negative.  Negative for dysuria, urgency, frequency, hematuria and difficulty urinating.  Musculoskeletal: Negative.  Negative for neck pain.  Skin: Negative.   Allergic/Immunologic: Negative.   Neurological: Negative.  Negative for dizziness, tremors, facial asymmetry, weakness, light-headedness and headaches.  Hematological: Negative.  Negative for adenopathy. Does not bruise/bleed easily.  Psychiatric/Behavioral: Negative.        Objective:   Physical Exam  Vitals reviewed. Constitutional: She is oriented to person, place, and time. She appears well-developed and well-nourished. No distress.  HENT:  Head: Normocephalic and  atraumatic.  Mouth/Throat: Oropharynx is clear and moist. No oropharyngeal exudate.  Eyes: Conjunctivae are normal. Right eye exhibits no discharge. Left eye exhibits no discharge. No scleral icterus.  Neck: Normal range of motion. Neck supple. No JVD present. No tracheal deviation present. No thyromegaly present.  Cardiovascular: Normal rate, regular rhythm, normal heart sounds and intact distal pulses.  Exam reveals no gallop and no friction rub.   No murmur heard. Pulmonary/Chest: Effort normal and breath sounds normal. No stridor. No respiratory distress. She has no wheezes. She has no rales. She exhibits no tenderness.  Abdominal: Soft. Bowel sounds are normal. She exhibits no distension and no mass. There is no tenderness. There is no rebound and no guarding.  Musculoskeletal: Normal range of motion. She exhibits no edema and no tenderness.  Lymphadenopathy:    She has no cervical adenopathy.  Neurological: She is oriented to person, place, and time.  Skin: Skin is warm and dry. No rash noted. She is not diaphoretic. No erythema. No pallor.  Psychiatric: She has a normal mood and affect. Her behavior is normal. Judgment and thought content normal.      Lab Results  Component Value Date   WBC 5.7 01/05/2013   HGB 12.2 01/05/2013   HCT 36.6 01/05/2013   PLT 255.0 01/05/2013   GLUCOSE 88 01/05/2013   ALT 14 01/05/2013   AST 15 01/05/2013   NA 137 01/05/2013   K 3.4* 01/05/2013   CL 105 01/05/2013   CREATININE 0.7 01/05/2013   BUN 10 01/05/2013   CO2 24 01/05/2013   TSH 1.17 01/05/2013   HGBA1C 5.9 01/05/2013      Assessment & Plan:

## 2014-01-27 ENCOUNTER — Other Ambulatory Visit (INDEPENDENT_AMBULATORY_CARE_PROVIDER_SITE_OTHER): Payer: BC Managed Care – PPO

## 2014-01-27 DIAGNOSIS — I10 Essential (primary) hypertension: Secondary | ICD-10-CM

## 2014-01-27 LAB — CBC WITH DIFFERENTIAL/PLATELET
BASOS ABS: 0.1 10*3/uL (ref 0.0–0.1)
Basophils Relative: 1.4 % (ref 0.0–3.0)
EOS ABS: 0.1 10*3/uL (ref 0.0–0.7)
Eosinophils Relative: 1.5 % (ref 0.0–5.0)
HCT: 34.3 % — ABNORMAL LOW (ref 36.0–46.0)
Hemoglobin: 10.9 g/dL — ABNORMAL LOW (ref 12.0–15.0)
LYMPHS PCT: 24.3 % (ref 12.0–46.0)
Lymphs Abs: 1.7 10*3/uL (ref 0.7–4.0)
MCHC: 31.7 g/dL (ref 30.0–36.0)
MCV: 82.5 fl (ref 78.0–100.0)
MONO ABS: 0.4 10*3/uL (ref 0.1–1.0)
Monocytes Relative: 5.8 % (ref 3.0–12.0)
NEUTROS PCT: 67 % (ref 43.0–77.0)
Neutro Abs: 4.7 10*3/uL (ref 1.4–7.7)
PLATELETS: 346 10*3/uL (ref 150.0–400.0)
RBC: 4.15 Mil/uL (ref 3.87–5.11)
RDW: 16.8 % — AB (ref 11.5–14.6)
WBC: 7.1 10*3/uL (ref 4.5–10.5)

## 2014-01-27 LAB — COMPREHENSIVE METABOLIC PANEL
ALBUMIN: 4.1 g/dL (ref 3.5–5.2)
ALT: 14 U/L (ref 0–35)
AST: 15 U/L (ref 0–37)
Alkaline Phosphatase: 68 U/L (ref 39–117)
BUN: 14 mg/dL (ref 6–23)
CALCIUM: 9.3 mg/dL (ref 8.4–10.5)
CHLORIDE: 105 meq/L (ref 96–112)
CO2: 27 mEq/L (ref 19–32)
Creatinine, Ser: 0.8 mg/dL (ref 0.4–1.2)
GFR: 102.85 mL/min (ref >60.00)
Glucose, Bld: 87 mg/dL (ref 70–99)
POTASSIUM: 3.3 meq/L — AB (ref 3.5–5.1)
SODIUM: 140 meq/L (ref 135–145)
TOTAL PROTEIN: 7.9 g/dL (ref 6.0–8.3)
Total Bilirubin: 0.4 mg/dL (ref 0.3–1.2)

## 2014-01-27 LAB — URINALYSIS, ROUTINE W REFLEX MICROSCOPIC
Bilirubin Urine: NEGATIVE
HGB URINE DIPSTICK: NEGATIVE
KETONES UR: NEGATIVE
LEUKOCYTES UA: NEGATIVE
Nitrite: NEGATIVE
Total Protein, Urine: NEGATIVE
UROBILINOGEN UA: 0.2 (ref 0.0–1.0)
Urine Glucose: NEGATIVE
pH: 5.5 (ref 5.0–8.0)

## 2014-01-27 LAB — TSH: TSH: 1.07 u[IU]/mL (ref 0.35–5.50)

## 2014-01-27 LAB — HCG, QUANTITATIVE, PREGNANCY: HCG, BETA CHAIN, QUANT, S: 0 m[IU]/mL

## 2014-01-28 ENCOUNTER — Encounter: Payer: Self-pay | Admitting: Internal Medicine

## 2014-01-28 DIAGNOSIS — E876 Hypokalemia: Secondary | ICD-10-CM | POA: Insufficient documentation

## 2014-01-28 MED ORDER — POTASSIUM CHLORIDE CRYS ER 20 MEQ PO TBCR: 20.0000 meq | EXTENDED_RELEASE_TABLET | Freq: Two times a day (BID) | ORAL | Status: DC

## 2014-01-28 NOTE — Assessment & Plan Note (Signed)
Her K+ is slightly low, will restart K+ replacement therapy

## 2014-01-28 NOTE — Addendum Note (Signed)
Addended by: Janith Lima on: 01/28/2014 08:52 AM   Modules accepted: Orders

## 2014-01-30 ENCOUNTER — Telehealth: Payer: Self-pay | Admitting: *Deleted

## 2014-01-30 NOTE — Telephone Encounter (Signed)
Patient phoned requesting clarification on potassium dosing and frequency and that her strattera had not been sent to pharmacy.  No Straterra noted on MAR. Phoned patient & clarified ordered KCl from [facility].  Please advise regarding other.  CB# [phone]

## 2014-01-30 NOTE — Telephone Encounter (Signed)
Well, all right then.Thank you. I'll let her know.

## 2014-01-30 NOTE — Telephone Encounter (Signed)
I have no idea what she is talking about with strattera I have no info about her having a diagnosis of ADHD

## 2014-02-02 ENCOUNTER — Ambulatory Visit (INDEPENDENT_AMBULATORY_CARE_PROVIDER_SITE_OTHER): Payer: BC Managed Care – PPO | Admitting: Emergency Medicine

## 2014-02-02 VITALS — BP 162/106 | HR 100 | Temp 98.7°F | Resp 16 | Ht 70.0 in | Wt 208.0 lb

## 2014-02-02 DIAGNOSIS — J209 Acute bronchitis, unspecified: Secondary | ICD-10-CM

## 2014-02-02 MED ORDER — AZITHROMYCIN 250 MG PO TABS: ORAL_TABLET | ORAL | Status: DC

## 2014-02-02 MED ORDER — ALBUTEROL SULFATE HFA 108 (90 BASE) MCG/ACT IN AERS: 2.0000 | INHALATION_SPRAY | RESPIRATORY_TRACT | Status: DC | PRN

## 2014-02-02 MED ORDER — PROMETHAZINE-CODEINE 6.25-10 MG/5ML PO SYRP: 5.0000 mL | ORAL_SOLUTION | Freq: Four times a day (QID) | ORAL | Status: DC | PRN

## 2014-02-02 NOTE — Patient Instructions (Signed)
Metered Dose Inhaler (No Spacer Used) Inhaled medicines are the basis of asthma treatment and other breathing problems. Inhaled medicine can only be effective if used properly. Good technique assures that the medicine reaches the lungs. Metered dose inhalers (MDIs) are used to deliver a variety of inhaled medicines. These include quick relief or rescue medicines (such as bronchodilators) and controller medicines (such as corticosteroids). The medicine is delivered by pushing down on a metal canister to release a set amount of spray. If you are using different kinds of inhalers, use your quick relief medicine to open the airways 10 15 minutes before using a steroid if instructed to do so by your health care provider. If you are unsure which inhalers to use and the order of using them, ask your health care provider, nurse, or respiratory therapist. HOW TO USE THE INHALER 1. Remove cap from inhaler. 2. If you are using the inhaler for the first time, you will need to prime it. Shake the inhaler for 5 seconds and release four puffs into the air, away from your face. Ask your health care provider or pharmacist if you have questions about priming your inhaler. 3. Shake inhaler for 5 seconds before each breath in (inhalation). 4. Position the inhaler so that the top of the canister faces up. 5. Put your index finger on the top of the medicine canister. Your thumb supports the bottom of the inhaler. 6. Open your mouth. 7. Either place the inhaler between your teeth and place your lips tightly around the mouthpiece, or hold the inhaler 1 2 inches away from your open mouth. If you are unsure of which technique to use, ask your health care provider. 8. Breathe out (exhale) normally and as completely as possible. 9. Press the canister down with the index finger to release the medicine. 10. At the same time as the canister is pressed, inhale deeply and slowly until the lungs are completely filled. This should take  4 6 seconds. Keep your tongue down. 11. Hold the medicine in your lungs for up to 5 10 seconds (10 seconds is best). This helps the medicine get into the small airways of your lungs. 12. Breathe out slowly, through pursed lips. Whistling is an example of pursed lips. 13. Wait at least 1 minute between puffs. Continue with the above steps until you have taken the number of puffs your health care provider has ordered. Do not use the inhaler more than your health care provider directs you to. 14. Replace cap on inhaler. 15. Follow the directions from your health care provider or the inhaler insert for cleaning the inhaler. If you are using a steroid inhaler, rinse your mouth with water after your last puff, gargle, and spit out the water. Do not swallow the water. AVOID:  Inhaling before or after starting the spray of medicine. It takes practice to coordinate your breathing with triggering the spray.  Inhaling through the nose (rather than the mouth) when triggering the spray. HOW TO DETERMINE IF YOUR INHALER IS FULL OR NEARLY EMPTY You cannot know when an inhaler is empty by shaking it. A few inhalers are now being made with dose counters. Ask your health care provider for a prescription that has a dose counter if you feel you need that extra help. If your inhaler does not have a counter, ask your health care provider to help you determine the date you need to refill your inhaler. Write the refill date on a calendar or your inhaler canister.  Refill your inhaler 7 10 days before it runs out. Be sure to keep an adequate supply of medicine. This includes making sure it is not expired, and you have a spare inhaler.  SEEK MEDICAL CARE IF:   Symptoms are only partially relieved with your inhaler.  You are having trouble using your inhaler.  You experience some increase in phlegm. SEEK IMMEDIATE MEDICAL CARE IF:   You feel little or no relief with your inhalers. You are still wheezing and are feeling  shortness of breath or tightness in your chest or both.  You have dizziness, headaches, or fast heart rate.  You have chills, fever, or night sweats.  There is a noticeable increase in phlegm production, or there is blood in the phlegm. Document Released: 09/28/2007 Document Revised: 08/03/2013 Document Reviewed: 05/19/2013 [facility] Patient Information 2014 [place], [place].
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
## 2014-02-03 NOTE — Telephone Encounter (Signed)
Patient is requesting an oow note for last night.   [phone]

## 2014-02-10 ENCOUNTER — Telehealth: Payer: Self-pay | Admitting: *Deleted

## 2014-02-10 NOTE — Telephone Encounter (Signed)
Patient phoned again wanting strattera.  Reviewed previous documentation and phoned patient & left voicemail message that PCP is not going to prescribe this medication and that if she needed to, to schedule an OV to d/w PCP.

## 2014-02-20 ENCOUNTER — Ambulatory Visit (INDEPENDENT_AMBULATORY_CARE_PROVIDER_SITE_OTHER): Payer: BC Managed Care – PPO | Admitting: Internal Medicine

## 2014-02-20 ENCOUNTER — Other Ambulatory Visit (INDEPENDENT_AMBULATORY_CARE_PROVIDER_SITE_OTHER): Payer: BC Managed Care – PPO

## 2014-02-20 ENCOUNTER — Encounter: Payer: Self-pay | Admitting: Internal Medicine

## 2014-02-20 VITALS — BP 144/110 | HR 77 | Temp 98.2°F | Resp 15 | Wt 208.4 lb

## 2014-02-20 DIAGNOSIS — D62 Acute posthemorrhagic anemia: Secondary | ICD-10-CM

## 2014-02-20 DIAGNOSIS — N920 Excessive and frequent menstruation with regular cycle: Secondary | ICD-10-CM

## 2014-02-20 DIAGNOSIS — E876 Hypokalemia: Secondary | ICD-10-CM

## 2014-02-20 DIAGNOSIS — I10 Essential (primary) hypertension: Secondary | ICD-10-CM

## 2014-02-20 LAB — CBC WITH DIFFERENTIAL/PLATELET
BASOS PCT: 0.5 % (ref 0.0–3.0)
Basophils Absolute: 0 10*3/uL (ref 0.0–0.1)
EOS ABS: 0.1 10*3/uL (ref 0.0–0.7)
EOS PCT: 1.4 % (ref 0.0–5.0)
HEMATOCRIT: 34.5 % — AB (ref 36.0–46.0)
Hemoglobin: 10.9 g/dL — ABNORMAL LOW (ref 12.0–15.0)
LYMPHS ABS: 2.1 10*3/uL (ref 0.7–4.0)
Lymphocytes Relative: 36.9 % (ref 12.0–46.0)
MCHC: 31.7 g/dL (ref 30.0–36.0)
MCV: 80.9 fl (ref 78.0–100.0)
MONO ABS: 0.4 10*3/uL (ref 0.1–1.0)
Monocytes Relative: 7.4 % (ref 3.0–12.0)
NEUTROS ABS: 3.1 10*3/uL (ref 1.4–7.7)
NEUTROS PCT: 53.8 % (ref 43.0–77.0)
Platelets: 327 10*3/uL (ref 150.0–400.0)
RBC: 4.26 Mil/uL (ref 3.87–5.11)
RDW: 17.4 % — ABNORMAL HIGH (ref 11.5–14.6)
WBC: 5.7 10*3/uL (ref 4.5–10.5)

## 2014-02-20 LAB — BASIC METABOLIC PANEL
BUN: 11 mg/dL (ref 6–23)
CHLORIDE: 104 meq/L (ref 96–112)
CO2: 24 meq/L (ref 19–32)
CREATININE: 0.7 mg/dL (ref 0.4–1.2)
Calcium: 9.1 mg/dL (ref 8.4–10.5)
GFR: 130.65 mL/min (ref >60.00)
Glucose, Bld: 99 mg/dL (ref 70–99)
POTASSIUM: 3.4 meq/L — AB (ref 3.5–5.1)
Sodium: 141 mEq/L (ref 135–145)

## 2014-02-20 LAB — PROTIME-INR
INR: 1.1 ratio — AB (ref 0.8–1.0)
Prothrombin Time: 11.8 s (ref 10.2–12.4)

## 2014-02-20 LAB — APTT: aPTT: 26.1 s (ref 21.7–28.8)

## 2014-02-20 MED ORDER — SPIRONOLACTONE 25 MG PO TABS: 25.0000 mg | ORAL_TABLET | Freq: Every day | ORAL | Status: DC

## 2014-02-20 NOTE — Progress Notes (Signed)
Subjective:    Patient ID: Leslie [name], female    DOB: [birth year], 38 y.o.   MRN: [id number]  HPI  Her menses began 02/13/14. This occurred as spotting for several days until 3/6 when she began to have heavy menses. She stated she was using a pad an hour. Sometimes she will use 2 pads & both are desaturated. She actually saturated her clothing 3/6.  She had a similar episode a few years ago in the setting of stress .  She's had severe cramps with menstruation. She's been using 4-6 ibuprofen a day.  She's not on birth control pills and has never been on hormone replacement.She saw Dr Leo Grosser 02/2013.  There is no family history of heavy menses    Review of Systems  She denies epistaxis, hemoptysis, hematuria, melena, or rectal bleeding.She has no unexplained weight loss, dysphagia, or abdominal pain. She has no abnormal bruising or bleeding otherwise. She has no difficulty stopping bleeding with injury.  She was treated for the flu last week. Apparently she had some bronchospasm and received a cough syrup as well as an inhaler.  In February of this year her hematocrit was 34.3. The RDW was low with a normal MCV. Her TSH was normal at that time. Potassium 3.3.  She previously had taken a diuretic (Microzide 12.5 mg)but this caused frequency and she stopped it. There is no diuretic listed among her current medications.       Objective:   Physical Exam Gen.: Healthy and well-nourished in appearance. Alert, appropriate and cooperative throughout exam. Eyes: No corneal or conjunctival inflammation noted. No icterus. Nose: External nasal exam reveals no deformity or inflammation. Nasal mucosa are pink and moist. No lesions or exudates noted.   Mouth: Oral mucosa and oropharynx reveal no lesions or exudates. Teeth in good repair. Neck: No deformities, masses, or tenderness noted.  Thyroid normal Lungs: Normal respiratory effort; chest expands symmetrically. Lungs are clear to  auscultation without rales, wheezes, or increased work of breathing. Heart: Normal rate and rhythm. Normal S1 and S2. No gallop, click, or rub. Grade 1 murmur . Abdomen: Bowel sounds normal; abdomen soft and nontender. No masses, organomegaly or hernias noted.                                 Musculoskeletal/extremities:  No clubbing, cyanosis, edema, or significant extremity  deformity noted. Range of motion normal .Tone & strength normal. Hand joints normal Fingernail  health good. Able to lie down & sit up w/o help.  Vascular: Carotid, radial artery, dorsalis pedis and  posterior tibial pulses are full and equal. No bruits present. Neurologic: Alert and oriented x3.  Gait normal         Skin: Intact without suspicious lesions or rashes. Lymph: No cervical, axillary lymphadenopathy present. Psych: La belle indifference affect in relation to heavy menses. Normally interactive                                                                                        Assessment & Plan:  #1 dramatic dysfunctional menses  #  2 hypertension, uncontrolled  #3 hypokalemia on Microzide  #4 anemia  See orders

## 2014-02-20 NOTE — Progress Notes (Signed)
Pre visit review using our clinic review tool, if applicable. No additional management support is needed unless otherwise documented below in the visit note. 

## 2014-02-20 NOTE — Patient Instructions (Signed)
The Gynecologic  referral will be scheduled  ASAP and you'll be notified of the time. Please remain out of work until 02/22/14

## 2014-02-21 ENCOUNTER — Telehealth: Payer: Self-pay | Admitting: *Deleted

## 2014-02-21 ENCOUNTER — Ambulatory Visit (INDEPENDENT_AMBULATORY_CARE_PROVIDER_SITE_OTHER): Payer: BC Managed Care – PPO | Admitting: Internal Medicine

## 2014-02-21 VITALS — BP 160/118 | HR 99 | Temp 98.3°F | Resp 18 | Ht 70.0 in | Wt 206.4 lb

## 2014-02-21 DIAGNOSIS — F419 Anxiety disorder, unspecified: Secondary | ICD-10-CM

## 2014-02-21 DIAGNOSIS — R05 Cough: Secondary | ICD-10-CM

## 2014-02-21 DIAGNOSIS — F411 Generalized anxiety disorder: Secondary | ICD-10-CM

## 2014-02-21 DIAGNOSIS — R251 Tremor, unspecified: Secondary | ICD-10-CM

## 2014-02-21 DIAGNOSIS — R059 Cough, unspecified: Secondary | ICD-10-CM

## 2014-02-21 DIAGNOSIS — R259 Unspecified abnormal involuntary movements: Secondary | ICD-10-CM

## 2014-02-21 DIAGNOSIS — G4700 Insomnia, unspecified: Secondary | ICD-10-CM

## 2014-02-21 MED ORDER — TEMAZEPAM 15 MG PO CAPS: 15.0000 mg | ORAL_CAPSULE | Freq: Every evening | ORAL | Status: DC | PRN
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
## 2014-02-21 NOTE — Telephone Encounter (Signed)
Please continue both. We need Dr Caralee Ates opinion as to whether she wants you to stay on spironolactone or take another blood pressure medicine.

## 2014-02-21 NOTE — Telephone Encounter (Signed)
Patient called requesting clarification if PCP wants patient to continue metoprolol in addition to aldactone.  Please advise.  CB# [phone]

## 2014-02-21 NOTE — Patient Instructions (Signed)
Cough, Adult  A cough is a reflex that helps clear your throat and airways. It can help heal the body or may be a reaction to an irritated airway. A cough may only last 2 or 3 weeks (acute) or may last more than 8 weeks (chronic).  CAUSES Acute cough:  Viral or bacterial infections. Chronic cough:  Infections.  Allergies.  Asthma.  Post-nasal drip.  Smoking.  Heartburn or acid reflux.  Some medicines.  Chronic lung problems (COPD).  Cancer. SYMPTOMS   Cough.  Fever.  Chest pain.  Increased breathing rate.  High-pitched whistling sound when breathing (wheezing).  Colored mucus that you cough up (sputum). TREATMENT   A bacterial cough may be treated with antibiotic medicine.  A viral cough must run its course and will not respond to antibiotics.  Your caregiver may recommend other treatments if you have a chronic cough. HOME CARE INSTRUCTIONS   Only take over-the-counter or prescription medicines for pain, discomfort, or fever as directed by your caregiver. Use cough suppressants only as directed by your caregiver.  Use a cold steam vaporizer or humidifier in your bedroom or home to help loosen secretions.  Sleep in a semi-upright position if your cough is worse at night.  Rest as needed.  Stop smoking if you smoke. SEEK IMMEDIATE MEDICAL CARE IF:   You have pus in your sputum.  Your cough starts to worsen.  You cannot control your cough with suppressants and are losing sleep.  You begin coughing up blood.  You have difficulty breathing.  You develop pain which is getting worse or is uncontrolled with medicine.  You have a fever. MAKE SURE YOU:   Understand these instructions.  Will watch your condition.  Will get help right away if you are not doing well or get worse. Document Released: 05/30/2011 Document Revised: 02/23/2012 Document Reviewed: 05/30/2011 [facility] Patient Information 2014 [place]. Insomnia Insomnia is frequent  trouble falling and/or staying asleep. Insomnia can be a long term problem or a short term problem. Both are common. Insomnia can be a short term problem when the wakefulness is related to a certain stress or worry. Long term insomnia is often related to ongoing stress during waking hours and/or poor sleeping habits. Overtime, sleep deprivation itself can make the problem worse. Every little thing feels more severe because you are overtired and your ability to cope is decreased. CAUSES   Stress, anxiety, and depression.  Poor sleeping habits.  Distractions such as TV in the bedroom.  Naps close to bedtime.  Engaging in emotionally charged conversations before bed.  Technical reading before sleep.  Alcohol and other sedatives. They may make the problem worse. They can hurt normal sleep patterns and normal dream activity.  Stimulants such as caffeine for several hours prior to bedtime.  Pain syndromes and shortness of breath can cause insomnia.  Exercise late at night.  Changing time zones may cause sleeping problems (jet lag). It is sometimes helpful to have someone observe your sleeping patterns. They should look for periods of not breathing during the night (sleep apnea). They should also look to see how long those periods last. If you live alone or observers are uncertain, you can also be observed at a sleep clinic where your sleep patterns will be professionally monitored. Sleep apnea requires a checkup and treatment. Give your caregivers your medical history. Give your caregivers observations your family has made about your sleep.  SYMPTOMS   Not feeling rested in the morning.  Anxiety  and restlessness at bedtime.  Difficulty falling and staying asleep. TREATMENT   Your caregiver may prescribe treatment for an underlying medical disorders. Your caregiver can give advice or help if you are using alcohol or other drugs for self-medication. Treatment of underlying problems will  usually eliminate insomnia problems.  Medications can be prescribed for short time use. They are generally not recommended for lengthy use.  Over-the-counter sleep medicines are not recommended for lengthy use. They can be habit forming.  You can promote easier sleeping by making lifestyle changes such as:  Using relaxation techniques that help with breathing and reduce muscle tension.  Exercising earlier in the day.  Changing your diet and the time of your last meal. No night time snacks.  Establish a regular time to go to bed.  Counseling can help with stressful problems and worry.  Soothing music and white noise may be helpful if there are background noises you cannot remove.  Stop tedious detailed work at least one hour before bedtime. HOME CARE INSTRUCTIONS   Keep a diary. Inform your caregiver about your progress. This includes any medication side effects. See your caregiver regularly. Take note of:  Times when you are asleep.  Times when you are awake during the night.  The quality of your sleep.  How you feel the next day. This information will help your caregiver care for you.  Get out of bed if you are still awake after 15 minutes. Read or do some quiet activity. Keep the lights down. Wait until you feel sleepy and go back to bed.  Keep regular sleeping and waking hours. Avoid naps.  Exercise regularly.  Avoid distractions at bedtime. Distractions include watching television or engaging in any intense or detailed activity like attempting to balance the household checkbook.  Develop a bedtime ritual. Keep a familiar routine of bathing, brushing your teeth, climbing into bed at the same time each night, listening to soothing music. Routines increase the success of falling to sleep faster.  Use relaxation techniques. This can be using breathing and muscle tension release routines. It can also include visualizing peaceful scenes. You can also help control troubling  or intruding thoughts by keeping your mind occupied with boring or repetitive thoughts like the old concept of counting sheep. You can make it more creative like imagining planting one beautiful flower after another in your backyard garden.  During your day, work to eliminate stress. When this is not possible use some of the previous suggestions to help reduce the anxiety that accompanies stressful situations. MAKE SURE YOU:   Understand these instructions.  Will watch your condition.  Will get help right away if you are not doing well or get worse. Document Released: 11/28/2000 Document Revised: 02/23/2012 Document Reviewed: 12/29/2007 [facility] Patient Information 2014 [place], [place]. Stress Stress-related medical problems are becoming increasingly common. The body has a built-in physical response to stressful situations. Faced with pressure, challenge or danger, we need to react quickly. Our bodies release hormones such as cortisol and adrenaline to help do this. These hormones are part of the "fight or flight" response and affect the metabolic rate, heart rate and blood pressure, resulting in a heightened, stressed state that prepares the body for optimum performance in dealing with a stressful situation. It is likely that early man required these mechanisms to stay alive, but usually modern stresses do not call for this, and the same hormones released in today's world can damage health and reduce coping ability. CAUSES  Pressure to  perform at work, at school or in sports.  Threats of physical violence.  Money worries.  Arguments.  Family conflicts.  Divorce or separation from significant other.  Bereavement.  New job or unemployment.  Changes in location.  Alcohol or drug abuse. SOMETIMES, THERE IS NO PARTICULAR REASON FOR DEVELOPING STRESS. Almost all people are at risk of being stressed at some time in their lives. It is important to know that some stress is temporary and  some is long term.  Temporary stress will go away when a situation is resolved. Most people can cope with short periods of stress, and it can often be relieved by relaxing, taking a walk, chatting through issues with friends, or having a good night's sleep.  Chronic (long-term, continuous) stress is much harder to deal with. It can be psychologically and emotionally damaging. It can be harmful both for an individual and for friends and family. SYMPTOMS Everyone reacts to stress differently. There are some common effects that help [place] recognize it. In times of extreme stress, people may:  Shake uncontrollably.  Breathe faster and deeper than normal (hyperventilate).  Vomit.  For people with asthma, stress can trigger an attack.  For some people, stress may trigger migraine headaches, ulcers, and body pain. PHYSICAL EFFECTS OF STRESS MAY INCLUDE:  Loss of energy.  Skin problems.  Aches and pains resulting from tense muscles, including neck ache, backache and tension headaches.  Increased pain from arthritis and other conditions.  Irregular heart beat (palpitations).  Periods of irritability or anger.  Apathy or depression.  Anxiety (feeling uptight or worrying).  Unusual behavior.  Loss of appetite.  Comfort eating.  Lack of concentration.  Loss of, or decreased, sex-drive.  Increased smoking, drinking, or recreational drug use.  For women, missed periods.  Ulcers, joint pain, and muscle pain. Post-traumatic stress is the stress caused by any serious accident, strong emotional damage, or extremely difficult or violent experience such as rape or war. Post-traumatic stress victims can experience mixtures of emotions such as fear, shame, depression, guilt or anger. It may include recurrent memories or images that may be haunting. These feelings can last for weeks, months or even years after the traumatic event that triggered them. Specialized treatment, possibly with  medicines and psychological therapies, is available. If stress is causing physical symptoms, severe distress or making it difficult for you to function as normal, it is worth seeing your caregiver. It is important to remember that although stress is a usual part of life, extreme or prolonged stress can lead to other illnesses that will need treatment. It is better to visit a doctor sooner rather than later. Stress has been linked to the development of high blood pressure and heart disease, as well as insomnia and depression. There is no diagnostic test for stress since everyone reacts to it differently. But a caregiver will be able to spot the physical symptoms, such as:  Headaches.  Shingles.  Ulcers. Emotional distress such as intense worry, low mood or irritability should be detected when the doctor asks pertinent questions to identify any underlying problems that might be the cause. In case there are physical reasons for the symptoms, the doctor may also want to do some tests to exclude certain conditions. If you feel that you are suffering from stress, try to identify the aspects of your life that are causing it. Sometimes you may not be able to change or avoid them, but even a small change can have a positive ripple effect.  A simple lifestyle change can make all the difference. STRATEGIES THAT CAN HELP DEAL WITH STRESS:  Delegating or sharing responsibilities.  Avoiding confrontations.  Learning to be more assertive.  Regular exercise.  Avoid using alcohol or street drugs to cope.  Eating a healthy, balanced diet, rich in fruit and vegetables and proteins.  Finding humor or absurdity in stressful situations.  Never taking on more than you know you can handle comfortably.  Organizing your time better to get as much done as possible.  Talking to friends or family and sharing your thoughts and fears.  Listening to music or relaxation tapes.  Tensing and then relaxing your  muscles, starting at the toes and working up to the head and neck. If you think that you would benefit from help, either in identifying the things that are causing your stress or in learning techniques to help you relax, see a caregiver who is capable of helping you with this. Rather than relying on medications, it is usually better to try and identify the things in your life that are causing stress and try to deal with them. There are many techniques of managing stress including counseling, psychotherapy, aromatherapy, yoga, and exercise. Your caregiver can help you determine what is best for you. Document Released: 02/21/2003 Document Revised: 02/23/2012 Document Reviewed: 01/18/2008 [facility] Patient Information 2014 [place], [place].

## 2014-02-22 NOTE — Telephone Encounter (Signed)
Phoned patient and relayed PCP response and recommendation on voicemail message.

## 2014-02-23 ENCOUNTER — Telehealth: Payer: Self-pay | Admitting: *Deleted

## 2014-02-23 ENCOUNTER — Other Ambulatory Visit: Payer: Self-pay | Admitting: Internal Medicine

## 2014-02-23 DIAGNOSIS — F411 Generalized anxiety disorder: Secondary | ICD-10-CM

## 2014-02-23 MED ORDER — SERTRALINE HCL 50 MG PO TABS: ORAL_TABLET | ORAL | Status: DC

## 2014-02-23 NOTE — Telephone Encounter (Signed)
Pt called wanting to know if we could call in something for anxiety for her.  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  She mentioned Zoloft but she don't known if that would help.  She just needs something.  Please advise.//AB/CMA

## 2014-02-24 NOTE — Telephone Encounter (Signed)
done

## 2014-02-24 NOTE — Telephone Encounter (Signed)
LMOM (2:08pm) informing the pt that Dr. Park Meo the Zoloft and a rx was sent to her pharmacy.  Informed the pt if she has any questions she can give [place] a call back.//AB/CMA

## 2014-03-28 ENCOUNTER — Other Ambulatory Visit: Payer: Self-pay | Admitting: Obstetrics and Gynecology

## 2014-03-30 ENCOUNTER — Telehealth: Payer: Self-pay | Admitting: *Deleted

## 2014-03-30 ENCOUNTER — Encounter (HOSPITAL_COMMUNITY): Payer: Self-pay | Admitting: Pharmacist

## 2014-03-30 NOTE — Telephone Encounter (Signed)
   This is not my patient; I saw her acutely. Such a note should come from the surgeon. Please verify who is her primary care physician . Thank you

## 2014-03-30 NOTE — Telephone Encounter (Signed)
Pt is having surgery on 4.23.15 & is requesting a note to giver to her work to put her out 4.23.15 through 4.30.15.

## 2014-03-30 NOTE — Telephone Encounter (Signed)
Left msg on vmail

## 2014-04-05 ENCOUNTER — Other Ambulatory Visit: Payer: Self-pay | Admitting: Obstetrics and Gynecology

## 2014-04-06 ENCOUNTER — Encounter (HOSPITAL_COMMUNITY): Payer: Self-pay | Admitting: Certified Registered"

## 2014-04-06 ENCOUNTER — Ambulatory Visit (HOSPITAL_COMMUNITY)
Admission: RE | Admit: 2014-04-06 | Discharge: 2014-04-06 | Disposition: A | Discharge status: Home / Self Care | Payer: BC Managed Care – PPO | Source: Ambulatory Visit | Attending: Obstetrics and Gynecology | Admitting: Obstetrics and Gynecology

## 2014-04-06 ENCOUNTER — Encounter (HOSPITAL_COMMUNITY)
Admission: RE | Disposition: A | Discharge status: Home / Self Care | Payer: Self-pay | Source: Ambulatory Visit | Attending: Obstetrics and Gynecology

## 2014-04-06 ENCOUNTER — Encounter (HOSPITAL_COMMUNITY): Payer: BC Managed Care – PPO | Admitting: Registered Nurse

## 2014-04-06 ENCOUNTER — Ambulatory Visit (HOSPITAL_COMMUNITY): Payer: BC Managed Care – PPO | Admitting: Registered Nurse

## 2014-04-06 DIAGNOSIS — I519 Heart disease, unspecified: Secondary | ICD-10-CM | POA: Insufficient documentation

## 2014-04-06 DIAGNOSIS — J40 Bronchitis, not specified as acute or chronic: Secondary | ICD-10-CM

## 2014-04-06 DIAGNOSIS — D509 Iron deficiency anemia, unspecified: Secondary | ICD-10-CM | POA: Insufficient documentation

## 2014-04-06 DIAGNOSIS — N920 Excessive and frequent menstruation with regular cycle: Secondary | ICD-10-CM | POA: Diagnosis present

## 2014-04-06 DIAGNOSIS — N9489 Other specified conditions associated with female genital organs and menstrual cycle: Secondary | ICD-10-CM | POA: Diagnosis present

## 2014-04-06 DIAGNOSIS — D649 Anemia, unspecified: Secondary | ICD-10-CM | POA: Insufficient documentation

## 2014-04-06 DIAGNOSIS — D250 Submucous leiomyoma of uterus: Secondary | ICD-10-CM | POA: Insufficient documentation

## 2014-04-06 DIAGNOSIS — I10 Essential (primary) hypertension: Secondary | ICD-10-CM | POA: Insufficient documentation

## 2014-04-06 DIAGNOSIS — N979 Female infertility, unspecified: Secondary | ICD-10-CM | POA: Insufficient documentation

## 2014-04-06 DIAGNOSIS — I779 Disorder of arteries and arterioles, unspecified: Secondary | ICD-10-CM | POA: Insufficient documentation

## 2014-04-06 DIAGNOSIS — D573 Sickle-cell trait: Secondary | ICD-10-CM | POA: Insufficient documentation

## 2014-04-06 HISTORY — PX: DILATATION & CURRETTAGE/HYSTEROSCOPY WITH RESECTOCOPE: SHX5572

## 2014-04-06 LAB — BASIC METABOLIC PANEL
BUN: 17 mg/dL (ref 6–23)
CO2: 25 mEq/L (ref 19–32)
Calcium: 9.4 mg/dL (ref 8.4–10.5)
Chloride: 103 mEq/L (ref 96–112)
Creatinine, Ser: 0.72 mg/dL (ref 0.50–1.10)
GFR calc non Af Amer: >90 mL/min (ref >90)
Glucose, Bld: 97 mg/dL (ref 70–99)
POTASSIUM: 4.5 meq/L (ref 3.7–5.3)
SODIUM: 138 meq/L (ref 137–147)

## 2014-04-06 LAB — CBC
HCT: 36.5 % (ref 36.0–46.0)
Hemoglobin: 11.5 g/dL — ABNORMAL LOW (ref 12.0–15.0)
MCH: 25.8 pg — ABNORMAL LOW (ref 26.0–34.0)
MCHC: 31.5 g/dL (ref 30.0–36.0)
MCV: 82 fL (ref 78.0–100.0)
Platelets: 303 10*3/uL (ref 150–400)
RBC: 4.45 MIL/uL (ref 3.87–5.11)
RDW: 17.2 % — AB (ref 11.5–15.5)
WBC: 7 10*3/uL (ref 4.0–10.5)

## 2014-04-06 LAB — HCG, SERUM, QUALITATIVE: Preg, Serum: NEGATIVE

## 2014-04-06 SURGERY — DILATATION & CURETTAGE/HYSTEROSCOPY WITH RESECTOCOPE
Anesthesia: Spinal | Site: Uterus

## 2014-04-06 MED ORDER — EPHEDRINE SULFATE 50 MG/ML IJ SOLN
INTRAMUSCULAR | Status: DC | PRN
  Administered 2014-04-06 (×2): 10 mg via INTRAVENOUS

## 2014-04-06 MED ORDER — CEFAZOLIN SODIUM-DEXTROSE 2-3 GM-% IV SOLR
INTRAVENOUS | Status: AC
  Filled 2014-04-06: qty 50

## 2014-04-06 MED ORDER — ONDANSETRON HCL 4 MG/2ML IJ SOLN: 4.0000 mg | Freq: Once | INTRAMUSCULAR | Status: DC | PRN

## 2014-04-06 MED ORDER — PROPOFOL 10 MG/ML IV EMUL
INTRAVENOUS | Status: AC
  Filled 2014-04-06: qty 20

## 2014-04-06 MED ORDER — PROPOFOL 10 MG/ML IV BOLUS
INTRAVENOUS | Status: DC | PRN
  Administered 2014-04-06 (×5): 50 mg via INTRAVENOUS

## 2014-04-06 MED ORDER — PHENYLEPHRINE HCL 10 MG/ML IJ SOLN
INTRAMUSCULAR | Status: DC | PRN
  Administered 2014-04-06 (×9): 80 ug via INTRAVENOUS

## 2014-04-06 MED ORDER — KETOROLAC TROMETHAMINE 30 MG/ML IJ SOLN
INTRAMUSCULAR | Status: AC
  Filled 2014-04-06: qty 1

## 2014-04-06 MED ORDER — FENTANYL CITRATE 0.05 MG/ML IJ SOLN
INTRAMUSCULAR | Status: DC | PRN
  Administered 2014-04-06 (×7): 50 ug via INTRAVENOUS

## 2014-04-06 MED ORDER — MIDAZOLAM HCL 2 MG/2ML IJ SOLN
INTRAMUSCULAR | Status: DC | PRN
  Administered 2014-04-06: 2 mg via INTRAVENOUS

## 2014-04-06 MED ORDER — GLYCINE 1.5 % IR SOLN
Status: DC | PRN
  Administered 2014-04-06: 3000 mL

## 2014-04-06 MED ORDER — HYDROCODONE-ACETAMINOPHEN 5-325 MG PO TABS: 1.0000 | ORAL_TABLET | Freq: Four times a day (QID) | ORAL | Status: DC | PRN

## 2014-04-06 MED ORDER — PHENYLEPHRINE 40 MCG/ML (10ML) SYRINGE FOR IV PUSH (FOR BLOOD PRESSURE SUPPORT)
PREFILLED_SYRINGE | INTRAVENOUS | Status: AC
  Filled 2014-04-06: qty 5

## 2014-04-06 MED ORDER — DEXAMETHASONE SODIUM PHOSPHATE 10 MG/ML IJ SOLN
INTRAMUSCULAR | Status: DC | PRN
  Administered 2014-04-06: 10 mg via INTRAVENOUS

## 2014-04-06 MED ORDER — CEFAZOLIN SODIUM-DEXTROSE 2-3 GM-% IV SOLR
INTRAVENOUS | Status: DC | PRN
  Administered 2014-04-06: 2 g via INTRAVENOUS

## 2014-04-06 MED ORDER — IBUPROFEN 600 MG PO TABS: ORAL_TABLET | ORAL | Status: DC

## 2014-04-06 MED ORDER — LIDOCAINE HCL 2 % IJ SOLN
INTRAMUSCULAR | Status: AC
  Filled 2014-04-06: qty 20

## 2014-04-06 MED ORDER — FENTANYL CITRATE 0.05 MG/ML IJ SOLN
25.0000 ug | INTRAMUSCULAR | Status: DC | PRN
Start: 2014-04-06 — End: 2014-04-07

## 2014-04-06 MED ORDER — FENTANYL CITRATE 0.05 MG/ML IJ SOLN
INTRAMUSCULAR | Status: AC
  Filled 2014-04-06: qty 2

## 2014-04-06 MED ORDER — LACTATED RINGERS IV SOLN
INTRAVENOUS | Status: DC
  Administered 2014-04-06 (×2): via INTRAVENOUS

## 2014-04-06 MED ORDER — BUPIVACAINE IN DEXTROSE 0.75-8.25 % IT SOLN
INTRATHECAL | Status: DC | PRN
  Administered 2014-04-06: 1 mL via INTRATHECAL
  Administered 2014-04-06: 1.2 mL via INTRATHECAL

## 2014-04-06 MED ORDER — LIDOCAINE HCL (CARDIAC) 20 MG/ML IV SOLN
INTRAVENOUS | Status: AC
  Filled 2014-04-06: qty 5

## 2014-04-06 MED ORDER — LIDOCAINE HCL 2 % IJ SOLN
INTRAMUSCULAR | Status: DC | PRN
  Administered 2014-04-06: 10 mL

## 2014-04-06 MED ORDER — DEXAMETHASONE SODIUM PHOSPHATE 10 MG/ML IJ SOLN
INTRAMUSCULAR | Status: AC
  Filled 2014-04-06: qty 1

## 2014-04-06 MED ORDER — EPHEDRINE 5 MG/ML INJ
INTRAVENOUS | Status: AC
  Filled 2014-04-06: qty 10

## 2014-04-06 MED ORDER — KETOROLAC TROMETHAMINE 30 MG/ML IJ SOLN
INTRAMUSCULAR | Status: DC | PRN
  Administered 2014-04-06 (×2): 30 mg via INTRAMUSCULAR

## 2014-04-06 MED ORDER — ONDANSETRON HCL 4 MG/2ML IJ SOLN
INTRAMUSCULAR | Status: DC | PRN
  Administered 2014-04-06: 4 mg via INTRAVENOUS

## 2014-04-06 MED ORDER — ONDANSETRON HCL 4 MG/2ML IJ SOLN
INTRAMUSCULAR | Status: AC
  Filled 2014-04-06: qty 2

## 2014-04-06 MED ORDER — PHENYLEPHRINE 40 MCG/ML (10ML) SYRINGE FOR IV PUSH (FOR BLOOD PRESSURE SUPPORT)
PREFILLED_SYRINGE | INTRAVENOUS | Status: AC
  Filled 2014-04-06: qty 10

## 2014-04-06 MED ORDER — FENTANYL CITRATE 0.05 MG/ML IJ SOLN
INTRAMUSCULAR | Status: AC
  Filled 2014-04-06: qty 5

## 2014-04-06 MED ORDER — MEPERIDINE HCL 25 MG/ML IJ SOLN: 6.2500 mg | INTRAMUSCULAR | Status: DC | PRN

## 2014-04-06 MED ORDER — KETOROLAC TROMETHAMINE 30 MG/ML IJ SOLN: 15.0000 mg | Freq: Once | INTRAMUSCULAR | Status: DC | PRN

## 2014-04-06 MED ORDER — MIDAZOLAM HCL 2 MG/2ML IJ SOLN
INTRAMUSCULAR | Status: AC
  Filled 2014-04-06: qty 2

## 2014-04-06 SURGICAL SUPPLY — 23 items
CANISTER SUCT 3000ML (MISCELLANEOUS) ×2 IMPLANT
CATH ROBINSON RED A/P 16FR (CATHETERS) ×2 IMPLANT
CLOTH BEACON ORANGE TIMEOUT ST (SAFETY) ×2 IMPLANT
CONTAINER PREFILL 10% NBF 60ML (FORM) ×4 IMPLANT
CORD ACTIVE DISPOSABLE (ELECTRODE) ×1
CORD ELECTRO ACTIVE DISP (ELECTRODE) ×1 IMPLANT
DRAPE HYSTEROSCOPY (DRAPE) ×2 IMPLANT
DRSG TELFA 3X8 NADH (GAUZE/BANDAGES/DRESSINGS) ×2 IMPLANT
ELECT REM PT RETURN 9FT ADLT (ELECTROSURGICAL) ×2
ELECTRODE REM PT RTRN 9FT ADLT (ELECTROSURGICAL) ×1 IMPLANT
GLOVE SURG SS PI 6.5 STRL IVOR (GLOVE) ×4 IMPLANT
GOWN STRL REUS W/TWL LRG LVL3 (GOWN DISPOSABLE) ×4 IMPLANT
LOOP ANGLED CUTTING 22FR (CUTTING LOOP) ×1 IMPLANT
NDL SPNL 22GX3.5 QUINCKE BK (NEEDLE) ×1 IMPLANT
NEEDLE SPNL 22GX3.5 QUINCKE BK (NEEDLE) ×2 IMPLANT
PACK VAGINAL MINOR WOMEN LF (CUSTOM PROCEDURE TRAY) ×2 IMPLANT
PAD DRESSING TELFA 3X8 NADH (GAUZE/BANDAGES/DRESSINGS) ×1 IMPLANT
PAD OB MATERNITY 4.3X12.25 (PERSONAL CARE ITEMS) ×2 IMPLANT
SET TUBING HYSTEROSCOPY 2 NDL (TUBING) ×1 IMPLANT
SYR CONTROL 10ML LL (SYRINGE) ×2 IMPLANT
TOWEL OR 17X24 6PK STRL BLUE (TOWEL DISPOSABLE) ×4 IMPLANT
TUBE HYSTEROSCOPY W Y-CONNECT (TUBING) ×1 IMPLANT
WATER STERILE IRR 1000ML POUR (IV SOLUTION) ×2 IMPLANT
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
## 2014-04-06 NOTE — Anesthesia Postprocedure Evaluation (Signed)
Anesthesia Post Note  Patient: Leslie [name]  Procedure(s) Performed: Procedure(s) (LRB): DILATATION & CURETTAGE/HYSTEROSCOPY WITH RESECTOCOPE,RESECTION OF ENDEMETRIAL MASS (N/A)  Anesthesia type: Spinal  Patient location: PACU  Post pain: Pain level controlled  Post assessment: Post-op Vital signs reviewed  Last Vitals:  Filed Vitals:   04/06/14 1715  BP: 148/93  Pulse: 65  Temp:   Resp: 17    Post vital signs: Reviewed  Level of consciousness: awake  Complications: No apparent anesthesia complications

## 2014-04-06 NOTE — Op Note (Signed)
04/06/2014  4:32 PM  PATIENT:  Leslie [name]  38 y.o. female  PRE-OPERATIVE DIAGNOSIS:  Endometrial Mass, menorrhagia  POST-OPERATIVE DIAGNOSIS:  Endometrial Mass, menorrhagia, probable fibroid  PROCEDURE:  Procedure(s): DILATATION & CURETTAGE/HYSTEROSCOPY WITH RESECTOCOPE,RESECTION OF ENDEMETRIAL MASS  SURGEON:  Eldred Manges, MD  ASSISTANTS: none  ANESTHESIA:   spinal  ESTIMATED BLOOD LOSS: less than 100 cc  BLOOD ADMINISTERED:none  COMPLICATIONS:none  FINDINGS: the uterus sounded to 9 cm. At hysteroscopy there was a 2 cm smooth intracavitary mass, consistent with a submucosal fibroid. No other intrauterine masses were noted.  FLUID DEFICIT:880 cc  LOCAL MEDICATIONS USED:  LIDOCAINE  and Amount: 10 ml  SPECIMEN:  Source of Specimen:  endometrial resection, and endometrial curettings  DISPOSITION OF SPECIMEN:  PATHOLOGY  COUNTS:  YES  DESCRIPTION OF PROCEDURE:the patient was taken to the operating room after appropriate identification and placed on the operating table. After the attainment of adequate general anesthesia she was placed in the lithotomy position. The perineum and vagina were prepped with multiple layers of Betadine. The bladder was emptied with a an in and out catheter. The perineum was draped in sterile field. A gray speculum was placed in the vagina. The cervix was grasped with a single-tooth tenaculum. A paracervical block was achieved with a total of 10 cc of 2% Xylocaine and the 5 and 7:00 positions. The uterus was sounded.  The cervix was then dilated to accommodate the diagnostic hysteroscope. The hysteroscope was used to evaluate all quadrants of the uterus.the above-noted findings were made and the operative hysteroscope was then used after additional dilation to access the intracavitary lesion.  The resectoscope was used to resect the endometrial mass and remove as many pieces as possible from the endometrial cavity. The procedure was discontinued  at 8 fluid deficit at of 880 cc. It appeared that there was minimal fibroid tissue attached to the endometrial cavity. At that time.  All instruments were then removed from the vagina and the patient was awakened from general anesthesia and taken to the recovery room in satisfactory condition having tolerated the procedure well sponge and instrument counts correct.  The patient was treated intraoperatively with Ancef 2 g intravenously and Toradol 30 mg IV and 30 mg IM  PLAN OF CARE: discharge home after postanesthesia care  PATIENT DISPOSITION:  PACU - hemodynamically stable.   Delay start of Pharmacological VTE agent (>24hrs) due to surgical blood loss or risk of bleeding:  not applicable.  SCDs were used during the procedure   Eldred Manges, MD 4:32 PM

## 2014-04-06 NOTE — Anesthesia Procedure Notes (Signed)
Spinal  Patient location during procedure: OR Start time: 04/06/2014 2:30 PM End time: 04/06/2014 2:59 PM Staffing Anesthesiologist: Lyn Hollingshead Performed by: anesthesiologist  Preanesthetic Checklist Completed: patient identified, surgical consent, pre-op evaluation, timeout performed, IV checked, risks and benefits discussed and monitors and equipment checked Spinal Block Patient position: sitting Prep: DuraPrep Patient monitoring: heart rate, cardiac monitor, continuous pulse ox and blood pressure Approach: midline Location: L4-5 Injection technique: single-shot Needle Needle type: Pencan  Needle gauge: 24 G Needle length: 9 cm Needle insertion depth: 6 cm Assessment Sensory level: T4 Additional Notes There were three attempts with + CSF on first attempt at L4-L5 but pt did not state until well into the 3rd attempt that the block was successful so on third attempt at L2-L3 there was again + CSF and the block was adequate for surgery.

## 2014-04-06 NOTE — Anesthesia Preprocedure Evaluation (Signed)
Anesthesia Evaluation  Patient identified by MRN, date of birth, ID band Patient awake    Reviewed: Allergy & Precautions, H&P , NPO status , Patient's Chart, lab work & pertinent test results  Airway Mallampati: I TM Distance: >3 FB Neck ROM: full    Dental no notable dental hx.    Pulmonary neg pulmonary ROS,    Pulmonary exam normal       Cardiovascular hypertension, Pt. on medications     Neuro/Psych negative psych ROS   GI/Hepatic negative GI ROS, Neg liver ROS,   Endo/Other  negative endocrine ROS  Renal/GU negative Renal ROS     Musculoskeletal   Abdominal Normal abdominal exam  (+)   Peds  Hematology   Anesthesia Other Findings   Reproductive/Obstetrics                           Anesthesia Physical Anesthesia Plan  ASA: III  Anesthesia Plan: Spinal   Post-op Pain Management:    Induction:   Airway Management Planned:   Additional Equipment:   Intra-op Plan:   Post-operative Plan:   Informed Consent: I have reviewed the patients History and Physical, chart, labs and discussed the procedure including the risks, benefits and alternatives for the proposed anesthesia with the patient or authorized representative who has indicated his/her understanding and acceptance.     Plan Discussed with: CRNA and Surgeon  Anesthesia Plan Comments:         Anesthesia Quick Evaluation

## 2014-04-06 NOTE — Transfer of Care (Signed)
Immediate Anesthesia Transfer of Care Note  Patient: Leslie [name]  Procedure(s) Performed: Procedure(s): DILATATION & CURETTAGE/HYSTEROSCOPY WITH RESECTOCOPE,RESECTION OF ENDEMETRIAL MASS (N/A)  Patient Location: PACU  Anesthesia Type:Spinal  Level of Consciousness: awake, alert  and oriented  Airway & Oxygen Therapy: Patient Spontanous Breathing and Patient connected to nasal cannula oxygen  Post-op Assessment: Report given to PACU RN and Post -op Vital signs reviewed and stable  Post vital signs: Reviewed and stable  Complications: No apparent anesthesia complications

## 2014-04-06 NOTE — Discharge Instructions (Signed)
Hysteroscopy, Care After °Refer to this sheet in the next few weeks. These instructions provide you with information on caring for yourself after your procedure. Your health care provider may also give you more specific instructions. Your treatment has been planned according to current medical practices, but problems sometimes occur. Call your health care provider if you have any problems or questions after your procedure.  °WHAT TO EXPECT AFTER THE PROCEDURE °After your procedure, it is typical to have the following: °· You may have some cramping. This normally lasts for a couple days. °· You may have bleeding. This can vary from light spotting for a few days to menstrual-like bleeding for 3 7 days. °HOME CARE INSTRUCTIONS °· Rest for the first 1 2 days after the procedure. °· Only take over-the-counter or prescription medicines as directed by your health care provider. Do not take aspirin. It can increase the chances of bleeding. °· Take showers instead of baths for 2 weeks or as directed by your health care provider. °· Do not drive for 24 hours or as directed. °· Do not drink alcohol while taking pain medicine. °· Do not use tampons, douche, or have sexual intercourse for 2 weeks or until your health care provider says it is okay. °· Take your temperature twice a day for 4 5 days. Write it down each time. °· Follow your health care provider's advice about diet, exercise, and lifting. °· If you develop constipation, you may: °· Take a mild laxative if your health care provider approves. °· Add bran foods to your diet. °· Drink enough fluids to keep your urine clear or pale yellow. °· Try to have someone with you or available to you for the first 24 48 hours, especially if you were given a general anesthetic. °· Follow up with your health care provider as directed. °SEEK MEDICAL CARE IF: °· You feel dizzy or lightheaded. °· You feel sick to your stomach (nauseous). °· You have abnormal vaginal discharge. °· You  have a rash. °· You have pain that is not controlled with medicine. °SEEK IMMEDIATE MEDICAL CARE IF: °· You have bleeding that is heavier than a normal menstrual period. °· You have a fever. °· You have increasing cramps or pain, not controlled with medicine. °· You have new belly (abdominal) pain. °· You pass out. °· You have pain in the tops of your shoulders (shoulder strap areas). °· You have shortness of breath. °Document Released: 09/21/2013 Document Reviewed: 06/30/2013 °[employer]® Patient Information ©2014 [employer]. ° °Post Anesthesia Home Care Instructions ° °Activity: °Get plenty of rest for the remainder of the day. A responsible adult should stay with you for 24 hours following the procedure.  °For the next 24 hours, DO NOT: °-Drive a car °-Operate machinery °-Drink alcoholic beverages °-Take any medication unless instructed by your physician °-Make any legal decisions or sign important papers. ° °Meals: °Start with liquid foods such as gelatin or soup. Progress to regular foods as tolerated. Avoid greasy, spicy, heavy foods. If nausea and/or vomiting occur, drink only clear liquids until the nausea and/or vomiting subsides. Call your physician if vomiting continues. ° °Special Instructions/Symptoms: °Your throat may feel dry or sore from the anesthesia or the breathing tube placed in your throat during surgery. If this causes discomfort, gargle with warm salt water. The discomfort should disappear within 24 hours. ° °

## 2014-04-07 LAB — HM PAP SMEAR: HM PAP: NORMAL

## 2014-04-10 ENCOUNTER — Encounter (HOSPITAL_COMMUNITY): Payer: Self-pay | Admitting: Obstetrics and Gynecology

## 2014-04-10 ENCOUNTER — Telehealth: Payer: Self-pay | Admitting: *Deleted

## 2014-04-10 NOTE — Telephone Encounter (Signed)
I dont think she is on this anymore

## 2014-04-10 NOTE — Telephone Encounter (Signed)
Pt called requesting Diovan 160mg  refill.  Medication not on pts current med list.  Please advise

## 2014-04-10 NOTE — Telephone Encounter (Signed)
   When I saw her as acute visit; it was my understanding  that she was seeing Dr. Ronnald Ramp. In reviewing the chart it appears that she has been seen @ [facility] as well. Please contact her and see if Dr. Ronnald Ramp is her primary care physician or if it is Dr. Ruben Reason, @ urgent care. Thank you

## 2014-04-11 NOTE — Telephone Encounter (Signed)
Spoke with pt advised of MDs message.  Pt has appointment 4.29.15

## 2014-04-11 NOTE — Telephone Encounter (Signed)
Spoke with pt she states the Metoprolol is not effective, however the Diovan is effective.  Please advise

## 2014-04-11 NOTE — Telephone Encounter (Signed)
She can't take this if she is at risk of becoming pregnant

## 2014-04-12 ENCOUNTER — Ambulatory Visit (INDEPENDENT_AMBULATORY_CARE_PROVIDER_SITE_OTHER): Payer: BC Managed Care – PPO | Admitting: Internal Medicine

## 2014-04-12 ENCOUNTER — Encounter: Payer: Self-pay | Admitting: Internal Medicine

## 2014-04-12 VITALS — BP 142/100 | HR 104 | Temp 99.7°F | Ht 70.0 in | Wt 212.5 lb

## 2014-04-12 DIAGNOSIS — R7309 Other abnormal glucose: Secondary | ICD-10-CM

## 2014-04-12 DIAGNOSIS — Z0000 Encounter for general adult medical examination without abnormal findings: Secondary | ICD-10-CM | POA: Insufficient documentation

## 2014-04-12 DIAGNOSIS — I10 Essential (primary) hypertension: Secondary | ICD-10-CM

## 2014-04-12 DIAGNOSIS — E876 Hypokalemia: Secondary | ICD-10-CM

## 2014-04-12 MED ORDER — AMLODIPINE BESYLATE 10 MG PO TABS: 10.0000 mg | ORAL_TABLET | Freq: Every day | ORAL | Status: DC

## 2014-04-12 NOTE — Patient Instructions (Signed)
Please take all new medication as prescribed  - the amlodipine 10 mg per day  OK to stop the spironolactone and potassium since it does not seem to be helping the blood pressure  Please continue all other medications as before, and refills have been done if requested - the metoprolol  Please have the pharmacy call with any other refills you may need.  If your BP is elevated, and your potassium becomes low again in the future, you may need to be evaluated for "Conn's syndrome"  Please see your PCP (Dr Ronnald Ramp) in 4 wks  OK to return to work tomorrow if you like, and Ok to begin to be more active such as treadmill exercise in the next few days
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
## 2014-04-12 NOTE — Progress Notes (Signed)
Pre visit review using our clinic review tool, if applicable. No additional management support is needed unless otherwise documented below in the visit note. 

## 2014-04-13 NOTE — Assessment & Plan Note (Signed)
[facility] lab ok , follow, work on wt loss, diet -  Lab Results  Component Value Date   WBC 7.0 04/06/2014   HGB 11.5* 04/06/2014   HCT 36.5 04/06/2014   PLT 303 04/06/2014   GLUCOSE 97 04/06/2014   ALT 14 01/27/2014   AST 15 01/27/2014   NA 138 04/06/2014   K 4.5 04/06/2014   CL 103 04/06/2014   CREATININE 0.72 04/06/2014   BUN 17 04/06/2014   CO2 25 04/06/2014   TSH 1.07 01/27/2014   INR 1.1* 02/20/2014   HGBA1C 5.9 01/05/2013

## 2014-04-13 NOTE — Assessment & Plan Note (Signed)
Uncontrolled, for add amlod 10, avoid ace/arb due to potential for pregnancy especially with no Birth control in place at this time, for f/u with PCP, suggest if have persistent elev BP and low potassium should be evaluated for Conn's syndrome

## 2014-04-13 NOTE — Assessment & Plan Note (Signed)
Ok to stop potassium and aldactone, would favor repeat K next visit

## 2014-04-21 ENCOUNTER — Ambulatory Visit (INDEPENDENT_AMBULATORY_CARE_PROVIDER_SITE_OTHER): Payer: BC Managed Care – PPO | Admitting: Emergency Medicine

## 2014-04-21 VITALS — BP 134/90 | HR 94 | Temp 97.9°F | Resp 16 | Ht 68.2 in | Wt 214.2 lb

## 2014-04-21 DIAGNOSIS — I10 Essential (primary) hypertension: Secondary | ICD-10-CM

## 2014-04-21 DIAGNOSIS — R609 Edema, unspecified: Secondary | ICD-10-CM

## 2014-04-21 MED ORDER — HYDROCHLOROTHIAZIDE 25 MG PO TABS: 25.0000 mg | ORAL_TABLET | Freq: Every day | ORAL | Status: DC

## 2014-04-21 NOTE — Patient Instructions (Signed)
Edema Edema is an abnormal build-up of fluids in tissues. Because this is partly dependent on gravity (water flows to the lowest place), it is more common in the legs and thighs (lower extremities). It is also common in the looser tissues, like around the eyes. Painless swelling of the feet and ankles is common and increases as a person ages. It may affect both legs and may include the calves or even thighs. When squeezed, the fluid may move out of the affected area and may leave a dent for a few moments. CAUSES   Prolonged standing or sitting in one place for extended periods of time. Movement helps pump tissue fluid into the veins, and absence of movement prevents this, resulting in edema.  Varicose veins. The valves in the veins do not work as well as they should. This causes fluid to leak into the tissues.  Fluid and salt overload.  Injury, burn, or surgery to the leg, ankle, or foot, may damage veins and allow fluid to leak out.  Sunburn damages vessels. Leaky vessels allow fluid to go out into the sunburned tissues.  Allergies (from insect bites or stings, medications or chemicals) cause swelling by allowing vessels to become leaky.  Protein in the blood helps keep fluid in your vessels. Low protein, as in malnutrition, allows fluid to leak out.  Hormonal changes, including pregnancy and menstruation, cause fluid retention. This fluid may leak out of vessels and cause edema.  Medications that cause fluid retention. Examples are sex hormones, blood pressure medications, steroid treatment, or anti-depressants.  Some illnesses cause edema, especially heart failure, kidney disease, or liver disease.  Surgery that cuts veins or lymph nodes, such as surgery done for the heart or for breast cancer, may result in edema. DIAGNOSIS  Your caregiver is usually easily able to determine what is causing your swelling (edema) by simply asking what is wrong (getting a history) and examining you (doing  a physical). Sometimes x-rays, EKG (electrocardiogram or heart tracing), and blood work may be done to evaluate for underlying medical illness. TREATMENT  General treatment includes:  Leg elevation (or elevation of the affected body part).  Restriction of fluid intake.  Prevention of fluid overload.  Compression of the affected body part. Compression with elastic bandages or support stockings squeezes the tissues, preventing fluid from entering and forcing it back into the blood vessels.  Diuretics (also called water pills or fluid pills) pull fluid out of your body in the form of increased urination. These are effective in reducing the swelling, but can have side effects and must be used only under your caregiver's supervision. Diuretics are appropriate only for some types of edema. The specific treatment can be directed at any underlying causes discovered. Heart, liver, or kidney disease should be treated appropriately. HOME CARE INSTRUCTIONS   Elevate the legs (or affected body part) above the level of the heart, while lying down.  Avoid sitting or standing still for prolonged periods of time.  Avoid putting anything directly under the knees when lying down, and do not wear constricting clothing or garters on the upper legs.  Exercising the legs causes the fluid to work back into the veins and lymphatic channels. This may help the swelling go down.  The pressure applied by elastic bandages or support stockings can help reduce ankle swelling.  A low-salt diet may help reduce fluid retention and decrease the ankle swelling.  Take any medications exactly as prescribed. SEEK MEDICAL CARE IF:  Your edema is   not responding to recommended treatments. SEEK IMMEDIATE MEDICAL CARE IF:   You develop shortness of breath or chest pain.  You cannot breathe when you lay down; or if, while lying down, you have to get up and go to the window to get your breath.  You are having increasing  swelling without relief from treatment.  You develop a fever over 102 F (38.9 C).  You develop pain or redness in the areas that are swollen.  Tell your caregiver right away if you have gained 03 lb/1.4 kg in 1 day or 05 lb/2.3 kg in a week. MAKE SURE YOU:   Understand these instructions.  Will watch your condition.  Will get help right away if you are not doing well or get worse. Document Released: 12/01/2005 Document Revised: 06/01/2012 Document Reviewed: 07/19/2008 [employer] Patient Information 2014 [employer].  
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
## 2014-04-22 ENCOUNTER — Encounter (HOSPITAL_COMMUNITY): Payer: Self-pay | Admitting: Emergency Medicine

## 2014-04-22 ENCOUNTER — Emergency Department (HOSPITAL_COMMUNITY)
Admission: EM | Admit: 2014-04-22 | Discharge: 2014-04-22 | Disposition: A | Discharge status: Home / Self Care | Payer: BC Managed Care – PPO | Source: Home / Self Care | Attending: Emergency Medicine | Admitting: Emergency Medicine

## 2014-04-22 DIAGNOSIS — R609 Edema, unspecified: Secondary | ICD-10-CM

## 2014-04-22 NOTE — ED Notes (Addendum)
Pt c/o swelling in ankles x 2 days. Pt reports she was prescribed HCTZ and took one dose with no relief. Feels like pins and needles. No numbness in toes. Reports it was painful but she took pain medicine today so she doesn't have pain. Pt is alert and oriented and in no acute distress.

## 2014-04-22 NOTE — ED Provider Notes (Signed)
Medical screening examination/treatment/procedure(s) were performed by non-physician practitioner and as supervising physician I was immediately available for consultation/collaboration.  Philipp Deputy, M.D.  Harden Mo, MD 04/22/14 [phone]
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
## 2014-04-22 NOTE — Discharge Instructions (Signed)
Follow up with your primary care doctor this week about your leg swelling. (Of course you can always choose to change doctors if you are not satisfied).  Take the hydrochlorothiazide 25mg  once daily in the morning. Purchase support stockings or hose and wear daily. Prop your feet up as much as you are able.    Peripheral Edema You have swelling in your legs (peripheral edema). This swelling is due to excess accumulation of salt and water in your body. Edema may be a sign of heart, kidney or liver disease, or a side effect of a medication. It may also be due to problems in the leg veins. Elevating your legs and using special support stockings may be very helpful, if the cause of the swelling is due to poor venous circulation. Avoid long periods of standing, whatever the cause. Treatment of edema depends on identifying the cause. Chips, pretzels, pickles and other salty foods should be avoided. Restricting salt in your diet is almost always needed. Water pills (diuretics) are often used to remove the excess salt and water from your body via urine. These medicines prevent the kidney from reabsorbing sodium. This increases urine flow. Diuretic treatment may also result in lowering of potassium levels in your body. Potassium supplements may be needed if you have to use diuretics daily. Daily weights can help you keep track of your progress in clearing your edema. You should call your caregiver for follow up care as recommended. SEEK IMMEDIATE MEDICAL CARE IF:   You have increased swelling, pain, redness, or heat in your legs.  You develop shortness of breath, especially when lying down.  You develop chest or abdominal pain, weakness, or fainting.  You have a fever. Document Released: 01/08/2005 Document Revised: 02/23/2012 Document Reviewed: 12/19/2009 [facility] Patient Information 2014 [place].

## 2014-04-26 ENCOUNTER — Ambulatory Visit (INDEPENDENT_AMBULATORY_CARE_PROVIDER_SITE_OTHER): Payer: BC Managed Care – PPO | Admitting: Internal Medicine

## 2014-04-26 ENCOUNTER — Encounter: Payer: Self-pay | Admitting: Internal Medicine

## 2014-04-26 ENCOUNTER — Other Ambulatory Visit (INDEPENDENT_AMBULATORY_CARE_PROVIDER_SITE_OTHER): Payer: BC Managed Care – PPO

## 2014-04-26 VITALS — BP 135/100 | HR 80 | Temp 97.0°F | Resp 16 | Ht 68.2 in | Wt 213.2 lb

## 2014-04-26 DIAGNOSIS — I10 Essential (primary) hypertension: Secondary | ICD-10-CM

## 2014-04-26 DIAGNOSIS — E876 Hypokalemia: Secondary | ICD-10-CM

## 2014-04-26 DIAGNOSIS — Z0000 Encounter for general adult medical examination without abnormal findings: Secondary | ICD-10-CM

## 2014-04-26 DIAGNOSIS — D509 Iron deficiency anemia, unspecified: Secondary | ICD-10-CM

## 2014-04-26 DIAGNOSIS — R609 Edema, unspecified: Secondary | ICD-10-CM | POA: Insufficient documentation

## 2014-04-26 LAB — CBC WITH DIFFERENTIAL/PLATELET
Basophils Absolute: 0 10*3/uL (ref 0.0–0.1)
Basophils Relative: 0.5 % (ref 0.0–3.0)
EOS PCT: 1.5 % (ref 0.0–5.0)
Eosinophils Absolute: 0.1 10*3/uL (ref 0.0–0.7)
HCT: 35.4 % — ABNORMAL LOW (ref 36.0–46.0)
HEMOGLOBIN: 11.5 g/dL — AB (ref 12.0–15.0)
Lymphocytes Relative: 30 % (ref 12.0–46.0)
Lymphs Abs: 1.7 10*3/uL (ref 0.7–4.0)
MCHC: 32.5 g/dL (ref 30.0–36.0)
MCV: 81.4 fl (ref 78.0–100.0)
MONOS PCT: 9.4 % (ref 3.0–12.0)
Monocytes Absolute: 0.5 10*3/uL (ref 0.1–1.0)
NEUTROS ABS: 3.3 10*3/uL (ref 1.4–7.7)
Neutrophils Relative %: 58.6 % (ref 43.0–77.0)
Platelets: 307 10*3/uL (ref 150.0–400.0)
RBC: 4.35 Mil/uL (ref 3.87–5.11)
RDW: 17.3 % — ABNORMAL HIGH (ref 11.5–15.5)
WBC: 5.7 10*3/uL (ref 4.0–10.5)

## 2014-04-26 LAB — URINALYSIS, ROUTINE W REFLEX MICROSCOPIC
Bilirubin Urine: NEGATIVE
Hgb urine dipstick: NEGATIVE
Ketones, ur: NEGATIVE
Leukocytes, UA: NEGATIVE
Nitrite: NEGATIVE
RBC / HPF: NONE SEEN (ref 0–?)
SPECIFIC GRAVITY, URINE: 1.02 (ref 1.000–1.030)
TOTAL PROTEIN, URINE-UPE24: NEGATIVE
UROBILINOGEN UA: 0.2 (ref 0.0–1.0)
Urine Glucose: NEGATIVE
pH: 6 (ref 5.0–8.0)

## 2014-04-26 MED ORDER — TRIAMTERENE-HCTZ 37.5-25 MG PO CAPS: 1.0000 | ORAL_CAPSULE | Freq: Every day | ORAL | Status: DC

## 2014-04-26 NOTE — Patient Instructions (Signed)
Preventive Care for Adults, Female A healthy lifestyle and preventive care can promote health and wellness. Preventive health guidelines for women include the following key practices.  A routine yearly physical is a good way to check with your health care provider about your health and preventive screening. It is a chance to share any concerns and updates on your health and to receive a thorough exam.  Visit your dentist for a routine exam and preventive care every 6 months. Brush your teeth twice a day and floss once a day. Good oral hygiene prevents tooth decay and gum disease.  The frequency of eye exams is based on your age, health, family medical history, use of contact lenses, and other factors. Follow your health care provider's recommendations for frequency of eye exams.  Eat a healthy diet. Foods like vegetables, fruits, whole grains, low-fat dairy products, and lean protein foods contain the nutrients you need without too many calories. Decrease your intake of foods high in solid fats, added sugars, and salt. Eat the right amount of calories for you.Get information about a proper diet from your health care provider, if necessary.  Regular physical exercise is one of the most important things you can do for your health. Most adults should get at least 150 minutes of moderate-intensity exercise (any activity that increases your heart rate and causes you to sweat) each week. In addition, most adults need muscle-strengthening exercises on 2 or more days a week.  Maintain a healthy weight. The body mass index (BMI) is a screening tool to identify possible weight problems. It provides an estimate of body fat based on height and weight. Your health care provider can find your BMI, and can help you achieve or maintain a healthy weight.For adults 20 years and older:  A BMI below 18.5 is considered underweight.  A BMI of 18.5 to 24.9 is normal.  A BMI of 25 to 29.9 is considered overweight.  A  BMI of 30 and above is considered obese.  Maintain normal blood lipids and cholesterol levels by exercising and minimizing your intake of saturated fat. Eat a balanced diet with plenty of fruit and vegetables. Blood tests for lipids and cholesterol should begin at age 62 and be repeated every 5 years. If your lipid or cholesterol levels are high, you are over 50, or you are at high risk for heart disease, you may need your cholesterol levels checked more frequently.Ongoing high lipid and cholesterol levels should be treated with medicines if diet and exercise are not working.  If you smoke, find out from your health care provider how to quit. If you do not use tobacco, do not start.  Lung cancer screening is recommended for adults aged 36 80 years who are at high risk for developing lung cancer because of a history of smoking. A yearly low-dose CT scan of the lungs is recommended for people who have at least a 30-pack-year history of smoking and are a current smoker or have quit within the past 15 years. A pack year of smoking is smoking an average of 1 pack of cigarettes a day for 1 year (for example: 1 pack a day for 30 years or 2 packs a day for 15 years). Yearly screening should continue until the smoker has stopped smoking for at least 15 years. Yearly screening should be stopped for people who develop a health problem that would prevent them from having lung cancer treatment.  If you are pregnant, do not drink alcohol. If you  are breastfeeding, be very cautious about drinking alcohol. If you are not pregnant and choose to drink alcohol, do not have more than 1 drink per day. One drink is considered to be 12 ounces (355 mL) of beer, 5 ounces (148 mL) of wine, or 1.5 ounces (44 mL) of liquor.  Avoid use of street drugs. Do not share needles with anyone. Ask for help if you need support or instructions about stopping the use of drugs.  High blood pressure causes heart disease and increases the risk  of stroke. Your blood pressure should be checked at least every 1 to 2 years. Ongoing high blood pressure should be treated with medicines if weight loss and exercise do not work.  If you are 62 39 years old, ask your health care provider if you should take aspirin to prevent strokes.  Diabetes screening involves taking a blood sample to check your fasting blood sugar level. This should be done once every 3 years, after age 62, if you are within normal weight and without risk factors for diabetes. Testing should be considered at a younger age or be carried out more frequently if you are overweight and have at least 1 risk factor for diabetes.  Breast cancer screening is essential preventive care for women. You should practice "breast self-awareness." This means understanding the normal appearance and feel of your breasts and may include breast self-examination. Any changes detected, no matter how small, should be reported to a health care provider. Women in their 45s and 30s should have a clinical breast exam (CBE) by a health care provider as part of a regular health exam every 1 to 3 years. After age 51, women should have a CBE every year. Starting at age 18, women should consider having a mammogram (breast X-ray test) every year. Women who have a family history of breast cancer should talk to their health care provider about genetic screening. Women at a high risk of breast cancer should talk to their health care providers about having an MRI and a mammogram every year.  Breast cancer gene (BRCA)-related cancer risk assessment is recommended for women who have family members with BRCA-related cancers. BRCA-related cancers include breast, ovarian, tubal, and peritoneal cancers. Having family members with these cancers may be associated with an increased risk for harmful changes (mutations) in the breast cancer genes BRCA1 and BRCA2. Results of the assessment will determine the need for genetic counseling  and BRCA1 and BRCA2 testing.  The Pap test is a screening test for cervical cancer. A Pap test can show cell changes on the cervix that might become cervical cancer if left untreated. A Pap test is a procedure in which cells are obtained and examined from the lower end of the uterus (cervix).  Women should have a Pap test starting at age 4.  Between ages 39 and 5, Pap tests should be repeated every 2 years.  Beginning at age 25, you should have a Pap test every 3 years as long as the past 3 Pap tests have been normal.  Some women have medical problems that increase the chance of getting cervical cancer. Talk to your health care provider about these problems. It is especially important to talk to your health care provider if a new problem develops soon after your last Pap test. In these cases, your health care provider may recommend more frequent screening and Pap tests.  The above recommendations are the same for women who have or have not gotten the vaccine  for human papillomavirus (HPV).  If you had a hysterectomy for a problem that was not cancer or a condition that could lead to cancer, then you no longer need Pap tests. Even if you no longer need a Pap test, a regular exam is a good idea to make sure no other problems are starting.  If you are between ages 58 and 10 years, and you have had normal Pap tests going back 10 years, you no longer need Pap tests. Even if you no longer need a Pap test, a regular exam is a good idea to make sure no other problems are starting.  If you have had past treatment for cervical cancer or a condition that could lead to cancer, you need Pap tests and screening for cancer for at least 20 years after your treatment.  If Pap tests have been discontinued, risk factors (such as a new sexual partner) need to be reassessed to determine if screening should be resumed.  The HPV test is an additional test that may be used for cervical cancer screening. The HPV test  looks for the virus that can cause the cell changes on the cervix. The cells collected during the Pap test can be tested for HPV. The HPV test could be used to screen women aged 67 years and older, and should be used in women of any age who have unclear Pap test results. After the age of 65, women should have HPV testing at the same frequency as a Pap test.  Colorectal cancer can be detected and often prevented. Most routine colorectal cancer screening begins at the age of 25 years and continues through age 66 years. However, your health care provider may recommend screening at an earlier age if you have risk factors for colon cancer. On a yearly basis, your health care provider may provide home test kits to check for hidden blood in the stool. Use of a small camera at the end of a tube, to directly examine the colon (sigmoidoscopy or colonoscopy), can detect the earliest forms of colorectal cancer. Talk to your health care provider about this at age 79, when routine screening begins. Direct exam of the colon should be repeated every 5 10 years through age 47 years, unless early forms of pre-cancerous polyps or small growths are found.  People who are at an increased risk for hepatitis B should be screened for this virus. You are considered at high risk for hepatitis B if:  You were born in a country where hepatitis B occurs often. Talk with your health care provider about which countries are considered high risk.  Your parents were born in a high-risk country and you have not received a shot to protect against hepatitis B (hepatitis B vaccine).  You have HIV or AIDS.  You use needles to inject street drugs.  You live with, or have sex with, someone who has Hepatitis B.  You get hemodialysis treatment.  You take certain medicines for conditions like cancer, organ transplantation, and autoimmune conditions.  Hepatitis C blood testing is recommended for all people born from [birth year] through 1965 and  any individual with known risks for hepatitis C.  Practice safe sex. Use condoms and avoid high-risk sexual practices to reduce the spread of sexually transmitted infections (STIs). STIs include gonorrhea, chlamydia, syphilis, trichomonas, herpes, HPV, and human immunodeficiency virus (HIV). Herpes, HIV, and HPV are viral illnesses that have no cure. They can result in disability, cancer, and death. Sexually active women aged 66  years and younger should be checked for chlamydia. Older women with new or multiple partners should also be tested for chlamydia. Testing for other STIs is recommended if you are sexually active and at increased risk.  Osteoporosis is a disease in which the bones lose minerals and strength with aging. This can result in serious bone fractures or breaks. The risk of osteoporosis can be identified using a bone density scan. Women ages 90 years and over and women at risk for fractures or osteoporosis should discuss screening with their health care providers. Ask your health care provider whether you should take a calcium supplement or vitamin D to reduce the rate of osteoporosis.  Menopause can be associated with physical symptoms and risks. Hormone replacement therapy is available to decrease symptoms and risks. You should talk to your health care provider about whether hormone replacement therapy is right for you.  Use sunscreen. Apply sunscreen liberally and repeatedly throughout the day. You should seek shade when your shadow is shorter than you. Protect yourself by wearing long sleeves, pants, a wide-brimmed hat, and sunglasses year round, whenever you are outdoors.  Once a month, do a whole body skin exam, using a mirror to look at the skin on your back. Tell your health care provider of new moles, moles that have irregular borders, moles that are larger than a pencil eraser, or moles that have changed in shape or color.  Stay current with required vaccines  (immunizations).  Influenza vaccine. All adults should be immunized every year.  Tetanus, diphtheria, and acellular pertussis (Td, Tdap) vaccine. Pregnant women should receive 1 dose of Tdap vaccine during each pregnancy. The dose should be obtained regardless of the length of time since the last dose. Immunization is preferred during the 27th 36th week of gestation. An adult who has not previously received Tdap or who does not know her vaccine status should receive 1 dose of Tdap. This initial dose should be followed by tetanus and diphtheria toxoids (Td) booster doses every 10 years. Adults with an unknown or incomplete history of completing a 3-dose immunization series with Td-containing vaccines should begin or complete a primary immunization series including a Tdap dose. Adults should receive a Td booster every 10 years.  Varicella vaccine. An adult without evidence of immunity to varicella should receive 2 doses or a second dose if she has previously received 1 dose. Pregnant females who do not have evidence of immunity should receive the first dose after pregnancy. This first dose should be obtained before leaving the health care facility. The second dose should be obtained 4 8 weeks after the first dose.  Human papillomavirus (HPV) vaccine. Females aged 61 26 years who have not received the vaccine previously should obtain the 3-dose series. The vaccine is not recommended for use in pregnant females. However, pregnancy testing is not needed before receiving a dose. If a female is found to be pregnant after receiving a dose, no treatment is needed. In that case, the remaining doses should be delayed until after the pregnancy. Immunization is recommended for any person with an immunocompromised condition through the age of 34 years if she did not get any or all doses earlier. During the 3-dose series, the second dose should be obtained 4 8 weeks after the first dose. The third dose should be obtained  24 weeks after the first dose and 16 weeks after the second dose.  Zoster vaccine. One dose is recommended for adults aged 12 years or older unless certain  conditions are present.  Measles, mumps, and rubella (MMR) vaccine. Adults born before [birth year] generally are considered immune to measles and mumps. Adults born in [birth year] or later should have 1 or more doses of MMR vaccine unless there is a contraindication to the vaccine or there is laboratory evidence of immunity to each of the three diseases. A routine second dose of MMR vaccine should be obtained at least 28 days after the first dose for students attending postsecondary schools, health care workers, or international travelers. People who received inactivated measles vaccine or an unknown type of measles vaccine during 1963 1967 should receive 2 doses of MMR vaccine. People who received inactivated mumps vaccine or an unknown type of mumps vaccine before 1979 and are at high risk for mumps infection should consider immunization with 2 doses of MMR vaccine. For females of childbearing age, rubella immunity should be determined. If there is no evidence of immunity, females who are not pregnant should be vaccinated. If there is no evidence of immunity, females who are pregnant should delay immunization until after pregnancy. Unvaccinated health care workers born before [birth year] who lack laboratory evidence of measles, mumps, or rubella immunity or laboratory confirmation of disease should consider measles and mumps immunization with 2 doses of MMR vaccine or rubella immunization with 1 dose of MMR vaccine.  Pneumococcal 13-valent conjugate (PCV13) vaccine. When indicated, a person who is uncertain of her immunization history and has no record of immunization should receive the PCV13 vaccine. An adult aged 42 years or older who has certain medical conditions and has not been previously immunized should receive 1 dose of PCV13 vaccine. This PCV13 should be followed  with a dose of pneumococcal polysaccharide (PPSV23) vaccine. The PPSV23 vaccine dose should be obtained at least 8 weeks after the dose of PCV13 vaccine. An adult aged 4 years or older who has certain medical conditions and previously received 1 or more doses of PPSV23 vaccine should receive 1 dose of PCV13. The PCV13 vaccine dose should be obtained 1 or more years after the last PPSV23 vaccine dose.  Pneumococcal polysaccharide (PPSV23) vaccine. When PCV13 is also indicated, PCV13 should be obtained first. All adults aged 27 years and older should be immunized. An adult younger than age 33 years who has certain medical conditions should be immunized. Any person who resides in a nursing home or long-term care facility should be immunized. An adult smoker should be immunized. People with an immunocompromised condition and certain other conditions should receive both PCV13 and PPSV23 vaccines. People with human immunodeficiency virus (HIV) infection should be immunized as soon as possible after diagnosis. Immunization during chemotherapy or radiation therapy should be avoided. Routine use of PPSV23 vaccine is not recommended for American Indians, [place] Natives, or people younger than 65 years unless there are medical conditions that require PPSV23 vaccine. When indicated, people who have unknown immunization and have no record of immunization should receive PPSV23 vaccine. One-time revaccination 5 years after the first dose of PPSV23 is recommended for people aged 13 64 years who have chronic kidney failure, nephrotic syndrome, asplenia, or immunocompromised conditions. People who received 1 2 doses of PPSV23 before age 66 years should receive another dose of PPSV23 vaccine at age 27 years or later if at least 5 years have passed since the previous dose. Doses of PPSV23 are not needed for people immunized with PPSV23 at or after age 33 years.  Meningococcal vaccine. Adults with asplenia or persistent complement  component deficiencies should receive 2  doses of quadrivalent meningococcal conjugate (MenACWY-D) vaccine. The doses should be obtained at least 2 months apart. Microbiologists working with certain meningococcal bacteria, [place] recruits, people at risk during an outbreak, and people who travel to or live in countries with a high rate of meningitis should be immunized. A first-year college student up through age 49 years who is living in a residence hall should receive a dose if she did not receive a dose on or after her 16th birthday. Adults who have certain high-risk conditions should receive one or more doses of vaccine.  Hepatitis A vaccine. Adults who wish to be protected from this disease, have certain high-risk conditions, work with hepatitis A-infected animals, work in hepatitis A research labs, or travel to or work in countries with a high rate of hepatitis A should be immunized. Adults who were previously unvaccinated and who anticipate close contact with an international adoptee during the first 60 days after arrival in the [place] from a country with a high rate of hepatitis A should be immunized.  Hepatitis B vaccine. Adults who wish to be protected from this disease, have certain high-risk conditions, may be exposed to blood or other infectious body fluids, are household contacts or sex partners of hepatitis B positive people, are clients or workers in certain care facilities, or travel to or work in countries with a high rate of hepatitis B should be immunized.  Haemophilus influenzae type b (Hib) vaccine. A previously unvaccinated person with asplenia or sickle cell disease or having a scheduled splenectomy should receive 1 dose of Hib vaccine. Regardless of previous immunization, a recipient of a hematopoietic stem cell transplant should receive a 3-dose series 6 12 months after her successful transplant. Hib vaccine is not recommended for adults with HIV infection. Preventive  Services / Frequency Ages 24 to 39years  Blood pressure check.** / Every 1 to 2 years.  Lipid and cholesterol check.** / Every 5 years beginning at age 66.  Clinical breast exam.** / Every 3 years for women in their 12s and 24s.  BRCA-related cancer risk assessment.** / For women who have family members with a BRCA-related cancer (breast, ovarian, tubal, or peritoneal cancers).  Pap test.** / Every 2 years from ages 31 through 69. Every 3 years starting at age 64 through age 76 or 89 with a history of 3 consecutive normal Pap tests.  HPV screening.** / Every 3 years from ages 10 through ages 10 to 96 with a history of 3 consecutive normal Pap tests.  Hepatitis C blood test.** / For any individual with known risks for hepatitis C.  Skin self-exam. / Monthly.  Influenza vaccine. / Every year.  Tetanus, diphtheria, and acellular pertussis (Tdap, Td) vaccine.** / Consult your health care provider. Pregnant women should receive 1 dose of Tdap vaccine during each pregnancy. 1 dose of Td every 10 years.  Varicella vaccine.** / Consult your health care provider. Pregnant females who do not have evidence of immunity should receive the first dose after pregnancy.  HPV vaccine. / 3 doses over 6 months, if 90 and younger. The vaccine is not recommended for use in pregnant females. However, pregnancy testing is not needed before receiving a dose.  Measles, mumps, rubella (MMR) vaccine.** / You need at least 1 dose of MMR if you were born in 1957 or later. You may also need a 2nd dose. For females of childbearing age, rubella immunity should be determined. If there is no evidence of immunity, females who are not  pregnant should be vaccinated. If there is no evidence of immunity, females who are pregnant should delay immunization until after pregnancy.  Pneumococcal 13-valent conjugate (PCV13) vaccine.** / Consult your health care provider.  Pneumococcal polysaccharide (PPSV23) vaccine.** / 1 to 2  doses if you smoke cigarettes or if you have certain conditions.  Meningococcal vaccine.** / 1 dose if you are age 21 to 59 years and a Market researcher living in a residence hall, or have one of several medical conditions, you need to get vaccinated against meningococcal disease. You may also need additional booster doses.  Hepatitis A vaccine.** / Consult your health care provider.  Hepatitis B vaccine.** / Consult your health care provider.  Haemophilus influenzae type b (Hib) vaccine.** / Consult your health care provider. Ages 11 to 64years  Blood pressure check.** / Every 1 to 2 years.  Lipid and cholesterol check.** / Every 5 years beginning at age 80 years.  Lung cancer screening. / Every year if you are aged 39 80 years and have a 30-pack-year history of smoking and currently smoke or have quit within the past 15 years. Yearly screening is stopped once you have quit smoking for at least 15 years or develop a health problem that would prevent you from having lung cancer treatment.  Clinical breast exam.** / Every year after age 63 years.  BRCA-related cancer risk assessment.** / For women who have family members with a BRCA-related cancer (breast, ovarian, tubal, or peritoneal cancers).  Mammogram.** / Every year beginning at age 47 years and continuing for as long as you are in good health. Consult with your health care provider.  Pap test.** / Every 3 years starting at age 41 years through age 6 or 65 years with a history of 3 consecutive normal Pap tests.  HPV screening.** / Every 3 years from ages 52 years through ages 55 to 9 years with a history of 3 consecutive normal Pap tests.  Fecal occult blood test (FOBT) of stool. / Every year beginning at age 77 years and continuing until age 72 years. You may not need to do this test if you get a colonoscopy every 10 years.  Flexible sigmoidoscopy or colonoscopy.** / Every 5 years for a flexible sigmoidoscopy or every  10 years for a colonoscopy beginning at age 80 years and continuing until age 79 years.  Hepatitis C blood test.** / For all people born from [birth year] through 1966 and any individual with known risks for hepatitis C.  Skin self-exam. / Monthly.  Influenza vaccine. / Every year.  Tetanus, diphtheria, and acellular pertussis (Tdap/Td) vaccine.** / Consult your health care provider. Pregnant women should receive 1 dose of Tdap vaccine during each pregnancy. 1 dose of Td every 10 years.  Varicella vaccine.** / Consult your health care provider. Pregnant females who do not have evidence of immunity should receive the first dose after pregnancy.  Zoster vaccine.** / 1 dose for adults aged 13 years or older.  Measles, mumps, rubella (MMR) vaccine.** / You need at least 1 dose of MMR if you were born in 1958 or later. You may also need a 2nd dose. For females of childbearing age, rubella immunity should be determined. If there is no evidence of immunity, females who are not pregnant should be vaccinated. If there is no evidence of immunity, females who are pregnant should delay immunization until after pregnancy.  Pneumococcal 13-valent conjugate (PCV13) vaccine.** / Consult your health care provider.  Pneumococcal polysaccharide (PPSV23) vaccine.** / 1  to 2 doses if you smoke cigarettes or if you have certain conditions.  Meningococcal vaccine.** / Consult your health care provider.  Hepatitis A vaccine.** / Consult your health care provider.  Hepatitis B vaccine.** / Consult your health care provider.  Haemophilus influenzae type b (Hib) vaccine.** / Consult your health care provider. Ages 57 years and over  Blood pressure check.** / Every 1 to 2 years.  Lipid and cholesterol check.** / Every 5 years beginning at age 53 years.  Lung cancer screening. / Every year if you are aged 55 80 years and have a 30-pack-year history of smoking and currently smoke or have quit within the past 15 years.  Yearly screening is stopped once you have quit smoking for at least 15 years or develop a health problem that would prevent you from having lung cancer treatment.  Clinical breast exam.** / Every year after age 35 years.  BRCA-related cancer risk assessment.** / For women who have family members with a BRCA-related cancer (breast, ovarian, tubal, or peritoneal cancers).  Mammogram.** / Every year beginning at age 26 years and continuing for as long as you are in good health. Consult with your health care provider.  Pap test.** / Every 3 years starting at age 62 years through age 80 or 74 years with 3 consecutive normal Pap tests. Testing can be stopped between 65 and 70 years with 3 consecutive normal Pap tests and no abnormal Pap or HPV tests in the past 10 years.  HPV screening.** / Every 3 years from ages 64 years through ages 79 or 110 years with a history of 3 consecutive normal Pap tests. Testing can be stopped between 65 and 70 years with 3 consecutive normal Pap tests and no abnormal Pap or HPV tests in the past 10 years.  Fecal occult blood test (FOBT) of stool. / Every year beginning at age 46 years and continuing until age 57 years. You may not need to do this test if you get a colonoscopy every 10 years.  Flexible sigmoidoscopy or colonoscopy.** / Every 5 years for a flexible sigmoidoscopy or every 10 years for a colonoscopy beginning at age 85 years and continuing until age 9 years.  Hepatitis C blood test.** / For all people born from [birth year] through 1966 and any individual with known risks for hepatitis C.  Osteoporosis screening.** / A one-time screening for women ages 75 years and over and women at risk for fractures or osteoporosis.  Skin self-exam. / Monthly.  Influenza vaccine. / Every year.  Tetanus, diphtheria, and acellular pertussis (Tdap/Td) vaccine.** / 1 dose of Td every 10 years.  Varicella vaccine.** / Consult your health care provider.  Zoster vaccine.** / 1  dose for adults aged 66 years or older.  Pneumococcal 13-valent conjugate (PCV13) vaccine.** / Consult your health care provider.  Pneumococcal polysaccharide (PPSV23) vaccine.** / 1 dose for all adults aged 51 years and older.  Meningococcal vaccine.** / Consult your health care provider.  Hepatitis A vaccine.** / Consult your health care provider.  Hepatitis B vaccine.** / Consult your health care provider.  Haemophilus influenzae type b (Hib) vaccine.** / Consult your health care provider. ** Family history and personal history of risk and conditions may change your health care provider's recommendations. Document Released: 01/27/2002 Document Revised: 09/21/2013 Document Reviewed: 04/28/2011 [facility] Patient Information 2014 [place], [place]. Hypertension As your heart beats, it forces blood through your arteries. This force is your blood pressure. If the pressure is too high, it is  called hypertension (HTN) or high blood pressure. HTN is dangerous because you may have it and not know it. High blood pressure may mean that your heart has to work harder to pump blood. Your arteries may be narrow or stiff. The extra work puts you at risk for heart disease, stroke, and other problems.  Blood pressure consists of two numbers, a higher number over a lower, 110/72, for example. It is stated as "110 over 72." The ideal is below 120 for the top number (systolic) and under 80 for the bottom (diastolic). Write down your blood pressure today. You should pay close attention to your blood pressure if you have certain conditions such as:  Heart failure.  Prior heart attack.  Diabetes  Chronic kidney disease.  Prior stroke.  Multiple risk factors for heart disease. To see if you have HTN, your blood pressure should be measured while you are seated with your arm held at the level of the heart. It should be measured at least twice. A one-time elevated blood pressure reading (especially in the  Emergency Department) does not mean that you need treatment. There may be conditions in which the blood pressure is different between your right and left arms. It is important to see your caregiver soon for a recheck. Most people have essential hypertension which means that there is not a specific cause. This type of high blood pressure may be lowered by changing lifestyle factors such as:  Stress.  Smoking.  Lack of exercise.  Excessive weight.  Drug/tobacco/alcohol use.  Eating less salt. Most people do not have symptoms from high blood pressure until it has caused damage to the body. Effective treatment can often prevent, delay or reduce that damage. TREATMENT  When a cause has been identified, treatment for high blood pressure is directed at the cause. There are a large number of medications to treat HTN. These fall into several categories, and your caregiver will help you select the medicines that are best for you. Medications may have side effects. You should review side effects with your caregiver. If your blood pressure stays high after you have made lifestyle changes or started on medicines,   Your medication(s) may need to be changed.  Other problems may need to be addressed.  Be certain you understand your prescriptions, and know how and when to take your medicine.  Be sure to follow up with your caregiver within the time frame advised (usually within two weeks) to have your blood pressure rechecked and to review your medications.  If you are taking more than one medicine to lower your blood pressure, make sure you know how and at what times they should be taken. Taking two medicines at the same time can result in blood pressure that is too low. SEEK IMMEDIATE MEDICAL CARE IF:  You develop a severe headache, blurred or changing vision, or confusion.  You have unusual weakness or numbness, or a faint feeling.  You have severe chest or abdominal pain, vomiting, or breathing  problems. MAKE SURE YOU:   Understand these instructions.  Will watch your condition.  Will get help right away if you are not doing well or get worse. Document Released: 12/01/2005 Document Revised: 02/23/2012 Document Reviewed: 07/21/2008 [facility] Patient Information 2014 [place].

## 2014-04-26 NOTE — Progress Notes (Signed)
Pre visit review using our clinic review tool, if applicable. No additional management support is needed unless otherwise documented below in the visit note. 

## 2014-04-27 ENCOUNTER — Emergency Department (HOSPITAL_COMMUNITY): Payer: BC Managed Care – PPO

## 2014-04-27 ENCOUNTER — Encounter (HOSPITAL_COMMUNITY): Payer: Self-pay | Admitting: Emergency Medicine

## 2014-04-27 ENCOUNTER — Telehealth: Payer: Self-pay | Admitting: Internal Medicine

## 2014-04-27 ENCOUNTER — Encounter: Payer: Self-pay | Admitting: Internal Medicine

## 2014-04-27 ENCOUNTER — Emergency Department (HOSPITAL_COMMUNITY)
Admission: EM | Admit: 2014-04-27 | Discharge: 2014-04-27 | Disposition: A | Discharge status: Home / Self Care | Payer: BC Managed Care – PPO | Attending: Emergency Medicine | Admitting: Emergency Medicine

## 2014-04-27 DIAGNOSIS — Z862 Personal history of diseases of the blood and blood-forming organs and certain disorders involving the immune mechanism: Secondary | ICD-10-CM | POA: Insufficient documentation

## 2014-04-27 DIAGNOSIS — R0789 Other chest pain: Secondary | ICD-10-CM | POA: Insufficient documentation

## 2014-04-27 DIAGNOSIS — J811 Chronic pulmonary edema: Secondary | ICD-10-CM | POA: Insufficient documentation

## 2014-04-27 DIAGNOSIS — I10 Essential (primary) hypertension: Secondary | ICD-10-CM | POA: Insufficient documentation

## 2014-04-27 DIAGNOSIS — R079 Chest pain, unspecified: Secondary | ICD-10-CM

## 2014-04-27 DIAGNOSIS — R609 Edema, unspecified: Secondary | ICD-10-CM | POA: Insufficient documentation

## 2014-04-27 DIAGNOSIS — R0989 Other specified symptoms and signs involving the circulatory and respiratory systems: Secondary | ICD-10-CM

## 2014-04-27 DIAGNOSIS — Z87828 Personal history of other (healed) physical injury and trauma: Secondary | ICD-10-CM | POA: Insufficient documentation

## 2014-04-27 DIAGNOSIS — Z79899 Other long term (current) drug therapy: Secondary | ICD-10-CM | POA: Insufficient documentation

## 2014-04-27 LAB — COMPREHENSIVE METABOLIC PANEL
ALT: 12 U/L (ref 0–35)
AST: 16 U/L (ref 0–37)
Albumin: 4.2 g/dL (ref 3.5–5.2)
Alkaline Phosphatase: 60 U/L (ref 39–117)
BILIRUBIN TOTAL: 0.5 mg/dL (ref 0.2–1.2)
BUN: 17 mg/dL (ref 6–23)
CO2: 29 meq/L (ref 19–32)
CREATININE: 0.8 mg/dL (ref 0.4–1.2)
Calcium: 9.5 mg/dL (ref 8.4–10.5)
Chloride: 99 mEq/L (ref 96–112)
GFR: 101.25 mL/min (ref >60.00)
Glucose, Bld: 89 mg/dL (ref 70–99)
Potassium: 3.1 mEq/L — ABNORMAL LOW (ref 3.5–5.1)
Sodium: 137 mEq/L (ref 135–145)
Total Protein: 7.7 g/dL (ref 6.0–8.3)

## 2014-04-27 LAB — FERRITIN: FERRITIN: 17.5 ng/mL (ref 10.0–291.0)

## 2014-04-27 LAB — CBC
HEMATOCRIT: 37.1 % (ref 36.0–46.0)
Hemoglobin: 11.8 g/dL — ABNORMAL LOW (ref 12.0–15.0)
MCH: 25.9 pg — AB (ref 26.0–34.0)
MCHC: 31.8 g/dL (ref 30.0–36.0)
MCV: 81.4 fL (ref 78.0–100.0)
Platelets: 322 10*3/uL (ref 150–400)
RBC: 4.56 MIL/uL (ref 3.87–5.11)
RDW: 16.4 % — ABNORMAL HIGH (ref 11.5–15.5)
WBC: 6 10*3/uL (ref 4.0–10.5)

## 2014-04-27 LAB — TSH: TSH: 0.93 u[IU]/mL (ref 0.35–4.50)

## 2014-04-27 LAB — BASIC METABOLIC PANEL
BUN: 21 mg/dL (ref 6–23)
CHLORIDE: 101 meq/L (ref 96–112)
CO2: 26 meq/L (ref 19–32)
Calcium: 10.1 mg/dL (ref 8.4–10.5)
Creatinine, Ser: 0.69 mg/dL (ref 0.50–1.10)
GFR calc Af Amer: >90 mL/min (ref >90)
GFR calc non Af Amer: >90 mL/min (ref >90)
GLUCOSE: 99 mg/dL (ref 70–99)
Potassium: 4 mEq/L (ref 3.7–5.3)
SODIUM: 141 meq/L (ref 137–147)

## 2014-04-27 LAB — LIPID PANEL
Cholesterol: 220 mg/dL — ABNORMAL HIGH (ref 0–200)
HDL: 45.2 mg/dL (ref >39.00)
LDL Cholesterol: 148 mg/dL — ABNORMAL HIGH (ref 0–99)
TRIGLYCERIDES: 133 mg/dL (ref 0.0–149.0)
Total CHOL/HDL Ratio: 5
VLDL: 26.6 mg/dL (ref 0.0–40.0)

## 2014-04-27 LAB — PRO B NATRIURETIC PEPTIDE: Pro B Natriuretic peptide (BNP): <5 pg/mL (ref 0–125)

## 2014-04-27 LAB — IBC PANEL
IRON: 54 ug/dL (ref 42–145)
Saturation Ratios: 12.5 % — ABNORMAL LOW (ref 20.0–50.0)
Transferrin: 308.5 mg/dL (ref 212.0–360.0)

## 2014-04-27 LAB — I-STAT TROPONIN, ED: Troponin i, poc: 0 ng/mL (ref 0.00–0.08)

## 2014-04-27 MED ORDER — ACETAMINOPHEN 325 MG PO TABS
650.0000 mg | ORAL_TABLET | Freq: Once | ORAL | Status: DC
  Filled 2014-04-27: qty 2

## 2014-04-27 MED ORDER — ASPIRIN 325 MG PO TABS
325.0000 mg | ORAL_TABLET | Freq: Once | ORAL | Status: AC
  Administered 2014-04-27: 325 mg via ORAL
  Filled 2014-04-27: qty 1

## 2014-04-27 MED ORDER — POTASSIUM CHLORIDE CRYS ER 20 MEQ PO TBCR
20.0000 meq | EXTENDED_RELEASE_TABLET | Freq: Every day | ORAL | Status: DC
Start: 2014-04-27 — End: 2014-11-15

## 2014-04-27 MED ORDER — ASPIRIN 325 MG PO TABS: 325.0000 mg | ORAL_TABLET | Freq: Once | ORAL | Status: DC

## 2014-04-27 MED ORDER — ACETAMINOPHEN 325 MG PO TABS
650.0000 mg | ORAL_TABLET | Freq: Once | ORAL | Status: AC
  Administered 2014-04-27: 650 mg via ORAL
  Filled 2014-04-27: qty 2

## 2014-04-27 MED ORDER — ACETAMINOPHEN 325 MG PO TABS
650.0000 mg | ORAL_TABLET | Freq: Once | ORAL | Status: DC
Start: 2014-04-27 — End: 2014-04-27

## 2014-04-27 MED ORDER — NITROGLYCERIN 0.4 MG SL SUBL: 0.4000 mg | SUBLINGUAL_TABLET | SUBLINGUAL | Status: DC | PRN

## 2014-04-27 MED ORDER — METHOCARBAMOL 500 MG PO TABS: 500.0000 mg | ORAL_TABLET | Freq: Three times a day (TID) | ORAL | Status: DC | PRN

## 2014-04-27 NOTE — Progress Notes (Signed)
Subjective:    Patient ID: Leslie [name], female    DOB: February 26, 1976, 38 y.o.   MRN: [id number]  Hypertension This is a chronic problem. The current episode started more than 1 year ago. The problem has been gradually worsening since onset. The problem is uncontrolled. Associated symptoms include peripheral edema (painful swelling in both feet and ankles). Pertinent negatives include no anxiety, blurred vision, chest pain, headaches, malaise/fatigue, neck pain, orthopnea, palpitations, PND, shortness of breath or sweats. Agents associated with hypertension include NSAIDs. Past treatments include diuretics, calcium channel blockers and beta blockers. The current treatment provides moderate improvement. Compliance problems include medication side effects, exercise and diet.   Anemia Presents for follow-up visit. There has been no abdominal pain, anorexia, bruising/bleeding easily, confusion, fever, leg swelling, light-headedness, malaise/fatigue, pallor, palpitations, paresthesias, pica or weight loss. Signs of blood loss that are present include vaginal bleeding. Signs of blood loss that are not present include hematemesis, hematochezia, melena and menorrhagia. Past treatments include nothing.      Review of Systems  Constitutional: Negative.  Negative for fever, chills, weight loss, malaise/fatigue, diaphoresis, appetite change and fatigue.  HENT: Negative.   Eyes: Negative.  Negative for blurred vision.  Respiratory: Negative.  Negative for apnea, cough, choking, chest tightness, shortness of breath, wheezing and stridor.   Cardiovascular: Negative.  Negative for chest pain, palpitations, orthopnea and PND.  Gastrointestinal: Negative.  Negative for nausea, vomiting, abdominal pain, diarrhea, constipation, blood in stool, melena, hematochezia, anorexia and hematemesis.  Endocrine: Negative.   Genitourinary: Positive for vaginal bleeding. Negative for dysuria, frequency, hematuria, flank pain,  vaginal discharge, difficulty urinating, vaginal pain, menstrual problem and menorrhagia.  Musculoskeletal: Negative for neck pain.  Skin: Negative.  Negative for pallor.  Allergic/Immunologic: Negative.   Neurological: Negative.  Negative for dizziness, tremors, seizures, weakness, light-headedness, numbness, headaches and paresthesias.  Hematological: Negative.  Negative for adenopathy. Does not bruise/bleed easily.  Psychiatric/Behavioral: Negative.  Negative for confusion.       Objective:   Physical Exam  Vitals reviewed. Constitutional: She is oriented to person, place, and time. She appears well-developed and well-nourished. No distress.  HENT:  Head: Normocephalic and atraumatic.  Mouth/Throat: Oropharynx is clear and moist. No oropharyngeal exudate.  Eyes: Conjunctivae are normal. Right eye exhibits no discharge. Left eye exhibits no discharge. No scleral icterus.  Neck: Normal range of motion. Neck supple. No JVD present. No tracheal deviation present. No thyromegaly present.  Cardiovascular: Normal rate, regular rhythm, normal heart sounds and intact distal pulses.  Exam reveals no gallop and no friction rub.   No murmur heard. Pulmonary/Chest: Effort normal and breath sounds normal. No stridor. No respiratory distress. She has no wheezes. She has no rales. She exhibits no tenderness.  Abdominal: Soft. Bowel sounds are normal. She exhibits no distension and no mass. There is no tenderness. There is no rebound and no guarding.  Musculoskeletal: Normal range of motion. She exhibits edema (1-2 + pitting edema in BLE). She exhibits no tenderness.  Lymphadenopathy:    She has no cervical adenopathy.  Neurological: She is oriented to person, place, and time.  Skin: Skin is warm and dry. No rash noted. She is not diaphoretic. No erythema. No pallor.  Psychiatric: She has a normal mood and affect. Her behavior is normal. Judgment and thought content normal.     Lab Results    Component Value Date   WBC 5.7 04/26/2014   HGB 11.5* 04/26/2014   HCT 35.4* 04/26/2014   PLT 307.0  04/26/2014   GLUCOSE 89 04/26/2014   CHOL 220* 04/26/2014   TRIG 133.0 04/26/2014   HDL 45.20 04/26/2014   LDLCALC 148* 04/26/2014   ALT 12 04/26/2014   AST 16 04/26/2014   NA 137 04/26/2014   K 3.1* 04/26/2014   CL 99 04/26/2014   CREATININE 0.8 04/26/2014   BUN 17 04/26/2014   CO2 29 04/26/2014   TSH 0.93 04/26/2014   INR 1.1* 02/20/2014   HGBA1C 5.9 01/05/2013       Assessment & Plan:

## 2014-04-27 NOTE — Assessment & Plan Note (Signed)
This is stable 

## 2014-04-27 NOTE — Assessment & Plan Note (Signed)
Starting dyazide will help with this but I have also asked her to start a low dose of K+ replacement therapy

## 2014-04-27 NOTE — Telephone Encounter (Signed)
Patient Information:  Caller Name: [facility]  Phone: [phone]  Patient: Leslie [name]  Gender: Female  DOB: 04-11-1976  Age: 38 Years  PCP: Scarlette Calico (Adults only)  Pregnant: No  Office Follow Up:  Does the office need to follow up with this patient?: No  Instructions For The Office: N/A  RN Note:  Advised patient to hang up and diall 911 due to severity of symptoms. She reports she will not call 911 and she is going to wait on her husband to get to her so he can take her to the hospital. Educated patient on the reason to dial 911 (cardiac symptoms) and she again refused and will have her husband take her to the hospital.  Symptoms  Reason For Call & Symptoms: Reports pain to the left side of the chest; feels short of breath when moving around. Reports pressure/tightening around her heart. Also reports she is unable to get up and walk at this time.  Reviewed Health History In EMR: Yes  Reviewed Medications In EMR: Yes  Reviewed Allergies In EMR: Yes  Reviewed Surgeries / Procedures: Yes  Date of Onset of Symptoms: 04/26/2014  Treatments Tried: HCTZ for swelling  Treatments Tried Worked: No OB / GYN:  LMP: 04/18/2014  Guideline(s) Used:  Chest Pain  Disposition Per Guideline:   Call EMS 911 Now  Reason For Disposition Reached:   Chest pain lasting longer than 5 minutes and ANY of the following:  Over 81 years old Over 81 years old and at least one cardiac risk factor (i.e., high blood pressure, diabetes, high cholesterol, obesity, smoker or strong family history of heart disease) Pain is crushing, pressure-like, or heavy  Took nitroglycerin and chest pain was not relieved History of heart disease (i.e., angina, heart attack, bypass surgery, angioplasty, CHF)  Advice Given:  N/A  Patient Refused Recommendation:  Patient Refused Care Advice  Will have husband take her to the hospital.

## 2014-04-27 NOTE — Assessment & Plan Note (Signed)
I will check her labs today to look for secondary causes of edema but it looks like the CCB and nsaids are to blame so I have asked her to stop both of these meds

## 2014-04-27 NOTE — ED Notes (Signed)
Lab draw unsuccessful, phlebotomist aware and to attempt.

## 2014-04-27 NOTE — ED Notes (Signed)
Pt reports no pain while at rest, reports "a little" when moving L arm.

## 2014-04-27 NOTE — ED Notes (Signed)
Initial Contact - pt to [facility] from triage with family, reports onset L sided CP last night while at rest.  Reports worsening with movement of L arm.  Denies SOB, dizziness, weakness, n/v, diaphoresis.  Speaking full/clear sentences, rr even/un-lab.  SR on monitor, no ectopy noted.  Skin PWD.  MAEI.  Placed to cardiac/02 monitor.  NAD.

## 2014-04-27 NOTE — Telephone Encounter (Signed)
Kerianna called from [phone] regarding chest pain or discomfort.  No answer at time of call back.  Left message on voice mail to call back, if assistance still needed.

## 2014-04-27 NOTE — Discharge Instructions (Signed)
Take robaxin as directed as needed for pain. Follow up with your primary care doctor.  Chest Pain (Nonspecific) It is often hard to give a specific diagnosis for the cause of chest pain. There is always a chance that your pain could be related to something serious, such as a heart attack or a blood clot in the lungs. You need to follow up with your caregiver for further evaluation. CAUSES   Heartburn.  Pneumonia or bronchitis.  Anxiety or stress.  Inflammation around your heart (pericarditis) or lung (pleuritis or pleurisy).  A blood clot in the lung.  A collapsed lung (pneumothorax). It can develop suddenly on its own (spontaneous pneumothorax) or from injury (trauma) to the chest.  Shingles infection (herpes zoster virus). The chest wall is composed of bones, muscles, and cartilage. Any of these can be the source of the pain.  The bones can be bruised by injury.  The muscles or cartilage can be strained by coughing or overwork.  The cartilage can be affected by inflammation and become sore (costochondritis). DIAGNOSIS  Lab tests or other studies, such as X-rays, electrocardiography, stress testing, or cardiac imaging, may be needed to find the cause of your pain.  TREATMENT   Treatment depends on what may be causing your chest pain. Treatment may include:  Acid blockers for heartburn.  Anti-inflammatory medicine.  Pain medicine for inflammatory conditions.  Antibiotics if an infection is present.  You may be advised to change lifestyle habits. This includes stopping smoking and avoiding alcohol, caffeine, and chocolate.  You may be advised to keep your head raised (elevated) when sleeping. This reduces the chance of acid going backward from your stomach into your esophagus.  Most of the time, nonspecific chest pain will improve within 2 to 3 days with rest and mild pain medicine. HOME CARE INSTRUCTIONS   If antibiotics were prescribed, take your antibiotics as  directed. Finish them even if you start to feel better.  For the next few days, avoid physical activities that bring on chest pain. Continue physical activities as directed.  Do not smoke.  Avoid drinking alcohol.  Only take over-the-counter or prescription medicine for pain, discomfort, or fever as directed by your caregiver.  Follow your caregiver's suggestions for further testing if your chest pain does not go away.  Keep any follow-up appointments you made. If you do not go to an appointment, you could develop lasting (chronic) problems with pain. If there is any problem keeping an appointment, you must call to reschedule. SEEK MEDICAL CARE IF:   You think you are having problems from the medicine you are taking. Read your medicine instructions carefully.  Your chest pain does not go away, even after treatment.  You develop a rash with blisters on your chest. SEEK IMMEDIATE MEDICAL CARE IF:   You have increased chest pain or pain that spreads to your arm, neck, jaw, back, or abdomen.  You develop shortness of breath, an increasing cough, or you are coughing up blood.  You have severe back or abdominal pain, feel nauseous, or vomit.  You develop severe weakness, fainting, or chills.  You have a fever. THIS IS AN EMERGENCY. Do not wait to see if the pain will go away. Get medical help at once. Call your local emergency services (911 in [place] Do not drive yourself to the hospital. MAKE SURE YOU:   Understand these instructions.  Will watch your condition.  Will get help right away if you are not doing well or  get worse. Document Released: 09/10/2005 Document Revised: 02/23/2012 Document Reviewed: 07/06/2008 [facility] Patient Information 2014 [place].  Pulmonary Edema Pulmonary edema (PE) is a condition in which fluid collects in the lungs. This makes it hard to breathe. PE may be a result of the heart not pumping very well or a result of injury.  CAUSES    Coronary artery disease causes blockages in the arteries of the heart. This deprives the heart muscle of oxygen and weakens the muscle. A heart attack is a form of coronary artery disease.  High blood pressure causes the heart muscle to work harder than usual. Over time, the heart muscle may get stiff, and it starts to work less efficiently. It may also fatigue and weaken.  Viral infection of the heart (myocarditis) may weaken the heart muscle.  Metabolic conditions such as thyroid disease, excessive alcohol use, certain vitamin deficiencies, or diabetes may also weaken the heart muscle.  Leaky or stiff heart valves may impair normal heart function.  Lung disease may strain the heart muscle.  Excessive demands on the heart such as too much salt or fluid intake.  Failure to take prescribed medicines.  Lung injury from heat or toxins, such as poisonous gas.  Infection in the lungs or other parts of the body.  Fluid overload caused by kidney failure or medicines. SYMPTOMS   Shortness of breath at rest or with exertion.  Grunting, wheezing, or gurgling while breathing.  Feeling like you cannot get enough air.  Breaths are shallow and fast.  A lot of coughing with frothy or bloody mucus.  Skin may become cool, damp, and turn a pale or bluish color. DIAGNOSIS  Initial diagnosis may be based on your history, symptoms, and a physical examination. Additional tests for PE may include:  Electrocardiography.  Chest X-ray.  Blood tests.  Stress test.  Ultrasound evaluation of the heart (echocardiography).  Evaluation by a heart doctor (cardiologist).  Test of the heart arteries to look for blockages (angiography).  Check of blood oxygen. TREATMENT  Treatment of PE will depend on the underlying cause and will focus first on relieving the symptoms.   Extra oxygen to make breathing easier and assist with removing mucus. This may include breathing treatments or a tube into  the lungs and a breathing machine.  Medicine to help the body get rid of extra water, usually through an IV tube.  Medicine to help the heart pump better.  If poor heart function is the cause, treatment may include:  Procedures to open blocked arteries, repair damaged heart valves, or remove some of the damaged heart muscle.  A pacemaker to help the heart pump with less effort. HOME CARE INSTRUCTIONS   Your health care provider will help you determine what type of exercise program may be helpful. It is important to maintain strength and increase it if possible. Pace your activities to avoid shortness of breath or chest pain. Rest for at least 1 hour before and after meals. Cardiac rehabilitation programs are available in some locations.  Eat a heart healthy diet low in salt, saturated fat, and cholesterol. Ask for help with choices.  Make a list of every medicine, vitamin, or herbal supplement you are taking. Keep the list with you at all times. Show it to your health care provider at every visit and before starting a new medicine. Keep the list up to date.  Ask your health care provider or pharmacist to help you write a plan or schedule so that  you know things about each medicine such as:  Why you are taking it.  The possible side effects.  The best time of day to take it.  Foods to take with it or avoid.  When to stop taking it.  Record your hospital or clinic weight. When you get home, compare it to your scale and record your weight. Then, weigh yourself first thing in the morning daily, and record the weights. You should weigh yourself every morning after you urinate and before you eat breakfast. Wear the same amount of clothing each time you weigh yourself. Provide your health care provider with your weight record. Daily weights are important in the early recognition of excess fluid. Tell your health care provider right away if you have gained 03 lb/1.4 kg in 1 day, 05 lb/2.3 kg  in a week or as directed by your health care provider. Your medicines may need to be adjusted.  Blood pressure monitoring should be done as often as directed. You can get a home blood pressure cuff at your drugstore. Record these values and bring them with you for your clinic visits. Notify your health care provider if you become dizzy or lightheaded when standing up.  If you are currently a smoker, it is time to quit. Nicotine makes your heart work harder and is one of the leading causes of cardiac deaths. Do not use nicotine gum or patches before talking to your doctor.  Make a follow-up appointment with your health care provider as directed.  Ask your health care provider for a copy of your latest heart tracing (ECG) and keep a copy with you at all times. SEEK IMMEDIATE MEDICAL CARE IF:   You have severe chest pain, especially if the pain is crushing or pressure-like and spreads to the arms, back, neck, or jaw. THIS IS AN EMERGENCY. Do not wait to see if the pain will go away. Call for local emergency medical help. Do not drive yourself to the hospital.  You have sweating, feel sick to your stomach (nauseous), or are experiencing shortness of breath.  Your weight increases by 03 lb/1.4 kg in 1 day or 05 lb/2.3 kg in a week.  You notice increasing shortness of breath that is unusual for you. This may happen during rest, sleep, or with activity.  You develop chest pain (angina) or pain that is unusual for you.  You notice more swelling in your hands, feet, ankles or abdomen.  You notice lasting (persistent) dizziness, blurred vision, headache, or unsteadiness.  You begin to cough up bloody mucus (sputum).  You are unable to sleep because it is hard to breathe.  You begin to feel a "jumping" or "fluttering" sensation (palpitations) in the chest that is unusual for you. MAKE SURE YOU:  Understand these instructions.  Will watch your condition  Will get help right away if you are  not doing well or get worse. Document Released: 02/21/2003 Document Revised: 09/21/2013 Document Reviewed: 08/08/2013 [facility] Patient Information 2014 [place].

## 2014-04-27 NOTE — Telephone Encounter (Signed)
Patient Information:  Caller Name: [place]  Phone: [phone]  Patient: Leslie [name]  Gender: Female  DOB: Dec 17, 1975  Age: 38 Years  PCP: Scarlette Calico (Adults only)  Pregnant: No  Office Follow Up:  Does the office need to follow up with this patient?: Yes  Instructions For The Office: Please contact patient . Tightness to chest after taking HCTZ. PLEASE CONTACT PATIENT REGARDNG SYMPTOMS AND ED VS OFFICE DISPOSITION  RN Note:  Please contact patient . Tightness to chest after taking HCTZ. PLEASE CONTACT PATIENT REGARIDNG SYMPTOMS.  Symptoms  Reason For Call & Symptoms: Patient states she is having a tightness of chest and chest pain Intermittent. . Onset yesterday 04/26/14.  She was seen in the office yesterday for swelling of feet. She was evaluated by Dr. Ronnald Ramp.  It was thought to be a reaction Norvasc.  He stopped the Norvasc and placed her on HCTZ.  She took the pill at 7pm.  She ate dinner and took a nap. She states she was very sleepy.  She began to feel a tightness in her chest and going to bathroom frequently. Feet are better and she is able to walk but feels a  "squeezing my heart". Intermittent.  She states she slept throught the night.  When she awoke this morning with same symptoms.  Reviewed Health History In EMR: Yes  Reviewed Medications In EMR: Yes  Reviewed Allergies In EMR: Yes  Reviewed Surgeries / Procedures: Yes  Date of Onset of Symptoms: 04/26/2014 OB / GYN:  LMP: 04/20/2014  Guideline(s) Used:  Chest Pain  Disposition Per Guideline:   Go to ED Now (or to Office with PCP Approval)  Reason For Disposition Reached:   Chest pain lasting longer than 5 minutes  Advice Given:  Call Back If:  Severe chest pain  Constant chest pain lasting longer than 5 minutes  Difficulty breathing  You become worse.  RN Overrode Recommendation:  Go To ED  Please contact patient . Tightness to chest after taking HCTZ. PLEASE CONTACT PATIENT REGARIDNG SYMPTOMS.

## 2014-04-27 NOTE — ED Provider Notes (Signed)
Medical screening examination/treatment/procedure(s) were conducted as a shared visit with non-physician practitioner(s) and myself.  I personally evaluated the patient during the encounter.   EKG Interpretation   Date/Time:  Thursday Apr 27 2014 13:28:04 EDT Ventricular Rate:  84 PR Interval:  141 QRS Duration: 89 QT Interval:  374 QTC Calculation: 442 R Axis:   28 Text Interpretation:  Sinus rhythm Anteroseptal infarct, old No  significant change since last tracing Confirmed by Zelpha Messing  MD, Cabe Lashley  ([id number]) on 04/27/2014 5:45:30 PM      Patient here with persistent chest tightness since taking a dose of hydrochlorothiazide. No evidence of ACS at this time. No concern for pulmonary embolism. Pain is worse when she moves her arm and will place him [place] relaxants and discharged home  Leota Jacobsen, MD 04/27/14 1746

## 2014-04-27 NOTE — ED Notes (Signed)
Pt c/o inc pain to central chest.  EDPA notified, awaiting orders.
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
## 2014-04-27 NOTE — Assessment & Plan Note (Signed)
Her BP is not well controlled I have asked her to stop the CCB (side effects) and the nsaids Will upgrade her diuretic to dyazide She will cont on the beta-blocker

## 2014-04-27 NOTE — ED Notes (Addendum)
Pt c/o left sided chest pain x 1 day, denies headache, n/v, lightheaded. Chest hurts with movement

## 2014-05-01 NOTE — ED Provider Notes (Signed)
Medical screening examination/treatment/procedure(s) were performed by non-physician practitioner and as supervising physician I was immediately available for consultation/collaboration.  Leota Jacobsen, MD 05/01/14 [phone]

## 2014-05-03 ENCOUNTER — Ambulatory Visit (INDEPENDENT_AMBULATORY_CARE_PROVIDER_SITE_OTHER): Payer: BC Managed Care – PPO | Admitting: Internal Medicine

## 2014-05-03 ENCOUNTER — Encounter: Payer: Self-pay | Admitting: Internal Medicine

## 2014-05-03 VITALS — BP 138/88 | HR 80 | Temp 97.7°F | Resp 16 | Ht 68.0 in | Wt 210.0 lb

## 2014-05-03 DIAGNOSIS — I10 Essential (primary) hypertension: Secondary | ICD-10-CM

## 2014-05-03 DIAGNOSIS — R609 Edema, unspecified: Secondary | ICD-10-CM

## 2014-05-03 DIAGNOSIS — I517 Cardiomegaly: Secondary | ICD-10-CM

## 2014-05-03 MED ORDER — METOPROLOL SUCCINATE ER 50 MG PO TB24: 50.0000 mg | ORAL_TABLET | Freq: Every day | ORAL | Status: DC

## 2014-05-03 NOTE — Progress Notes (Signed)
Subjective:    Patient ID: Leslie [name], female    DOB: [birth year], 38 y.o.   MRN: [id number]  HPI Comments: She was seen in the ER last week for CP, the pain resolved after a few doses of a muscle relaxer. She was told that "she has fluid on her lungs" and is therefore very concerned about that.  Hypertension This is a chronic problem. The current episode started more than 1 year ago. The problem has been rapidly improving since onset. The problem is controlled. Pertinent negatives include no anxiety, blurred vision, chest pain, headaches, malaise/fatigue, neck pain, orthopnea, palpitations, peripheral edema, PND, shortness of breath or sweats. Past treatments include beta blockers and diuretics. The current treatment provides moderate improvement. Compliance problems include diet and exercise.       Review of Systems  Constitutional: Negative.  Negative for fever, chills, malaise/fatigue, diaphoresis, appetite change and fatigue.  HENT: Negative.   Eyes: Negative.  Negative for blurred vision.  Respiratory: Negative.  Negative for cough, choking, chest tightness, shortness of breath and stridor.   Cardiovascular: Negative.  Negative for chest pain, palpitations, orthopnea, leg swelling and PND.  Gastrointestinal: Negative.  Negative for nausea, vomiting, abdominal pain, diarrhea, constipation and blood in stool.  Endocrine: Negative.   Genitourinary: Negative.   Musculoskeletal: Negative.  Negative for arthralgias, back pain, myalgias and neck pain.  Skin: Negative.   Allergic/Immunologic: Negative.   Neurological: Negative.  Negative for dizziness, tremors, weakness, light-headedness, numbness and headaches.  Hematological: Negative.  Negative for adenopathy. Does not bruise/bleed easily.  Psychiatric/Behavioral: Negative.        Objective:   Physical Exam  Vitals reviewed. Constitutional: She is oriented to person, place, and time. She appears well-developed and  well-nourished. No distress.  HENT:  Head: Normocephalic and atraumatic.  Mouth/Throat: Oropharynx is clear and moist. No oropharyngeal exudate.  Eyes: Conjunctivae are normal. Right eye exhibits no discharge. Left eye exhibits no discharge. No scleral icterus.  Neck: Normal range of motion. Neck supple. No JVD present. No tracheal deviation present. No thyromegaly present.  Cardiovascular: Normal rate, regular rhythm, normal heart sounds and intact distal pulses.  Exam reveals no gallop and no friction rub.   No murmur heard. Pulmonary/Chest: Effort normal and breath sounds normal. No stridor. No respiratory distress. She has no wheezes. She has no rales. She exhibits no tenderness.  Abdominal: Soft. Bowel sounds are normal. She exhibits no distension and no mass. There is no tenderness. There is no rebound and no guarding.  Musculoskeletal: Normal range of motion. She exhibits no edema and no tenderness.  Lymphadenopathy:    She has no cervical adenopathy.  Neurological: She is oriented to person, place, and time.  Skin: Skin is warm and dry. No rash noted. She is not diaphoretic. No erythema. No pallor.  Psychiatric: She has a normal mood and affect. Her behavior is normal. Judgment and thought content normal.     Lab Results  Component Value Date   WBC 6.0 04/27/2014   HGB 11.8* 04/27/2014   HCT 37.1 04/27/2014   PLT 322 04/27/2014   GLUCOSE 99 04/27/2014   CHOL 220* 04/26/2014   TRIG 133.0 04/26/2014   HDL 45.20 04/26/2014   LDLCALC 148* 04/26/2014   ALT 12 04/26/2014   AST 16 04/26/2014   NA 141 04/27/2014   K 4.0 04/27/2014   CL 101 04/27/2014   CREATININE 0.69 04/27/2014   BUN 21 04/27/2014   CO2 26 04/27/2014   TSH 0.93 04/26/2014  INR 1.1* 02/20/2014   HGBA1C 5.9 01/05/2013       Assessment & Plan:

## 2014-05-03 NOTE — Assessment & Plan Note (Signed)
There is no edema today

## 2014-05-03 NOTE — Progress Notes (Signed)
Pre visit review using our clinic review tool, if applicable. No additional management support is needed unless otherwise documented below in the visit note. 

## 2014-05-03 NOTE — Assessment & Plan Note (Signed)
Her BP is well controlled 

## 2014-05-03 NOTE — Assessment & Plan Note (Addendum)
The CXR shows cardiomegaly but her BPN was < 5 so I don't think the Xray finding is significant None the less, she is worried about this so I have sent a referral to cardiology to consider further evaluation

## 2014-05-03 NOTE — Patient Instructions (Signed)

## 2014-05-04 ENCOUNTER — Telehealth: Payer: Self-pay | Admitting: *Deleted

## 2014-05-04 MED ORDER — METHOCARBAMOL 500 MG PO TABS: 500.0000 mg | ORAL_TABLET | Freq: Three times a day (TID) | ORAL | Status: DC | PRN

## 2014-05-04 NOTE — Telephone Encounter (Signed)
done

## 2014-05-04 NOTE — Telephone Encounter (Signed)
Pt called requesting Robaxin refill.  Pt states Rx filled in ER.  Please advise

## 2014-05-05 NOTE — Telephone Encounter (Signed)
Left message on VM advising Rx sent.  Please advise

## 2014-06-14 ENCOUNTER — Encounter: Payer: Self-pay | Admitting: Internal Medicine

## 2014-06-15 ENCOUNTER — Telehealth: Payer: Self-pay | Admitting: *Deleted

## 2014-06-15 NOTE — Telephone Encounter (Signed)
Left message on voice mail for the patient to return my call regarding upcoming appt.

## 2014-06-19 ENCOUNTER — Ambulatory Visit: Payer: BC Managed Care – PPO | Admitting: Internal Medicine

## 2014-06-22 ENCOUNTER — Ambulatory Visit: Payer: BC Managed Care – PPO | Admitting: Cardiology

## 2014-06-27 ENCOUNTER — Other Ambulatory Visit: Payer: Self-pay | Admitting: *Deleted

## 2014-06-28 ENCOUNTER — Ambulatory Visit (INDEPENDENT_AMBULATORY_CARE_PROVIDER_SITE_OTHER): Payer: BC Managed Care – PPO | Admitting: Cardiology

## 2014-06-28 ENCOUNTER — Encounter: Payer: Self-pay | Admitting: Cardiology

## 2014-06-28 VITALS — BP 144/100 | HR 80 | Ht 68.0 in | Wt 215.0 lb

## 2014-06-28 DIAGNOSIS — I10 Essential (primary) hypertension: Secondary | ICD-10-CM

## 2014-06-28 DIAGNOSIS — R0602 Shortness of breath: Secondary | ICD-10-CM

## 2014-06-28 DIAGNOSIS — I517 Cardiomegaly: Secondary | ICD-10-CM

## 2014-06-28 MED ORDER — LABETALOL HCL 200 MG PO TABS: 200.0000 mg | ORAL_TABLET | Freq: Two times a day (BID) | ORAL | Status: DC

## 2014-06-28 NOTE — Patient Instructions (Addendum)
Stop metoprolol  Start labetalol 200mg  two times a day.  Your physician recommends that you return for lab work today--BMET/BNP/TSH.  Dr Aundra Dubin recommends that you walk 5-6 times a week for 20-30 minutes.  Your physician recommends that you schedule a follow-up appointment in: 1 month with Dr Aundra Dubin.

## 2014-06-29 LAB — BASIC METABOLIC PANEL
BUN: 13 mg/dL (ref 6–23)
CHLORIDE: 106 meq/L (ref 96–112)
CO2: 26 mEq/L (ref 19–32)
Calcium: 9.4 mg/dL (ref 8.4–10.5)
Creatinine, Ser: 0.9 mg/dL (ref 0.4–1.2)
GFR: 95.69 mL/min (ref >60.00)
GLUCOSE: 84 mg/dL (ref 70–99)
POTASSIUM: 4.4 meq/L (ref 3.5–5.1)
SODIUM: 137 meq/L (ref 135–145)

## 2014-06-29 LAB — BRAIN NATRIURETIC PEPTIDE: PRO B NATRI PEPTIDE: 22 pg/mL (ref 0.0–100.0)

## 2014-06-29 LAB — TSH: TSH: 0.66 u[IU]/mL (ref 0.35–4.50)

## 2014-06-30 NOTE — Progress Notes (Signed)
Patient ID: Leslie [name], female   DOB: 05-25-1976, 38 y.o.   MRN: [id number] PCP: Dr. Ronie Spies [facility])  38 yo with history of HTN for a number of years presents for cardiology evaluation of cardiomegaly by CXR.  Patient has HTN that had been reasonably controlled with Diovan but is now off this medication as she is trying to become pregnant.  She is taking Toprol XL only, and BP is running high (144/100 today).  She has had HTN for years, and both her parents have hypertension.    In 4/15, patient had surgery to remove uterine fibroids. Says "they pumped me full of IV fluid" around the time of the surgery, and she noted swelling. Shortly after this, she developed pain across her chest. She went to the ER and was given muscle relaxers, with resolution of the pain.  On CXR, however, cardiomegaly was described as well as pulmonary venous congestion. Currently, she notes that her exercise tolerance is not as robust as it was a few months ago.  She gets some dyspnea walking up stairs or walking longer distances outside.  Occasional fleeting atypical chest pain.  No orthopnea/PND.  She is working full time as a Pharmacist, hospital at [employer].   Apparently, since her ER visit, she had an echocardiogram done at her primary care physician's office.   ECG: NSR, normal  Labs (5/15): BNP < 5, K 4, creatinine 0.69, LDL 148, HDL 45  PMH: 1. HTN: x years: peripheral edema with amlodipine 2. Fibroid surgery 4/15  FH: Both parents with HTN, father with CAD and aortic valve replacement.   SH: Pharmacist, hospital at [employer], nonsmoker, married  ROS: All systems reviewed and negative except as per HPI.   Current Outpatient Prescriptions  Medication Sig Dispense Refill  . cetirizine (ZYRTEC) 10 MG tablet Take 10 mg by mouth daily.      Marland Kitchen labetalol (NORMODYNE) 200 MG tablet Take 1 tablet (200 mg total) by mouth 2 (two) times daily.  60 tablet  2  . methocarbamol (ROBAXIN) 500 MG tablet Take 1 tablet (500  mg total) by mouth every 8 (eight) hours as needed for muscle spasms.  60 tablet  0  . potassium chloride SA (K-DUR,KLOR-CON) 20 MEQ tablet Take 1 tablet (20 mEq total) by mouth daily.  30 tablet  1  . triamterene-hydrochlorothiazide (DYAZIDE) 37.5-25 MG per capsule Take 1 each (1 capsule total) by mouth daily.  90 capsule  1   No current facility-administered medications for this visit.    BP 144/100  Pulse 80  Ht 5\' 8"  (1.727 m)  Wt 215 lb (97.523 kg)  BMI 32.70 kg/m2 General: NAD Neck: No JVD, no thyromegaly or thyroid nodule.  Lungs: Clear to auscultation bilaterally with normal respiratory effort. CV: Nondisplaced PMI.  Heart regular S1/S2, no S3/S4, no murmur.  No peripheral edema.  No carotid bruit.  Normal pedal pulses.  Abdomen: Soft, nontender, no hepatosplenomegaly, no distention.  Skin: Intact without lesions or rashes.  Neurologic: Alert and oriented x 3.  Psych: Normal affect. Extremities: No clubbing or cyanosis.  HEENT: Normal.   Assessment/Plan:  1. HTN: Uncontrolled currently.  She is off Diovan as she is trying to become pregnant.  She was unable to tolerate amlodipine in the past due to peripheral edema.  She is on Toprol XL currently.  She has HTN at a young age.  Suspect this is familial as both parents apparently developed HTN relatively early on.   - For now,  would stop Toprol XL and use labetalol 200 mg bid instead.  This can be titrated up and should be relatively safe with pregnancy.  2. Cardiomegaly/pulmonary venous congestion on CXR: On exam today, she is not volume overloaded.  BNP done a couple of months ago was not elevated.  It is possible the CXR abnormality may due to the fact it was a portable CXR. Would also consider that she had recently had surgery and had been given a lot of IV fluid apparently, which could lead to the pulmonary venous congestion if not cleared fully.  Her exercise tolerance is not as good as several months ago, but I wonder if this  is not because of some deconditioning since her fibroid surgery.  She has not been out exercising as much as she used to be.  - I will try to get a copy of the echocardiogram done at her PCP's office to review it. - I will repeat BNP and will check TSH.  - I encouraged her to start a regular exercise regimen, walking 5-6 days a week and building up to 30 minutes at a time.   Loralie Champagne 06/30/2014

## 2014-06-30 NOTE — Progress Notes (Signed)
Quick Note:  Preliminary report reviewed by triage nurse and sent to MD desk. ______ 

## 2014-07-03 NOTE — Addendum Note (Signed)
Addended by: Katrine Coho on: 07/03/2014 08:20 AM   Modules accepted: Orders

## 2014-07-04 ENCOUNTER — Telehealth: Payer: Self-pay | Admitting: Cardiology

## 2014-07-04 NOTE — Telephone Encounter (Signed)
ROI faxed to [facility] 7.21.15/km

## 2014-07-04 NOTE — Telephone Encounter (Signed)
Echo rec From [facility] gave to Operator Dept   7.21.15/km

## 2014-07-13 ENCOUNTER — Emergency Department (INDEPENDENT_AMBULATORY_CARE_PROVIDER_SITE_OTHER)
Admission: EM | Admit: 2014-07-13 | Discharge: 2014-07-13 | Disposition: A | Discharge status: Home / Self Care | Payer: BC Managed Care – PPO | Source: Home / Self Care | Attending: Family Medicine | Admitting: Family Medicine

## 2014-07-13 ENCOUNTER — Emergency Department (INDEPENDENT_AMBULATORY_CARE_PROVIDER_SITE_OTHER): Payer: BC Managed Care – PPO

## 2014-07-13 ENCOUNTER — Encounter (HOSPITAL_COMMUNITY): Payer: Self-pay | Admitting: Emergency Medicine

## 2014-07-13 DIAGNOSIS — S90851A Superficial foreign body, right foot, initial encounter: Secondary | ICD-10-CM

## 2014-07-13 DIAGNOSIS — IMO0002 Reserved for concepts with insufficient information to code with codable children: Secondary | ICD-10-CM

## 2014-07-13 MED ORDER — CLINDAMYCIN HCL 300 MG PO CAPS: 300.0000 mg | ORAL_CAPSULE | Freq: Three times a day (TID) | ORAL | Status: DC
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
## 2014-07-13 NOTE — Discharge Instructions (Signed)
Sliver Removal °You have had a sliver (splinter) removed. This has caused a wound that extends through some or all layers of the skin and possibly into the subcutaneous tissue. This is the tissue just beneath the skin. Because these wounds can not be cleaned well, it is necessary to watch closely for infection. °AFTER THE PROCEDURE  °If a cut (incision) was necessary to remove this, it may have been repaired for you by your caregiver either with suturing, stapling, or adhesive strips. These keep together the skin edges and allow better and faster healing. °HOME CARE INSTRUCTIONS  °· A dressing may have been applied. This may be changed once per day or as instructed. If the dressing sticks, it may be soaked off with a gauze pad or clean cloth that has been dampened with soapy water or hydrogen peroxide. °· It is difficult to remove all slivers or foreign bodies as they may break or splinter into smaller pieces. Be aware that your body will work to remove the foreign substance. That is, the foreign body may work itself out of the wound. That is normal. °· Watch for signs of infection and notify your caregiver if you suspect a sliver or foreign body remains in the wound. °· You may have received a recommendation to follow up with your physician or a specialist. It is very important to call for or keep follow-up appointments in order to avoid infection or other complications. °· Only take over-the-counter or prescription medicines for pain, discomfort, or fever as directed by your caregiver. °· If antibiotics were prescribed, be sure to finish all of the medicine. °If you did not receive a tetanus shot today because you did not recall when your last one was given, check with your caregiver in the next day or two during follow up to determine if one is needed. °SEEK MEDICAL CARE IF:  °· The area around the wound has new or worsening redness or tenderness. °· Pus is coming from the wound °· There is a foul smell from the  wound or dressing °· The edges of a wound that had been repaired break open °SEEK IMMEDIATE MEDICAL CARE IF:  °· Red streaks are coming from the wound °· An unexplained oral temperature above 102° F (38.9° C) develops. °Document Released: 11/28/2000 Document Revised: 02/23/2012 Document Reviewed: 07/17/2008 °Exit[employer]® Patient Information ©2015 [employer]. This information is not intended to replace advice given to you by your health care provider. Make sure you discuss any questions you have with your health care provider. ° °

## 2014-07-13 NOTE — ED Notes (Signed)
Pt reports she stepped on glass last Saturday; right heel Went to PCP today and was told to f/u at [facility]??? Given Penicillin that she has not p/u Slow steady gait; last tetanus w/in last 2 yrs Alert w/no signs of acute distress.

## 2014-07-14 NOTE — ED Provider Notes (Signed)
Medical screening examination/treatment/procedure(s) were performed by resident physician or non-physician practitioner and as supervising physician I was immediately available for consultation/collaboration.   Pauline Good MD.   Billy Fischer, MD 07/14/14 [phone]

## 2014-08-07 ENCOUNTER — Encounter: Payer: Self-pay | Admitting: Cardiology

## 2014-08-07 ENCOUNTER — Other Ambulatory Visit: Payer: Self-pay | Admitting: *Deleted

## 2014-08-07 ENCOUNTER — Ambulatory Visit (INDEPENDENT_AMBULATORY_CARE_PROVIDER_SITE_OTHER): Payer: BC Managed Care – PPO | Admitting: Cardiology

## 2014-08-07 VITALS — BP 180/124 | HR 76 | Ht 70.0 in | Wt 212.0 lb

## 2014-08-07 DIAGNOSIS — I517 Cardiomegaly: Secondary | ICD-10-CM

## 2014-08-07 DIAGNOSIS — I10 Essential (primary) hypertension: Secondary | ICD-10-CM

## 2014-08-07 MED ORDER — VALSARTAN-HYDROCHLOROTHIAZIDE 160-12.5 MG PO TABS: 1.0000 | ORAL_TABLET | Freq: Two times a day (BID) | ORAL | Status: DC

## 2014-08-07 NOTE — Patient Instructions (Signed)
Start Diovan/HCT 160/12.5mg  two times a day.  Your physician recommends that you return for lab work in: 2 weeks--BMET. I have given you a prescription for this. Please fax the results to Dr Aundra Dubin [phone]  Your physician wants you to follow-up in: 1 year with Dr Aundra Dubin. (August 2016).  You will receive a reminder letter in the mail two months in advance. If you don't receive a letter, please call our office to schedule the follow-up appointment.

## 2014-08-08 NOTE — Progress Notes (Signed)
Patient ID: Leslie [name], female   DOB: 06-Apr-1976, 38 y.o.   MRN: [id number] PCP: Dr. Ronie Spies [facility])  38 yo with history of HTN for a number of years returns for followup.  Patient has HTN that had been reasonably controlled with Diovan but is now off this medication as she was trying to become pregnant.  At last appointment, I asked her to change from Toprol XL to labetalol bid.  She is taking both doses of labetalol in the evening as she "cannot take medications in the morning."  She is not taking triamterene/HCTZ.  BP is quite high today.    In 4/15, patient had surgery to remove uterine fibroids. Says "they pumped me full of IV fluid" around the time of the surgery, and she noted swelling. Shortly after this, she developed pain across her chest. She went to the ER and was given muscle relaxers, with resolution of the pain.  On CXR, however, cardiomegaly was described as well as pulmonary venous congestion. Currently, she notes that her exercise tolerance is not as robust as it was a few months ago.  She gets some dyspnea walking up stairs or walking longer distances outside.  Occasional fleeting atypical chest pain.  No orthopnea/PND.  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   Labs (5/15): BNP < 5, K 4, creatinine 0.69, LDL 148, HDL 45 Labs (7/15): BNP normal, K 4.4, creatinine 0.9, TSH normal  PMH: 1. HTN: x years: peripheral edema with amlodipine 2. Fibroid surgery 4/15  FH: Both parents with HTN, father with CAD and aortic valve replacement.   SH: Pharmacist, hospital at [employer], nonsmoker, married  ROS: All systems reviewed and negative except as per HPI.   Current Outpatient Prescriptions  Medication Sig Dispense Refill  . methocarbamol (ROBAXIN) 500 MG tablet Take 1 tablet (500 mg total) by mouth every 8 (eight) hours as  needed for muscle spasms.  60 tablet  0  . potassium chloride SA (K-DUR,KLOR-CON) 20 MEQ tablet Take 1 tablet (20 mEq total) by mouth daily.  30 tablet  1  . valsartan-hydrochlorothiazide (DIOVAN HCT) 160-12.5 MG per tablet Take 1 tablet by mouth 2 (two) times daily.  60 tablet  1   No current facility-administered medications for this visit.    BP 180/124  Pulse 76  Ht 5\' 10"  (1.778 m)  Wt 212 lb (96.163 kg)  BMI 30.42 kg/m2  LMP 07/03/2014 General: NAD Neck: No JVD, no thyromegaly or thyroid nodule.  Lungs: Clear to auscultation bilaterally with normal respiratory effort. CV: Nondisplaced PMI.  Heart regular S1/S2, no S3/S4, no murmur.  No peripheral edema.  No carotid bruit.  Normal pedal pulses.  Abdomen: Soft, nontender, no hepatosplenomegaly, no distention.  Skin: Intact without lesions or rashes.  Neurologic: Alert and oriented x 3.  Psych: Normal affect. Extremities: No clubbing or cyanosis.   Assessment/Plan:  1. HTN: Still uncontrolled.  She was unable to tolerate amlodipine in the past due to peripheral edema.  She has HTN at a young age.  Suspect this is familial as both parents apparently developed HTN relatively early on.  She says that she cannot take a medication more than once a day, she can only take medications in the evening, and she does not want medication delivered by a patch.  She needs to  stop labetalol as it is not adequate to just take this in the evening.  She told me today that she is not trying to get pregnant anymore and wants to go back on valsartan.  I talked to her at length about the pregnancy risk of this medication and she understands.   - Stop labetalol and start losartan/HCTZ 160/12.5 daily. Will need BMET at PCP's office in a couple of weeks.  2. Cardiomegaly/pulmonary venous congestion on CXR: She is not volume overloaded on exam. BNP has been normal on two different occasions.  It is possible the CXR abnormality may due to the fact it was a portable  CXR. Would also consider that she had recently had surgery and had been given a lot of IV fluid apparently, which could lead to the pulmonary venous congestion if not cleared fully.  Her exercise tolerance is not as good as several months ago, but I wonder if this is not because of some deconditioning since her fibroid surgery.  She has not been out exercising as much as she used to be.  I have not been able to get a copy of the echo report from the study done at her PCP's office.  Will try again.  If this is normal, no further workup needed.  I again encouraged her to start a regular exercise regimen, walking 5-6 days a week and building up to 30 minutes at a time.   Mrs Willits can followup as needed in this office.  Her PCP can adjust BP meds as needed.   Loralie Champagne 08/08/2014

## 2014-08-09 ENCOUNTER — Telehealth: Payer: Self-pay | Admitting: *Deleted

## 2014-08-09 ENCOUNTER — Telehealth: Payer: Self-pay | Admitting: Cardiology

## 2014-08-09 NOTE — Telephone Encounter (Signed)
New message     Pt says the diovan is making her have diarrhea.  Can she lower the dosage?

## 2014-08-09 NOTE — Telephone Encounter (Signed)
Pt took first dose of diovan last evening. She plans to only take this once daily even tho it is ordered BID  After taking it she has had cramping, and freq loose stools.   She wants to know: 1.) will this go away 2.) can she decrease the dose 3.) can she try something different  Will forward to Dr. Aundra Dubin to advise.

## 2014-08-09 NOTE — Telephone Encounter (Signed)
Patient called back. Informed what I left on her voicemail earlier from Dr. Aundra Dubin. Try to keep taking diovan daily.   See previous encounter.

## 2014-08-09 NOTE — Telephone Encounter (Signed)
Left detailed message on patient's voice mail. Instructed to call back if wants to discuss further or needs additional information.

## 2014-08-09 NOTE — Telephone Encounter (Signed)
She should be taking Diovan/HCT 160/12.5 once daily (not twice).  Would try to stay on it for a while before changing.  She was on this in the past with no problems.

## 2014-08-11 NOTE — Addendum Note (Signed)
Addended by: Katrine Coho on: 08/11/2014 07:31 AM   Modules accepted: Orders

## 2014-10-03 ENCOUNTER — Emergency Department (HOSPITAL_COMMUNITY)
Admission: EM | Admit: 2014-10-03 | Discharge: 2014-10-03 | Disposition: A | Discharge status: Home / Self Care | Payer: BC Managed Care – PPO | Source: Home / Self Care | Attending: Family Medicine | Admitting: Family Medicine

## 2014-10-03 ENCOUNTER — Encounter (HOSPITAL_COMMUNITY): Payer: Self-pay | Admitting: Emergency Medicine

## 2014-10-03 DIAGNOSIS — M7072 Other bursitis of hip, left hip: Secondary | ICD-10-CM

## 2014-10-03 MED ORDER — MELOXICAM 7.5 MG PO TABS: 7.5000 mg | ORAL_TABLET | Freq: Two times a day (BID) | ORAL | Status: DC
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
## 2014-10-03 NOTE — ED Notes (Signed)
l  eg   Leg  Pain  Over  Last  sev  Months      X  sev  Months   Worse  Over  Last  sev  Days            denys    Any   specefic  inj           Pain l   Hip  And  Upper  Thigh                Told  By  Dr  Eddie Dibbles        That  She  Has  Problems   With  Tendon  On  The  l  Hip  Pain  Worse  On          Standing

## 2014-10-16 ENCOUNTER — Encounter (HOSPITAL_COMMUNITY): Payer: Self-pay | Admitting: Emergency Medicine

## 2014-10-27 ENCOUNTER — Other Ambulatory Visit: Payer: Self-pay | Admitting: Cardiology

## 2014-11-06 ENCOUNTER — Telehealth: Payer: Self-pay | Admitting: Cardiology

## 2014-11-06 NOTE — Telephone Encounter (Signed)
New message    Patient calling saying she is pregnant having been bleeding.     Please advise on stopping  blood pressure medication .

## 2014-11-06 NOTE — Telephone Encounter (Signed)
Left message on machine for pt to contact the office.   

## 2014-11-07 NOTE — Telephone Encounter (Signed)
lmtcb

## 2014-11-07 NOTE — Telephone Encounter (Signed)
Lm to call back in response to Dr. Claris Gladden response.

## 2014-11-07 NOTE — Telephone Encounter (Signed)
Left pt a message to call back. 

## 2014-11-07 NOTE — Telephone Encounter (Signed)
Make sure she is not taking the valsartan-HCTZ.  She will need to monitor her BP.  If it is regularly > about 150/90, she will need to start labetalol 100 mg bid.  Would recommend she get a home cuff to check on this.  Call with BP readings in 2 wks.

## 2014-11-07 NOTE — Telephone Encounter (Signed)
Spoke with pt , she states that she is pregnant, and Dr. Aundra Dubin told her that the BP medication Diovan that she is taking can cause birth defects. Pt had stop the BP medication. She has been seen her GYN which is monitoring her BP she  has an appointment to see him/her tomorrow. Pt is aware that this messages will be send to Dr Aundra Dubin for reviewing/recommendatios.

## 2014-11-08 NOTE — Telephone Encounter (Signed)
lmtcb

## 2014-11-15 NOTE — Telephone Encounter (Signed)
Pt advised not to take KCL since she is not taking valsartan/HCT. She is going to call tomorrow with the metoprolol dose, she does not have it with her at this time and does not know the dose.

## 2014-11-15 NOTE — Telephone Encounter (Signed)
Pt advised, verbalized understanding, agreed with plan. Pt states she is not taking valsartan/HCT, she is only taking metoprolol at this time.

## 2014-11-15 NOTE — Telephone Encounter (Signed)
LMTCB

## 2014-11-15 NOTE — Telephone Encounter (Signed)
Follow up ° ° ° ° °Returning Leslie [name]'s call °

## 2014-11-15 NOTE — Telephone Encounter (Signed)
Follow up Follow up pt called back

## 2014-11-16 NOTE — Telephone Encounter (Signed)
Unable to reach pt by telephone to confirm metoprolol dose.

## 2014-11-21 ENCOUNTER — Telehealth: Payer: Self-pay | Admitting: Cardiology

## 2014-11-21 NOTE — Telephone Encounter (Signed)
See phone note 11/21/14

## 2014-11-21 NOTE — Telephone Encounter (Signed)
LMTCB to confirm metoprolol dose.

## 2014-11-21 NOTE — Telephone Encounter (Signed)
I spoke with patient and confirmed that she is taking metoprolol succinate 50mg  daily. Pt states she is not taking Diovan/HCT currently and was not taking Diovan/HCT at the time of office visit with Dr Aundra Dubin in August 2015. Pt states she has been checking her BP but did not have the readings with her.  Pt has an appt with her OB/GYN this Friday. I advised pt to follow up with OB/GYN about her BP and take a record of her BP readings with her.  Pt advised she could also follow up with PCP (Dr Claris Gladden August 2015 note indicates PCP can adjust BP medications) about her BP if necessary.

## 2014-11-21 NOTE — Telephone Encounter (Signed)
New Msg    Pt calling to inform of BP medication dosage which is 50 mg. Pt states her BP is going up but she doesn't have her readings avail. At this time. Pt can be reached at  [phone].

## 2015-06-02 ENCOUNTER — Ambulatory Visit (INDEPENDENT_AMBULATORY_CARE_PROVIDER_SITE_OTHER): Payer: BC Managed Care – PPO | Admitting: Physician Assistant

## 2015-06-02 VITALS — BP 146/100 | HR 89 | Temp 97.8°F | Resp 16 | Ht 70.5 in | Wt 211.0 lb

## 2015-06-02 DIAGNOSIS — Z1211 Encounter for screening for malignant neoplasm of colon: Secondary | ICD-10-CM

## 2015-06-02 NOTE — Progress Notes (Signed)
   Subjective:    Patient ID: Leslie [name], female    DOB: 06-Aug-1976, 39 y.o.   MRN: [id number]  Chief Complaint  Patient presents with  . Labs Only    Hemocult   Patient Active Problem List   Diagnosis Date Noted  . Cardiomegaly 05/03/2014  . Edema 04/26/2014  . Iron deficiency anemia 04/06/2014  . 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 Hypokalemia 01/28/2014  . Other abnormal glucose 11/09/2007  . HYPERTENSION 08/31/2007   Medications, allergies, past medical history, surgical history, family history, social history and problem list reviewed and updated.  HPI  70 yof presents wanting hemoccult card for insurance purposes.  [employer] form showing desire for hemoccult test for preventative maintenance. Pt does not have her pcp here but is thinking of transferring to [place].   Denies constipation, diarrhea, fevers, chills, abd pain.   BP elevated today. Pt states this is ongoing and is currently being treated for this.   Review of Systems No cp, sob.     Objective:   Physical Exam  Constitutional: She is oriented to person, place, and time. She appears well-developed and well-nourished.  Non-toxic appearance. She does not have a sickly appearance. She does not appear ill. No distress.  BP 146/100 mmHg  Pulse 89  Temp(Src) 97.8 F (36.6 C) (Oral)  Resp 16  Ht 5' 10.5" (1.791 m)  Wt 211 lb (95.709 kg)  BMI 29.84 kg/m2  SpO2 99%  LMP 05/21/2015 (Approximate)   Neurological: She is alert and oriented to person, place, and time.      Assessment & Plan:   47 yof presents wanting hemoccult card for insurance purposes.  Screen for colon cancer - Plan: POC Hemoccult Bld/Stl (3-Cd Home Screen) --hemoccult card given --will f/u on results  Julieta Gutting, PA-C Physician Assistant-Certified [facility]  06/02/2015 5:26 PM

## 2015-06-02 NOTE — Patient Instructions (Signed)
We have given you the hemoccult card today. Please bring back to [place] and we will process and get the results to you.

## 2015-06-07 ENCOUNTER — Telehealth: Payer: Self-pay | Admitting: Physician Assistant

## 2015-06-07 DIAGNOSIS — K921 Melena: Secondary | ICD-10-CM

## 2015-06-07 LAB — POC HEMOCCULT BLD/STL (HOME/3-CARD/SCREEN)
Card #3 Fecal Occult Blood, POC: POSITIVE
FECAL OCCULT BLD: NEGATIVE
Fecal Occult Blood, POC: NEGATIVE

## 2015-06-07 NOTE — Telephone Encounter (Signed)
Positive hemoccult, referred to GI.

## 2015-06-07 NOTE — Addendum Note (Signed)
Addended byWalden Field A on: 06/07/2015 04:17 PM   Modules accepted: Orders

## 2015-09-13 ENCOUNTER — Encounter: Payer: Self-pay | Admitting: *Deleted

## 2015-09-13 ENCOUNTER — Encounter: Payer: Self-pay | Admitting: Cardiology

## 2015-09-13 ENCOUNTER — Ambulatory Visit (INDEPENDENT_AMBULATORY_CARE_PROVIDER_SITE_OTHER): Payer: BC Managed Care – PPO | Admitting: Cardiology

## 2015-09-13 VITALS — BP 160/104 | HR 71 | Ht 70.0 in | Wt 214.0 lb

## 2015-09-13 DIAGNOSIS — R0602 Shortness of breath: Secondary | ICD-10-CM | POA: Diagnosis not present

## 2015-09-13 DIAGNOSIS — R0609 Other forms of dyspnea: Secondary | ICD-10-CM | POA: Diagnosis not present

## 2015-09-13 DIAGNOSIS — I10 Essential (primary) hypertension: Secondary | ICD-10-CM | POA: Diagnosis not present

## 2015-09-13 DIAGNOSIS — R0600 Dyspnea, unspecified: Secondary | ICD-10-CM | POA: Insufficient documentation

## 2015-09-13 DIAGNOSIS — R079 Chest pain, unspecified: Secondary | ICD-10-CM

## 2015-09-13 MED ORDER — METOPROLOL SUCCINATE ER 100 MG PO TB24: ORAL_TABLET | ORAL | Status: DC

## 2015-09-13 MED ORDER — VALSARTAN-HYDROCHLOROTHIAZIDE 160-12.5 MG PO TABS
1.0000 | ORAL_TABLET | Freq: Every day | ORAL | Status: DC
Start: 2015-09-13 — End: 2015-12-10

## 2015-09-13 NOTE — Progress Notes (Signed)
Patient ID: Leslie [name], female   DOB: October 29, 1976, 39 y.o.   MRN: [id number] PCP: Dr. Jeanie Cooks  39 yo with history of HTN for a number of years returns for followup.  BP is 160/104 today.  She is not taking Toprol XL; she ran out and did not get it refilled.  She is still taking valsartan/HCTZ.  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  She is short of breath walking up steps or up a hill.  She uses a treadmill occasionally for exercise.  She has rare atypical chest pain that is usually associated with stress.  No lightheadedness.  Labs (5/15): BNP < 5, K 4, creatinine 0.69, LDL 148, HDL 45 Labs (7/15): BNP normal, K 4.4, creatinine 0.9, TSH normal  ECG: NSR, normal  PMH: 1. HTN: x years: peripheral edema with amlodipine 2. Fibroid surgery 4/15 3. Sickle cell trait 4. Infertility  FH: Both parents with HTN, father with CAD and aortic valve replacement.   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   Current Outpatient Prescriptions  Medication Sig Dispense Refill  . methocarbamol (ROBAXIN) 500 MG tablet Take 1 tablet (500 mg total) by mouth every 8 (eight) hours as needed for muscle spasms. 60 tablet 0  . Prenat-FeFum-DSS-FA-DHA w/o A (PRENAISSANCE) 29-1.25-325 MG CAPS Take 1 capsule by mouth daily.  12  . valsartan-hydrochlorothiazide (DIOVAN-HCT) 160-12.5 MG tablet Take 1 tablet by mouth daily. 30 tablet 7  . meloxicam (MOBIC) 7.5 MG tablet Take 1 tablet (7.5 mg total) by mouth 2 (two) times daily after a meal. (Patient not taking: Reported on 09/13/2015) 60 tablet 1  . metoprolol succinate (TOPROL-XL) 100 MG 24 hr tablet Take with or immediately following a meal. 30 tablet 7   No current facility-administered medications for this visit.    BP 160/104 mmHg  Pulse 71  Ht 5\' 10"  (1.778 m)  Wt 214 lb (97.07 kg)  BMI 30.71 kg/m2 General: NAD Neck: No  JVD, no thyromegaly or thyroid nodule.  Lungs: Clear to auscultation bilaterally with normal respiratory effort. CV: Nondisplaced PMI.  Heart regular S1/S2, no S3/S4, no murmur.  No peripheral edema.  No carotid bruit.  Normal pedal pulses.  Abdomen: Soft, nontender, no hepatosplenomegaly, no distention.  Skin: Intact without lesions or rashes.  Neurologic: Alert and oriented x 3.  Psych: Normal affect. Extremities: No clubbing or cyanosis.   Assessment/Plan:  1. HTN: Still uncontrolled.  She was unable to tolerate amlodipine in the past due to peripheral edema.  She has HTN at a young age.  Suspect this is familial as both parents apparently developed HTN relatively early on.  She says that she cannot take a medication more than once a day, she can only take medications in the evening, and she does not want medication delivered by a patch. She stopped Toprol XL when she ran out and BP is quite high today.  She thinks that this BP is "ok" and questions why I want her to start back on Toprol XL.  She will not increase her valsartan/HCTZ dose as she thinks that it would then be "too strong."  She has infertility and is not trying to get pregnant.  She understands the risks of valsartan to pregnancy.  - I talked to her extensively about the risks of HTN => MI, CVA, CKD.  She says that she  is willing to restart Toprol XL 100 mg daily.   - BMET today.  - I encouraged efforts at exercise and weight loss.  2. Exertional dyspnea: I will get a BNP and also arrange for an echocardiogram.  She does not appear volume overloaded on exam. This is probably from deconditioning.   Followup 6 months as long as echo looks ok.   Loralie Champagne 09/13/2015

## 2015-09-13 NOTE — Patient Instructions (Signed)
Medication Instructions:  Refills have been sent in to your pharmacy for valsartan-HCT 160/12.5mg  daily and metoprolol succinate (Toprol XL) 100mg  daily.  Labwork: BMET/BNP today  Testing/Procedures: Your physician has requested that you have an echocardiogram. Echocardiography is a painless test that uses sound waves to create images of your heart. It provides your doctor with information about the size and shape of your heart and how well your heart's chambers and valves are working. This procedure takes approximately one hour. There are no restrictions for this procedure.    Follow-Up: Your physician wants you to follow-up in: 6 months with Dr Aundra Dubin. (March 2017).  You will receive a reminder letter in the mail two months in advance. If you don't receive a letter, please call our office to schedule the follow-up appointment.

## 2015-09-14 LAB — BRAIN NATRIURETIC PEPTIDE: BNP: 20.1 pg/mL (ref 0.0–100.0)

## 2015-09-14 LAB — BASIC METABOLIC PANEL
BUN / CREAT RATIO: 15 (ref 9–23)
BUN: 12 mg/dL (ref 6–24)
CO2: 22 mmol/L (ref 18–29)
Calcium: 9.2 mg/dL (ref 8.7–10.2)
Chloride: 101 mmol/L (ref 97–108)
Creatinine, Ser: 0.79 mg/dL (ref 0.57–1.00)
GFR calc Af Amer: 108 mL/min/{1.73_m2} (ref >59)
GFR calc non Af Amer: 94 mL/min/{1.73_m2} (ref >59)
Glucose: 121 mg/dL — ABNORMAL HIGH (ref 65–99)
POTASSIUM: 3.3 mmol/L — AB (ref 3.5–5.2)
Sodium: 141 mmol/L (ref 134–144)

## 2015-09-16 ENCOUNTER — Ambulatory Visit (INDEPENDENT_AMBULATORY_CARE_PROVIDER_SITE_OTHER): Payer: BC Managed Care – PPO | Admitting: Family Medicine

## 2015-09-16 VITALS — BP 172/120 | HR 83 | Temp 98.1°F | Resp 18 | Ht 70.0 in | Wt 211.0 lb

## 2015-09-16 DIAGNOSIS — M7989 Other specified soft tissue disorders: Secondary | ICD-10-CM

## 2015-09-16 DIAGNOSIS — I10 Essential (primary) hypertension: Secondary | ICD-10-CM | POA: Diagnosis not present

## 2015-09-16 LAB — POCT URINALYSIS DIP (MANUAL ENTRY)
BILIRUBIN UA: NEGATIVE
BILIRUBIN UA: NEGATIVE
Glucose, UA: NEGATIVE
Leukocytes, UA: NEGATIVE
Nitrite, UA: NEGATIVE
PROTEIN UA: NEGATIVE
SPEC GRAV UA: 1.02
Urobilinogen, UA: 0.2
pH, UA: 5

## 2015-09-16 MED ORDER — HYDROCHLOROTHIAZIDE 12.5 MG PO TABS: 12.5000 mg | ORAL_TABLET | Freq: Every day | ORAL | Status: DC
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
## 2015-09-16 NOTE — Patient Instructions (Addendum)
1. INCREASE KDUR TO ONE TABLET DAILY. 2.  ADD HYDROCHLORATHIAZIDE 12.5MG  ONE DAILY. 3.  CONTINUE VALSARTAN/HCTZ 160/12.5 ONE DAILY.   4. CONTINUE METOPROLOL 100MG  ONE DAILY. 5. RETURN Friday, October 05, 2015 AT 4:30PM AT [address].   Hypertension Hypertension, commonly called high blood pressure, is when the force of blood pumping through your arteries is too strong. Your arteries are the blood vessels that carry blood from your heart throughout your body. A blood pressure reading consists of a higher number over a lower number, such as 110/72. The higher number (systolic) is the pressure inside your arteries when your heart pumps. The lower number (diastolic) is the pressure inside your arteries when your heart relaxes. Ideally you want your blood pressure below 120/80. Hypertension forces your heart to work harder to pump blood. Your arteries may become narrow or stiff. Having hypertension puts you at risk for heart disease, stroke, and other problems.  RISK FACTORS Some risk factors for high blood pressure are controllable. Others are not.  Risk factors you cannot control include:   Race. You may be at higher risk if you are African American.  Age. Risk increases with age.  Gender. Men are at higher risk than women before age 76 years. After age 46, women are at higher risk than men. Risk factors you can control include:  Not getting enough exercise or physical activity.  Being overweight.  Getting too much fat, sugar, calories, or salt in your diet.  Drinking too much alcohol. SIGNS AND SYMPTOMS Hypertension does not usually cause signs or symptoms. Extremely high blood pressure (hypertensive crisis) may cause headache, anxiety, shortness of breath, and nosebleed. DIAGNOSIS  To check if you have hypertension, your health care provider will measure your blood pressure while you are seated, with your arm held at the level of your heart. It should be measured at least twice  using the same arm. Certain conditions can cause a difference in blood pressure between your right and left arms. A blood pressure reading that is higher than normal on one occasion does not mean that you need treatment. If one blood pressure reading is high, ask your health care provider about having it checked again. TREATMENT  Treating high blood pressure includes making lifestyle changes and possibly taking medicine. Living a healthy lifestyle can help lower high blood pressure. You may need to change some of your habits. Lifestyle changes may include:  Following the DASH diet. This diet is high in fruits, vegetables, and whole grains. It is low in salt, red meat, and added sugars.  Getting at least 2 hours of brisk physical activity every week.  Losing weight if necessary.  Not smoking.  Limiting alcoholic beverages.  Learning ways to reduce stress. If lifestyle changes are not enough to get your blood pressure under control, your health care provider may prescribe medicine. You may need to take more than one. Work closely with your health care provider to understand the risks and benefits. HOME CARE INSTRUCTIONS  Have your blood pressure rechecked as directed by your health care provider.   Take medicines only as directed by your health care provider. Follow the directions carefully. Blood pressure medicines must be taken as prescribed. The medicine does not work as well when you skip doses. Skipping doses also puts you at risk for problems.   Do not smoke.   Monitor your blood pressure at home as directed by your health care provider. SEEK MEDICAL CARE IF:   You think  you are having a reaction to medicines taken.  You have recurrent headaches or feel dizzy.  You have swelling in your ankles.  You have trouble with your vision. SEEK IMMEDIATE MEDICAL CARE IF:  You develop a severe headache or confusion.  You have unusual weakness, numbness, or feel faint.  You  have severe chest or abdominal pain.  You vomit repeatedly.  You have trouble breathing. MAKE SURE YOU:   Understand these instructions.  Will watch your condition.  Will get help right away if you are not doing well or get worse. Document Released: 12/01/2005 Document Revised: 04/17/2014 Document Reviewed: 09/23/2013 [facility] Patient Information 2015 [place], [place]. This information is not intended to replace advice given to you by your health care provider. Make sure you discuss any questions you have with your health care provider.

## 2015-09-17 ENCOUNTER — Telehealth: Payer: Self-pay | Admitting: Cardiology

## 2015-09-17 DIAGNOSIS — E876 Hypokalemia: Secondary | ICD-10-CM

## 2015-09-17 NOTE — Telephone Encounter (Signed)
New problem ° ° °Pt returning a call from nurse. Please call pt. °

## 2015-09-17 NOTE — Telephone Encounter (Signed)
Spoke with patient about lab results done last week. 

## 2015-09-21 ENCOUNTER — Other Ambulatory Visit: Payer: Self-pay

## 2015-09-21 ENCOUNTER — Ambulatory Visit (HOSPITAL_COMMUNITY): Payer: BC Managed Care – PPO | Attending: Cardiology

## 2015-09-21 ENCOUNTER — Encounter: Payer: Self-pay | Admitting: *Deleted

## 2015-09-21 ENCOUNTER — Telehealth: Payer: Self-pay | Admitting: Cardiology

## 2015-09-21 DIAGNOSIS — I10 Essential (primary) hypertension: Secondary | ICD-10-CM | POA: Diagnosis not present

## 2015-09-21 DIAGNOSIS — I340 Nonrheumatic mitral (valve) insufficiency: Secondary | ICD-10-CM | POA: Diagnosis not present

## 2015-09-21 DIAGNOSIS — R0600 Dyspnea, unspecified: Secondary | ICD-10-CM | POA: Diagnosis present

## 2015-09-21 DIAGNOSIS — R079 Chest pain, unspecified: Secondary | ICD-10-CM

## 2015-09-21 DIAGNOSIS — Z8249 Family history of ischemic heart disease and other diseases of the circulatory system: Secondary | ICD-10-CM | POA: Insufficient documentation

## 2015-09-21 DIAGNOSIS — R0602 Shortness of breath: Secondary | ICD-10-CM | POA: Diagnosis not present

## 2015-09-21 DIAGNOSIS — I517 Cardiomegaly: Secondary | ICD-10-CM | POA: Diagnosis not present

## 2015-09-21 NOTE — Telephone Encounter (Signed)
Note done, returned to medical records to be faxed.

## 2015-09-21 NOTE — Telephone Encounter (Signed)
Does she want a note stating she had an echo appt today at 7AM?

## 2015-09-21 NOTE — Telephone Encounter (Signed)
New Message  Pt called states that she forgot to pick up her office note for Work. Request to have that completed and ready for pick up this afternoon. Pt had an ECHO completed today.

## 2015-09-21 NOTE — Telephone Encounter (Signed)
Follow up      Pt need a note stating she was seen today faxed to her job.  Please fax it to  [phone]

## 2015-09-24 ENCOUNTER — Telehealth: Payer: Self-pay | Admitting: Cardiology

## 2015-09-24 NOTE — Telephone Encounter (Signed)
VM left for patient need phone number for her job so I can verify Fax number before note is faxed over.

## 2015-09-25 ENCOUNTER — Telehealth: Payer: Self-pay | Admitting: Cardiology

## 2015-09-25 NOTE — Telephone Encounter (Signed)
Called patients Job verified fax. Note faxed to [facility] at [phone].

## 2015-09-25 NOTE — Telephone Encounter (Signed)
F/u  Pt leaving her jobs phone number-- [phone]

## 2015-09-28 ENCOUNTER — Other Ambulatory Visit: Payer: BC Managed Care – PPO

## 2015-10-02 ENCOUNTER — Other Ambulatory Visit: Payer: BC Managed Care – PPO

## 2015-10-11 ENCOUNTER — Telehealth: Payer: Self-pay | Admitting: Family Medicine

## 2015-10-11 NOTE — Telephone Encounter (Signed)
-----   Message from Wardell Honour, MD sent at 10/09/2015  6:21 PM EDT ----- Please call patient; overdue for follow-up on high blood pressure; schedule OV with me on a Friday late afternoon.

## 2015-10-11 NOTE — Telephone Encounter (Signed)
LMOM to CB regarding making appt.

## 2015-10-26 ENCOUNTER — Telehealth: Payer: Self-pay | Admitting: Cardiology

## 2015-10-26 NOTE — Telephone Encounter (Signed)
Walk in pt form- Patient asking for Echo results-gave to [place]

## 2015-11-02 ENCOUNTER — Ambulatory Visit (INDEPENDENT_AMBULATORY_CARE_PROVIDER_SITE_OTHER): Payer: BC Managed Care – PPO | Admitting: Emergency Medicine

## 2015-11-02 ENCOUNTER — Ambulatory Visit (INDEPENDENT_AMBULATORY_CARE_PROVIDER_SITE_OTHER): Payer: BC Managed Care – PPO

## 2015-11-02 VITALS — BP 140/102 | HR 90 | Temp 98.4°F | Resp 18 | Ht 70.0 in | Wt 206.0 lb

## 2015-11-02 DIAGNOSIS — R918 Other nonspecific abnormal finding of lung field: Secondary | ICD-10-CM

## 2015-11-02 DIAGNOSIS — R079 Chest pain, unspecified: Secondary | ICD-10-CM

## 2015-11-02 DIAGNOSIS — F419 Anxiety disorder, unspecified: Secondary | ICD-10-CM

## 2015-11-02 DIAGNOSIS — I10 Essential (primary) hypertension: Secondary | ICD-10-CM

## 2015-11-02 MED ORDER — LORAZEPAM 1 MG PO TABS: 1.0000 mg | ORAL_TABLET | Freq: Three times a day (TID) | ORAL | Status: DC | PRN

## 2015-11-02 MED ORDER — PAROXETINE HCL 20 MG PO TABS: 20.0000 mg | ORAL_TABLET | Freq: Every day | ORAL | Status: DC

## 2015-11-02 NOTE — Progress Notes (Addendum)
Subjective:  Patient ID: Leslie [name], female    DOB: 1976-11-20  Age: 39 y.o. MRN: [id number]  CC: Chest Pain   HPI Leslie [name] presents  with chest pain. She was seen in the emergency room and had an EKG done which was nondiagnostic. And is coming here today very anxious and complaining of persistent chest pain is not radiating it's associated with shortness of breath and sensation of tingling around her mouth. She has no cough coryza wheezing fever chills. She is nonsmoker has a history of hypertension and was noted to have a blood pressure over [id number] systolic earlier in the day today. She is extremely stressed in her job she's not sleeping well she's eating and not abusing alcohol or drugs. She apparently was stressed to the point at work today that she submitted resignation effective 30 days from now. She is being seen by the cardiologist that the heart failure clinic for cardiomegaly and she is apparently improved from that standpoint her she has peripheral edema is improving she's not short of breath or orthopneic or has no PND  History Leslie [name] has a past medical history of Hypertension; Rotator cuff tear (11-07); Substance abuse; Pregnancy induced hypertension; History of sickle cell trait; Heart disease; Migraine (2006); and Anemia.   She has past surgical history that includes Knee arthroscopy (1999, 2000); Shoulder arthroscopy (2008); Knee arthroscopy (1999, 2000); and Dilatation & currettage/hysteroscopy with resectoscope (N/A, 04/06/2014).   Her  family history includes Cancer (age of onset: 32) in her mother; Cerebral palsy in her sister; Diabetes in her maternal grandmother; Heart disease in her father; Hyperlipidemia in her father; Hypertension in her brother, father, and mother; Kidney disease in her father.  She   reports that she has never smoked. She has never used smokeless tobacco. She reports that she does not drink alcohol or use illicit drugs.  Outpatient  Prescriptions Prior to Visit  Medication Sig Dispense Refill  . IRON PO Take by mouth.    . metoprolol succinate (TOPROL-XL) 100 MG 24 hr tablet Take with or immediately following a meal. 30 tablet 7  . POTASSIUM PO Take by mouth.    . Prenat-FeFum-DSS-FA-DHA w/o A (PRENAISSANCE) 29-1.25-325 MG CAPS Take 1 capsule by mouth daily.  12  . valsartan-hydrochlorothiazide (DIOVAN-HCT) 160-12.5 MG tablet Take 1 tablet by mouth daily. 30 tablet 7  . hydrochlorothiazide (HYDRODIURIL) 12.5 MG tablet Take 1 tablet (12.5 mg total) by mouth daily. (Patient not taking: Reported on 11/02/2015) 90 tablet 3   No facility-administered medications prior to visit.    Social History   Social History  . Marital Status: Married    Spouse Name: N/A  . Number of Children: 0  . Years of Education: 18   Occupational History  . teacher    Social History Main Topics  . Smoking status: Never Smoker   . Smokeless tobacco: Never Used  . Alcohol Use: No  . Drug Use: No  . Sexual Activity:    Partners: Male    Birth Control/ Protection: None   Other Topics Concern  . None   Social History Narrative   [employer] - Equities trader; [employer] planner.  Married - '11. No children. Spends weekdays with her grandfather who requires some attendance, weekends with her husband in their home. 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      Review of Systems  Constitutional: Negative for fever, chills and appetite change.  HENT: Negative for congestion, ear pain, postnasal drip, sinus  pressure and sore throat.   Eyes: Negative for pain and redness.  Respiratory: Positive for chest tightness and shortness of breath. Negative for cough and wheezing.   Cardiovascular: Negative for leg swelling.  Gastrointestinal: Negative for nausea, vomiting, abdominal pain, diarrhea, constipation and blood in stool.  Endocrine: Negative for polyuria.  Genitourinary: Negative for dysuria, urgency, frequency and flank pain.    Musculoskeletal: Negative for gait problem.  Skin: Negative for rash.  Neurological: Negative for weakness and headaches.  Psychiatric/Behavioral: Positive for sleep disturbance. Negative for confusion and decreased concentration. The patient is nervous/anxious.     Objective:  BP 140/102 mmHg  Pulse 90  Temp(Src) 98.4 F (36.9 C) (Oral)  Resp 18  Ht 5\' 10"  (1.778 m)  Wt 206 lb (93.441 kg)  BMI 29.56 kg/m2  SpO2 98%  LMP 10/10/2015  Physical Exam  Constitutional: She is oriented to person, place, and time. She appears well-developed and well-nourished. No distress.  HENT:  Head: Normocephalic and atraumatic.  Right Ear: External ear normal.  Left Ear: External ear normal.  Nose: Nose normal.  Eyes: Conjunctivae and EOM are normal. Pupils are equal, round, and reactive to light. No scleral icterus.  Neck: Normal range of motion. Neck supple. No tracheal deviation present.  Cardiovascular: Normal rate, regular rhythm and normal heart sounds.   Pulmonary/Chest: Effort normal. No respiratory distress. She has no wheezes. She has no rales.  Abdominal: She exhibits no mass. There is no tenderness. There is no rebound and no guarding.  Musculoskeletal: She exhibits no edema.  Lymphadenopathy:    She has no cervical adenopathy.  Neurological: She is alert and oriented to person, place, and time. Coordination normal.  Skin: Skin is warm and dry. No rash noted.  Psychiatric: Her behavior is normal. Judgment and thought content normal. Her mood appears anxious. Cognition and memory are normal.  tearful      Assessment & Plan:   Leslie [name] was seen today for chest pain.  Diagnoses and all orders for this visit:  Chest pain, unspecified chest pain type -     EKG 12-Lead -     DG Chest 2 View  Essential hypertension, benign -     EKG 12-Lead  Anxiety  Other orders -     LORazepam (ATIVAN) 1 MG tablet; Take 1 tablet (1 mg total) by mouth every 8 (eight) hours as needed for  anxiety. -     PARoxetine (PAXIL) 20 MG tablet; Take 1 tablet (20 mg total) by mouth daily.   I am having Leslie [name] start on LORazepam and PARoxetine. I am also having her maintain her PRENAISSANCE, metoprolol succinate, valsartan-hydrochlorothiazide, POTASSIUM PO, IRON PO, hydrochlorothiazide, and doxycycline.  Meds ordered this encounter  Medications  . doxycycline (ADOXA) 100 MG tablet    Sig: Take 100 mg by mouth 2 (two) times daily.  Marland Kitchen LORazepam (ATIVAN) 1 MG tablet    Sig: Take 1 tablet (1 mg total) by mouth every 8 (eight) hours as needed for anxiety.    Dispense:  90 tablet    Refill:  0  . PARoxetine (PAXIL) 20 MG tablet    Sig: Take 1 tablet (20 mg total) by mouth daily.    Dispense:  30 tablet    Refill:  5    Appropriate red flag conditions were discussed with the patient as well as actions that should be taken.  Patient expressed his understanding.  Follow-up: Return if symptoms worsen or fail to improve.  Roselee Culver,  MD    [facility] reading (PRIMARY) by  Dr. Ouida Sills unremarkable chest.

## 2015-11-02 NOTE — Patient Instructions (Signed)
Generalized Anxiety Disorder Generalized anxiety disorder (GAD) is a mental disorder. It interferes with life functions, including relationships, work, and school. GAD is different from normal anxiety, which everyone experiences at some point in their lives in response to specific life events and activities. Normal anxiety actually helps us prepare for and get through these life events and activities. Normal anxiety goes away after the event or activity is over.  GAD causes anxiety that is not necessarily related to specific events or activities. It also causes excess anxiety in proportion to specific events or activities. The anxiety associated with GAD is also difficult to control. GAD can vary from mild to severe. People with severe GAD can have intense waves of anxiety with physical symptoms (panic attacks).  SYMPTOMS The anxiety and worry associated with GAD are difficult to control. This anxiety and worry are related to many life events and activities and also occur more days than not for 6 months or longer. People with GAD also have three or more of the following symptoms (one or more in children):  Restlessness.   Fatigue.  Difficulty concentrating.   Irritability.  Muscle tension.  Difficulty sleeping or unsatisfying sleep. DIAGNOSIS GAD is diagnosed through an assessment by your health care provider. Your health care provider will ask you questions aboutyour mood,physical symptoms, and events in your life. Your health care provider may ask you about your medical history and use of alcohol or drugs, including prescription medicines. Your health care provider may also do a physical exam and blood tests. Certain medical conditions and the use of certain substances can cause symptoms similar to those associated with GAD. Your health care provider may refer you to a mental health specialist for further evaluation. TREATMENT The following therapies are usually used to treat GAD:    Medication. Antidepressant medication usually is prescribed for long-term daily control. Antianxiety medicines may be added in severe cases, especially when panic attacks occur.   Talk therapy (psychotherapy). Certain types of talk therapy can be helpful in treating GAD by providing support, education, and guidance. A form of talk therapy called cognitive behavioral therapy can teach you healthy ways to think about and react to daily life events and activities.  Stress managementtechniques. These include yoga, meditation, and exercise and can be very helpful when they are practiced regularly. A mental health specialist can help determine which treatment is best for you. Some people see improvement with one therapy. However, other people require a combination of therapies.   This information is not intended to replace advice given to you by your health care provider. Make sure you discuss any questions you have with your health care provider.   Document Released: 03/28/2013 Document Revised: 12/22/2014 Document Reviewed: 03/28/2013 Elsevier Interactive Patient Education 2016 Elsevier Inc.  

## 2015-11-05 ENCOUNTER — Telehealth: Payer: Self-pay | Admitting: Cardiology

## 2015-11-05 NOTE — Telephone Encounter (Signed)
New message      Pt went to urgent care on Friday for chest pain.  She is not having them now, but want to talk to the nurse about her chest pain

## 2015-11-05 NOTE — Telephone Encounter (Signed)
Left pt a message to call back. 

## 2015-11-05 NOTE — Addendum Note (Signed)
Addended by: Roselee Culver on: 11/05/2015 04:53 PM   Modules accepted: Orders

## 2015-11-07 NOTE — Telephone Encounter (Signed)
Lm to call back regarding her CP

## 2015-11-12 NOTE — Telephone Encounter (Signed)
LMTCB

## 2015-11-13 NOTE — Telephone Encounter (Signed)
Follow up   Pt states RN called her 11/28 and she is returning call

## 2015-11-13 NOTE — Telephone Encounter (Signed)
Spoke with pt, she states she went to urgent care due to chest pain. Pt was seen there by Dr. Ouida Sills, while in [facility]   an EKG, Cx ray were done also  PCP  order some antidepressant. Pt states Dr. Ouida Sills said that the chest pain was the result of stress. Pt has been out of town because her mother in [place]. Pt states she doing fine now, but someone called that she need a CT scan, because the chest xray show a spot that needs to be explore in detail. Pt is aware that she needs to call her PCP Dr. Ouida Sills about that. Pt is aware to call this office if needed.

## 2015-11-14 ENCOUNTER — Telehealth: Payer: Self-pay

## 2015-11-14 NOTE — Telephone Encounter (Signed)
Pt called to check the status of CT referral// informed [employer] denial for prior approval.  [phone]

## 2015-11-17 ENCOUNTER — Ambulatory Visit (INDEPENDENT_AMBULATORY_CARE_PROVIDER_SITE_OTHER): Payer: BC Managed Care – PPO | Admitting: Emergency Medicine

## 2015-11-17 VITALS — BP 158/108 | HR 70 | Temp 98.4°F | Resp 18 | Ht 69.0 in | Wt 208.4 lb

## 2015-11-17 DIAGNOSIS — I10 Essential (primary) hypertension: Secondary | ICD-10-CM

## 2015-11-17 DIAGNOSIS — J209 Acute bronchitis, unspecified: Secondary | ICD-10-CM | POA: Diagnosis not present

## 2015-11-17 MED ORDER — HYDROCOD POLST-CPM POLST ER 10-8 MG/5ML PO SUER: 5.0000 mL | Freq: Two times a day (BID) | ORAL | Status: DC

## 2015-11-17 MED ORDER — AZITHROMYCIN 250 MG PO TABS: ORAL_TABLET | ORAL | Status: DC

## 2015-11-17 NOTE — Patient Instructions (Signed)

## 2015-11-17 NOTE — Progress Notes (Signed)
Subjective:  Patient ID: Leslie [name], female    DOB: Jul 29, 1976  Age: 39 y.o. MRN: [id number]  CC: Cough and Shortness of Breath   HPI Leslie [name] presents  With a cough. She's had no improvement with over-the-counter medication. She has no congestion and discharge. She has no postnasal drainage. Has no wheezing or shortness of breath. She has no fever or chills. No nausea vomiting. No stool change. No rash. She's had no improvement with over-the-counter medication  History [name] has a past medical history of Hypertension; Rotator cuff tear (11-07); Substance abuse; Pregnancy induced hypertension; History of sickle cell trait; Heart disease; Migraine (2006); and Anemia.   She has past surgical history that includes Knee arthroscopy (1999, 2000); Shoulder arthroscopy (2008); Knee arthroscopy (1999, 2000); and Dilatation & currettage/hysteroscopy with resectoscope (N/A, 04/06/2014).   Her  family history includes Cancer (age of onset: 39) in her mother; Cerebral palsy in her sister; Diabetes in her maternal grandmother; Heart disease in her father; Hyperlipidemia in her father; Hypertension in her brother, father, and mother; Kidney disease in her father.  She   reports that she has never smoked. She has never used smokeless tobacco. She reports that she does not drink alcohol or use illicit drugs.  Outpatient Prescriptions Prior to Visit  Medication Sig Dispense Refill  . IRON PO Take by mouth.    Marland Kitchen LORazepam (ATIVAN) 1 MG tablet Take 1 tablet (1 mg total) by mouth every 8 (eight) hours as needed for anxiety. 90 tablet 0  . metoprolol succinate (TOPROL-XL) 100 MG 24 hr tablet Take with or immediately following a meal. 30 tablet 7  . PARoxetine (PAXIL) 20 MG tablet Take 1 tablet (20 mg total) by mouth daily. 30 tablet 5  . POTASSIUM PO Take by mouth.    . Prenat-FeFum-DSS-FA-DHA w/o A (PRENAISSANCE) 29-1.25-325 MG CAPS Take 1 capsule by mouth daily.  12  .  valsartan-hydrochlorothiazide (DIOVAN-HCT) 160-12.5 MG tablet Take 1 tablet by mouth daily. 30 tablet 7  . hydrochlorothiazide (HYDRODIURIL) 12.5 MG tablet Take 1 tablet (12.5 mg total) by mouth daily. (Patient not taking: Reported on 11/02/2015) 90 tablet 3  . doxycycline (ADOXA) 100 MG tablet Take 100 mg by mouth 2 (two) times daily.     No facility-administered medications prior to visit.    Social History   Social History  . Marital Status: Married    Spouse Name: N/A  . Number of Children: 0  . Years of Education: 18   Occupational History  . teacher    Social History Main Topics  . Smoking status: Never Smoker   . Smokeless tobacco: Never Used  . Alcohol Use: No  . Drug Use: No  . Sexual Activity:    Partners: Male    Birth Control/ Protection: None   Other Topics Concern  . None   Social History Narrative   [employer] - Equities trader; [employer] planner.  Married - '11. No children. Spends weekdays with her grandfather who requires some attendance, weekends with her husband in their home. 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      Review of Systems  Constitutional: Negative for fever, chills and appetite change.  HENT: Negative for congestion, ear pain, postnasal drip, sinus pressure and sore throat.   Eyes: Negative for pain and redness.  Respiratory: Positive for cough. Negative for shortness of breath and wheezing.   Cardiovascular: Negative for leg swelling.  Gastrointestinal: Negative for nausea, vomiting, abdominal pain, diarrhea, constipation and  blood in stool.  Endocrine: Negative for polyuria.  Genitourinary: Negative for dysuria, urgency, frequency and flank pain.  Musculoskeletal: Negative for gait problem.  Skin: Negative for rash.  Neurological: Negative for weakness and headaches.  Psychiatric/Behavioral: Negative for confusion and decreased concentration. The patient is not nervous/anxious.     Objective:  BP 158/108 mmHg  Pulse 70   Temp(Src) 98.4 F (36.9 C) (Oral)  Resp 18  Ht 5\' 9"  (1.753 m)  Wt 208 lb 6.4 oz (94.53 kg)  BMI 30.76 kg/m2  SpO2 98%  LMP 11/06/2015  Physical Exam  Constitutional: She is oriented to person, place, and time. She appears well-developed and well-nourished. No distress.  HENT:  Head: Normocephalic and atraumatic.  Right Ear: External ear normal.  Left Ear: External ear normal.  Nose: Nose normal.  Eyes: Conjunctivae and EOM are normal. Pupils are equal, round, and reactive to light. No scleral icterus.  Neck: Normal range of motion. Neck supple. No tracheal deviation present.  Cardiovascular: Normal rate, regular rhythm and normal heart sounds.   Pulmonary/Chest: Effort normal. No respiratory distress. She has no wheezes. She has no rales.  Abdominal: She exhibits no mass. There is no tenderness. There is no rebound and no guarding.  Musculoskeletal: She exhibits no edema.  Lymphadenopathy:    She has no cervical adenopathy.  Neurological: She is alert and oriented to person, place, and time. Coordination normal.  Skin: Skin is warm and dry. No rash noted.  Psychiatric: She has a normal mood and affect. Her behavior is normal.      Assessment & Plan:   Gindy was seen today for cough and shortness of breath.  Diagnoses and all orders for this visit:  Acute bronchitis, unspecified organism  Essential hypertension  Other orders -     chlorpheniramine-HYDROcodone (TUSSIONEX PENNKINETIC ER) 10-8 MG/5ML SUER; Take 5 mLs by mouth 2 (two) times daily. -     azithromycin (ZITHROMAX) 250 MG tablet; Take 2 tabs PO x 1 dose, then 1 tab PO QD x 4 days   I have discontinued Ms. Sturgill doxycycline. I am also having her start on chlorpheniramine-HYDROcodone and azithromycin. Additionally, I am having her maintain her PRENAISSANCE, metoprolol succinate, valsartan-hydrochlorothiazide, POTASSIUM PO, IRON PO, hydrochlorothiazide, LORazepam, and PARoxetine.  Meds ordered this encounter    Medications  . chlorpheniramine-HYDROcodone (TUSSIONEX PENNKINETIC ER) 10-8 MG/5ML SUER    Sig: Take 5 mLs by mouth 2 (two) times daily.    Dispense:  60 mL    Refill:  0  . azithromycin (ZITHROMAX) 250 MG tablet    Sig: Take 2 tabs PO x 1 dose, then 1 tab PO QD x 4 days    Dispense:  6 tablet    Refill:  0    Appropriate red flag conditions were discussed with the patient as well as actions that should be taken.  Patient expressed his understanding.  Follow-up: Return if symptoms worsen or fail to improve.  Roselee Culver, MD

## 2015-11-22 ENCOUNTER — Ambulatory Visit (INDEPENDENT_AMBULATORY_CARE_PROVIDER_SITE_OTHER): Payer: BC Managed Care – PPO | Admitting: Family Medicine

## 2015-11-22 VITALS — BP 138/96 | HR 84 | Temp 98.4°F | Resp 16 | Ht 69.5 in | Wt 210.0 lb

## 2015-11-22 DIAGNOSIS — R490 Dysphonia: Secondary | ICD-10-CM

## 2015-11-22 DIAGNOSIS — R05 Cough: Secondary | ICD-10-CM | POA: Diagnosis not present

## 2015-11-22 DIAGNOSIS — R059 Cough, unspecified: Secondary | ICD-10-CM

## 2015-11-22 MED ORDER — HYDROCOD POLST-CPM POLST ER 10-8 MG/5ML PO SUER: 5.0000 mL | Freq: Two times a day (BID) | ORAL | Status: DC | PRN

## 2015-11-22 MED ORDER — PREDNISONE 20 MG PO TABS: ORAL_TABLET | ORAL | Status: DC

## 2015-11-22 NOTE — Progress Notes (Signed)
   Subjective:    Patient ID: Leslie [name], female    DOB: 21-Apr-1976, 39 y.o.   MRN: [id number] By signing my name below, I, Zola Button, attest that this documentation has been prepared under the direction and in the presence of Robyn Haber, MD.  Electronically Signed: Zola Button, Medical Scribe. 11/22/2015. 7:15 PM.  HPI HPI Comments: Leslie [name] is a 39 y.o. female with a history of hypertension who presents to the [facility] for a follow-up for cough. Patient saw Dr. Ouida Sills 5 days ago for cough and SOB, and she was diagnosed with bronchitis at that time. She was given antibiotics and Tussionex, which she has been taking with minimal relief. She still has a primarily dry cough and also has voice hoarseness. She has had the cough for about a month now. She takes Diovan-HCT and metoprolol for hypertension. She states she has been on Diovan-HCT for a long time. Patient notes that Dr. Ouida Sills seemed hesitant to take her off of blood pressure medications. She is busy working two jobs and believes she has not been able to rest enough.  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  Review of Systems  HENT: Positive for voice change.   Respiratory: Positive for cough.        Objective:   Physical Exam CONSTITUTIONAL: Well developed/well nourished HEAD: Normocephalic/atraumatic EYES: EOM/PERRL ENMT: Mucous membranes moist, normal TM's, hoarse voice NECK: supple no meningeal signs SPINE: entire spine nontender CV: S1/S2 noted, no murmurs/rubs/gallops noted LUNGS: Lungs are clear to auscultation bilaterally, no apparent distress ABDOMEN: soft, nontender, no rebound or guarding GU: no cva tenderness NEURO: Pt is awake/alert, moves all extremitiesx4 EXTREMITIES: pulses normal, full ROM SKIN: warm, color normal PSYCH: no abnormalities of mood noted      Assessment & Plan:   This chart was scribed in my presence and reviewed  by me personally.    ICD-9-CM ICD-10-CM   1. Hoarseness 784.42 R49.0 predniSONE (DELTASONE) 20 MG tablet     chlorpheniramine-HYDROcodone (TUSSIONEX PENNKINETIC ER) 10-8 MG/5ML SUER  2. Cough 786.2 R05 predniSONE (DELTASONE) 20 MG tablet     chlorpheniramine-HYDROcodone (TUSSIONEX PENNKINETIC ER) 10-8 MG/5ML SUER     Signed, Robyn Haber, MD

## 2015-11-22 NOTE — Patient Instructions (Signed)
Even though you been on Diovan for long time, it sometimes causes cough after prolonged use. If you're not getting better with today's change in strategy, please return and we'll discuss other blood pressure medicines

## 2015-12-10 ENCOUNTER — Ambulatory Visit (INDEPENDENT_AMBULATORY_CARE_PROVIDER_SITE_OTHER): Payer: BC Managed Care – PPO | Admitting: Emergency Medicine

## 2015-12-10 VITALS — BP 142/92 | HR 85 | Temp 98.3°F | Resp 16 | Ht 69.5 in | Wt 212.8 lb

## 2015-12-10 DIAGNOSIS — R079 Chest pain, unspecified: Secondary | ICD-10-CM

## 2015-12-10 DIAGNOSIS — J209 Acute bronchitis, unspecified: Secondary | ICD-10-CM

## 2015-12-10 DIAGNOSIS — I10 Essential (primary) hypertension: Secondary | ICD-10-CM

## 2015-12-10 DIAGNOSIS — J40 Bronchitis, not specified as acute or chronic: Secondary | ICD-10-CM

## 2015-12-10 MED ORDER — VALSARTAN-HYDROCHLOROTHIAZIDE 160-12.5 MG PO TABS: 1.0000 | ORAL_TABLET | Freq: Every day | ORAL | Status: DC

## 2015-12-10 MED ORDER — HYDROCOD POLST-CPM POLST ER 10-8 MG/5ML PO SUER: 5.0000 mL | Freq: Two times a day (BID) | ORAL | Status: DC

## 2015-12-10 MED ORDER — LEVOFLOXACIN 500 MG PO TABS: 500.0000 mg | ORAL_TABLET | Freq: Every day | ORAL | Status: AC

## 2015-12-10 MED ORDER — PSEUDOEPHEDRINE-GUAIFENESIN ER 60-600 MG PO TB12: 1.0000 | ORAL_TABLET | Freq: Two times a day (BID) | ORAL | Status: DC

## 2015-12-10 NOTE — Patient Instructions (Signed)

## 2015-12-10 NOTE — Progress Notes (Signed)
Subjective:  Patient ID: Leslie [name], female    DOB: Dec 07, 1976  Age: 39 y.o. MRN: [id number]  CC: Follow-up   HPI Leslie [name] presents   Patient was seen recently with upper respiratory infection and reated discharge she is got recurrent symptoms of nasal congestion postnasal drainage and nasal discharge with a purulent appearing drainage. She has had chills but no fever. She has a cough productive of purulent sputum. With no wheezing or shortness of breath. No nausea vomiting was noted. Said no improvement with over-the-counter medication.  History Leslie [name] has a past medical history of Hypertension; Rotator cuff tear (11-07); Substance abuse; Pregnancy induced hypertension; History of sickle cell trait; Heart disease; Migraine (2006); and Anemia.   She has past surgical history that includes Knee arthroscopy (1999, 2000); Shoulder arthroscopy (2008); Knee arthroscopy (1999, 2000); and Dilatation & currettage/hysteroscopy with resectoscope (N/A, 04/06/2014).   Her  family history includes Cancer (age of onset: 5) in her mother; Cerebral palsy in her sister; Diabetes in her maternal grandmother; Heart disease in her father; Hyperlipidemia in her father; Hypertension in her brother, father, and mother; Kidney disease in her father.  She   reports that she has never smoked. She has never used smokeless tobacco. She reports that she does not drink alcohol or use illicit drugs.  Outpatient Prescriptions Prior to Visit  Medication Sig Dispense Refill  . chlorpheniramine-HYDROcodone (TUSSIONEX PENNKINETIC ER) 10-8 MG/5ML SUER Take 5 mLs by mouth 2 (two) times daily. 60 mL 0  . chlorpheniramine-HYDROcodone (TUSSIONEX PENNKINETIC ER) 10-8 MG/5ML SUER Take 5 mLs by mouth every 12 (twelve) hours as needed for cough. 140 mL 0  . IRON PO Take by mouth.    Marland Kitchen LORazepam (ATIVAN) 1 MG tablet Take 1 tablet (1 mg total) by mouth every 8 (eight) hours as needed for anxiety. 90 tablet 0  .  metoprolol succinate (TOPROL-XL) 100 MG 24 hr tablet Take with or immediately following a meal. 30 tablet 7  . PARoxetine (PAXIL) 20 MG tablet Take 1 tablet (20 mg total) by mouth daily. 30 tablet 5  . POTASSIUM PO Take by mouth.    . Prenat-FeFum-DSS-FA-DHA w/o A (PRENAISSANCE) 29-1.25-325 MG CAPS Take 1 capsule by mouth daily.  12  . valsartan-hydrochlorothiazide (DIOVAN-HCT) 160-12.5 MG tablet Take 1 tablet by mouth daily. 30 tablet 7  . hydrochlorothiazide (HYDRODIURIL) 12.5 MG tablet Take 1 tablet (12.5 mg total) by mouth daily. (Patient not taking: Reported on 11/02/2015) 90 tablet 3  . predniSONE (DELTASONE) 20 MG tablet Two daily with food (Patient not taking: Reported on 12/10/2015) 10 tablet 0  . azithromycin (ZITHROMAX) 250 MG tablet Take 2 tabs PO x 1 dose, then 1 tab PO QD x 4 days (Patient not taking: Reported on 11/22/2015) 6 tablet 0   No facility-administered medications prior to visit.    Social History   Social History  . Marital Status: Married    Spouse Name: N/A  . Number of Children: 0  . Years of Education: 18   Occupational History  . teacher    Social History Main Topics  . Smoking status: Never Smoker   . Smokeless tobacco: Never Used  . Alcohol Use: No  . Drug Use: No  . Sexual Activity:    Partners: Male    Birth Control/ Protection: None   Other Topics Concern  . None   Social History Narrative   [employer] - Equities trader; [employer] planner.  Married - '11. No children. Spends weekdays with  her grandfather who requires some attendance, weekends with her husband in their home. 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      Review of Systems  Constitutional: Positive for chills. Negative for fever and appetite change.  HENT: Positive for congestion, postnasal drip and rhinorrhea. Negative for ear pain, sinus pressure and sore throat.   Eyes: Negative for pain and redness.  Respiratory: Positive for cough. Negative for shortness of breath and  wheezing.   Cardiovascular: Negative for leg swelling.  Gastrointestinal: Negative for nausea, vomiting, abdominal pain, diarrhea, constipation and blood in stool.  Endocrine: Negative for polyuria.  Genitourinary: Negative for dysuria, urgency, frequency and flank pain.  Musculoskeletal: Negative for gait problem.  Skin: Negative for rash.  Neurological: Negative for weakness and headaches.  Psychiatric/Behavioral: Negative for confusion and decreased concentration. The patient is not nervous/anxious.     Objective:  BP 142/92 mmHg  Pulse 85  Temp(Src) 98.3 F (36.8 C) (Oral)  Resp 16  Ht 5' 9.5" (1.765 m)  Wt 212 lb 12.8 oz (96.525 kg)  BMI 30.98 kg/m2  SpO2 98%  LMP 11/01/2015  Physical Exam  Constitutional: She is oriented to person, place, and time. She appears well-developed and well-nourished. No distress.  HENT:  Head: Normocephalic and atraumatic.  Right Ear: External ear normal.  Left Ear: External ear normal.  Nose: Nose normal.  Eyes: Conjunctivae and EOM are normal. Pupils are equal, round, and reactive to light. No scleral icterus.  Neck: Normal range of motion. Neck supple. No tracheal deviation present.  Cardiovascular: Normal rate, regular rhythm and normal heart sounds.   Pulmonary/Chest: Effort normal. No respiratory distress. She has no wheezes. She has no rales.  Abdominal: She exhibits no mass. There is no tenderness. There is no rebound and no guarding.  Musculoskeletal: She exhibits no edema.  Lymphadenopathy:    She has no cervical adenopathy.  Neurological: She is alert and oriented to person, place, and time. Coordination normal.  Skin: Skin is warm and dry. No rash noted.  Psychiatric: She has a normal mood and affect. Her behavior is normal.      Assessment & Plan:   Leslie [name] was seen today for follow-up.  Diagnoses and all orders for this visit:  Bronchitis with bronchospasm  Essential hypertension  Chest pain, unspecified chest pain  type -     valsartan-hydrochlorothiazide (DIOVAN-HCT) 160-12.5 MG tablet; Take 1 tablet by mouth daily.  Other orders -     levofloxacin (LEVAQUIN) 500 MG tablet; Take 1 tablet (500 mg total) by mouth daily. -     pseudoephedrine-guaifenesin (MUCINEX D) 60-600 MG 12 hr tablet; Take 1 tablet by mouth every 12 (twelve) hours. -     chlorpheniramine-HYDROcodone (TUSSIONEX PENNKINETIC ER) 10-8 MG/5ML SUER; Take 5 mLs by mouth 2 (two) times daily.  I have discontinued Leslie [name] azithromycin. I am also having her start on levofloxacin, pseudoephedrine-guaifenesin, and chlorpheniramine-HYDROcodone. Additionally, I am having her maintain her PRENAISSANCE, metoprolol succinate, POTASSIUM PO, IRON PO, hydrochlorothiazide, LORazepam, PARoxetine, chlorpheniramine-HYDROcodone, predniSONE, chlorpheniramine-HYDROcodone, and valsartan-hydrochlorothiazide.  Meds ordered this encounter  Medications  . valsartan-hydrochlorothiazide (DIOVAN-HCT) 160-12.5 MG tablet    Sig: Take 1 tablet by mouth daily.    Dispense:  30 tablet    Refill:  7  . levofloxacin (LEVAQUIN) 500 MG tablet    Sig: Take 1 tablet (500 mg total) by mouth daily.    Dispense:  10 tablet    Refill:  0  . pseudoephedrine-guaifenesin (MUCINEX D) 60-600 MG 12 hr tablet  Sig: Take 1 tablet by mouth every 12 (twelve) hours.    Dispense:  18 tablet    Refill:  0  . chlorpheniramine-HYDROcodone (TUSSIONEX PENNKINETIC ER) 10-8 MG/5ML SUER    Sig: Take 5 mLs by mouth 2 (two) times daily.    Dispense:  60 mL    Refill:  0    Appropriate red flag conditions were discussed with the patient as well as actions that should be taken.  Patient expressed his understanding.  Follow-up: Return if symptoms worsen or fail to improve.  Roselee Culver, MD

## 2015-12-14 ENCOUNTER — Other Ambulatory Visit: Payer: Self-pay

## 2015-12-14 DIAGNOSIS — R918 Other nonspecific abnormal finding of lung field: Secondary | ICD-10-CM

## 2015-12-20 ENCOUNTER — Telehealth: Payer: Self-pay

## 2015-12-20 NOTE — Telephone Encounter (Signed)
Pt wants to know if they use for the CT Scan because she is not a fan of them and if there is another alternative.  Please advise  [phone]

## 2015-12-24 IMAGING — CR DG CHEST 2V
2 series · 2 of 2 positions shown · non-contrast
Comparison: Chest radiograph 04/27/2014

CLINICAL DATA: Patient with history of chest pain.

EXAM:
CHEST  2 VIEW

[PA]
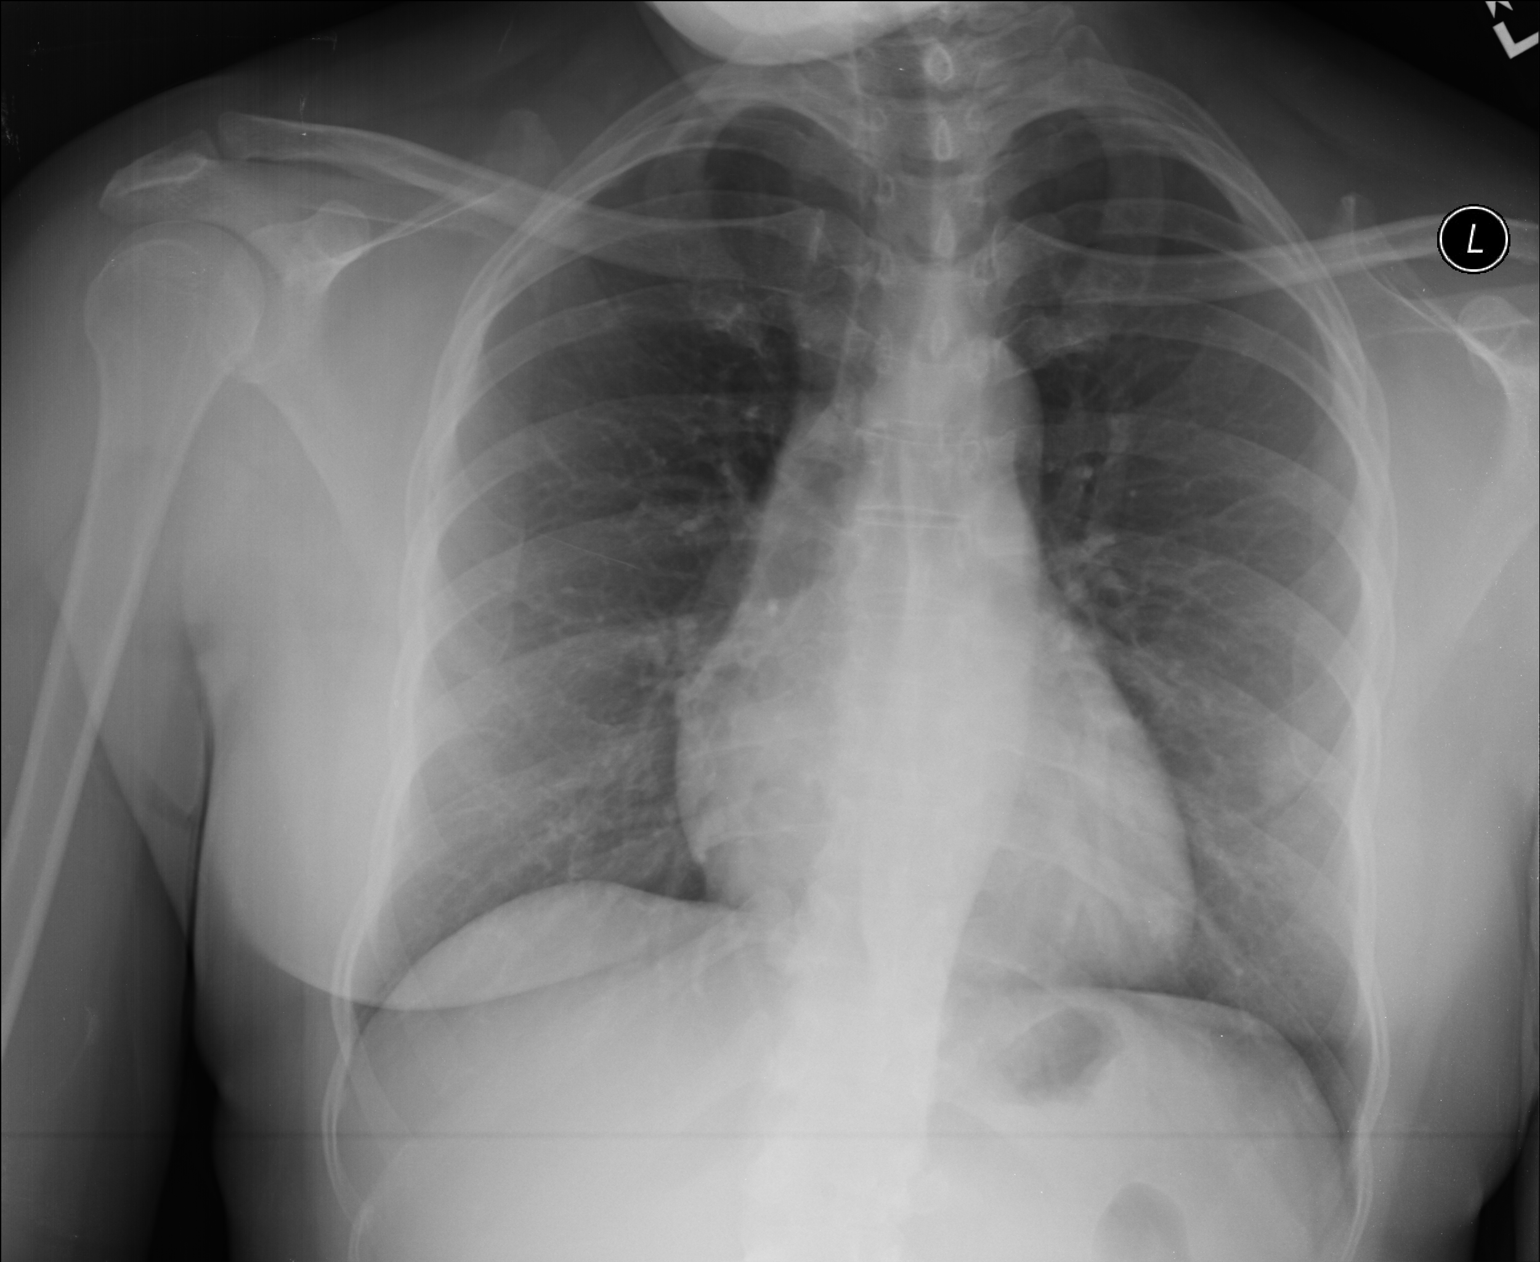

[lateral]
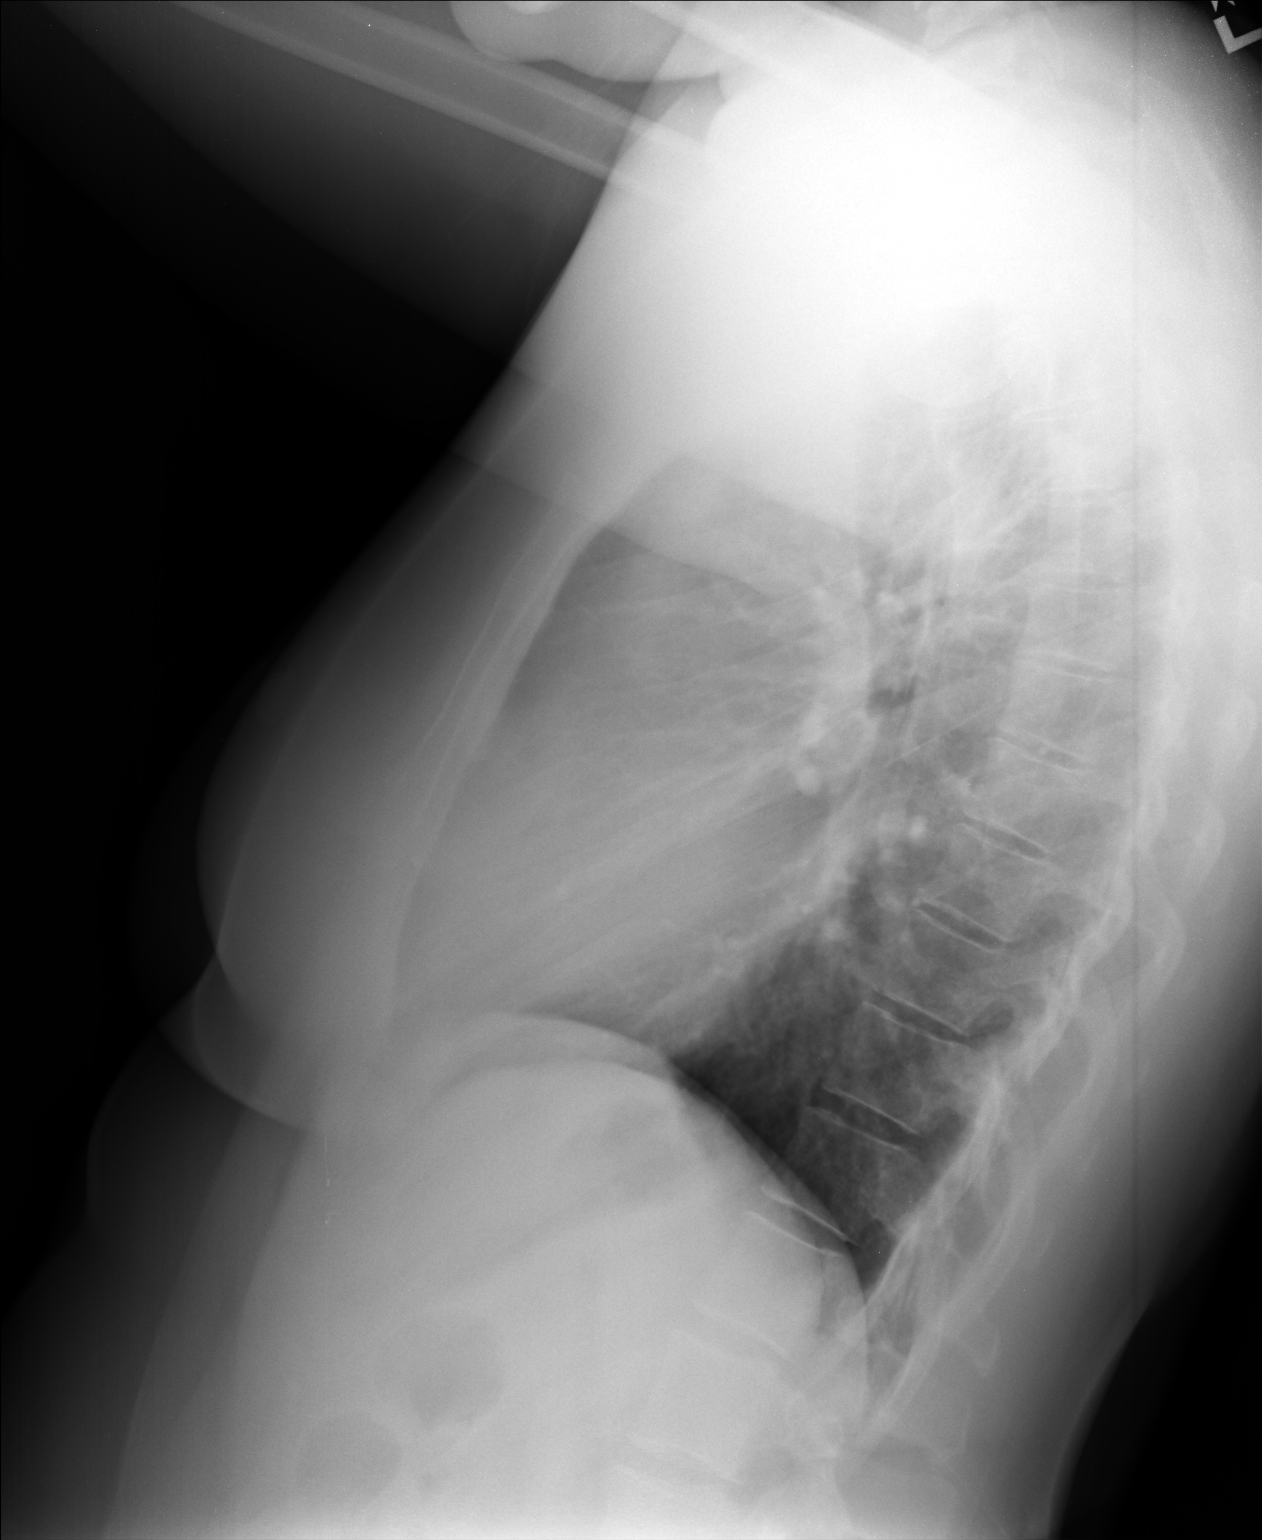

[2 of 2 positions shown; findings below may reference images not displayed]

FINDINGS: Stable cardiac and mediastinal contours. There is a focal nodular
opacity within the left mid lung which measures about 1.4 cm. No
large area of pulmonary consolidation. No pleural effusion or
pneumothorax. Mid thoracic spine degenerative changes.
IMPRESSION: Focal nodular opacity within the left mid lung. Underlying pulmonary
malignancy is not excluded. Recommend correlation with chest CT.

These results will be called to the ordering clinician or
representative by the Radiologist Assistant, and communication
documented in the PACS or zVision Dashboard.

## 2015-12-24 NOTE — Telephone Encounter (Signed)
Left message for pt to call back  °

## 2015-12-26 ENCOUNTER — Other Ambulatory Visit: Payer: BC Managed Care – PPO

## 2015-12-31 ENCOUNTER — Ambulatory Visit (INDEPENDENT_AMBULATORY_CARE_PROVIDER_SITE_OTHER): Payer: BC Managed Care – PPO | Admitting: Emergency Medicine

## 2015-12-31 VITALS — BP 140/90 | HR 81 | Temp 97.9°F | Resp 18 | Ht 69.0 in | Wt 207.0 lb

## 2015-12-31 DIAGNOSIS — R490 Dysphonia: Secondary | ICD-10-CM | POA: Diagnosis not present

## 2015-12-31 MED ORDER — MELOXICAM 15 MG PO TABS: 15.0000 mg | ORAL_TABLET | Freq: Every day | ORAL | Status: DC

## 2015-12-31 MED ORDER — AZELASTINE-FLUTICASONE 137-50 MCG/ACT NA SUSP: 2.0000 | Freq: Two times a day (BID) | NASAL | Status: DC

## 2015-12-31 NOTE — Patient Instructions (Signed)
Hoarseness  Hoarseness is any abnormal change in your voice.Hoarseness can make it difficult to speak. Your voice may sound raspy, breathy, or strained.  Hoarseness is caused by a problem with the vocal cords. The vocal cords are two bands of tissue inside your voice box (larynx). When you speak, your vocal cords move back and forth to create sound. The surfaces of your vocal cords need to be smooth for your voice to sound clear. Swelling or lumps on the vocal cords can cause hoarseness.  Common causes of vocal cord problems include:   Upper airway infection.   A long-term cough.   Straining or overusing your voice.   Smoking.   Allergies.   Vocal cord growths.   Stomach acids that flow up from your stomach and irritate your vocal cords (gastroesophageal reflux).  HOME CARE INSTRUCTIONS  Watch your condition for any changes. To ease any discomfort that you feel:   Rest your voice. Do not whisper. Whispering can cause muscle strain.   Do not speak in a loud or harsh voice that makes your hoarseness worse.   Do not use any tobacco products, including cigarettes, chewing tobacco, or electronic cigarettes. If you need help quitting, ask your health care provider.   Avoid secondhand smoke.   Do not eat foods that give you heartburn. Heartburn can make gastroesophageal reflux worse.   Do not drink coffee.   Do not drink alcohol.   Drink enough fluids to keep your urine clear or pale yellow.   Use a humidifier if the air in your home is dry.  SEEK MEDICAL CARE IF:   You have hoarseness that lasts longer than 3 weeks.   You almost lose or completelylose your voice for longer than 3 days.   You have pain when you swallow or try to talk.   You feel a lump in your neck.  SEEK IMMEDIATE MEDICAL CARE IF:   You have trouble swallowing.   You feel as though you are choking when you swallow.   You cough up blood or vomit blood.   You have trouble breathing.     This information is not intended to replace  advice given to you by your health care provider. Make sure you discuss any questions you have with your health care provider.     Document Released: 11/14/2005 Document Revised: 04/17/2015 Document Reviewed: 11/22/2014  Elsevier Interactive Patient Education 2016 [employer]

## 2015-12-31 NOTE — Progress Notes (Signed)
Subjective:  Patient ID: Leslie [name], female    DOB: 14-Sep-1976  Age: 39 y.o. MRN: [id number]  CC: Follow-up and Laryngitis   HPI Leslie [name] presents   Patient was initially treated for acute bronchitis on December 3. She's been treated repeatedly for upper respiratory infections or hoarseness bronchitis over the intervening time. She has no fever or chills. She been using   A combination steroid and antihistamine nasal spray that she's been given by her sister and that her nasal symptoms have resolved. She has no further nasal discharge has some postnasal drainage but has remained hoarse. She's nonsmoker denies any fever chills nausea vomiting or wheezing or shortness of breath. No cough.  History Leslie [name] has a past medical history of Hypertension; Rotator cuff tear (11-07); Substance abuse; Pregnancy induced hypertension; History of sickle cell trait; Heart disease; Migraine (2006); and Anemia.   She has past surgical history that includes Knee arthroscopy (1999, 2000); Shoulder arthroscopy (2008); Knee arthroscopy (1999, 2000); and Dilatation & currettage/hysteroscopy with resectoscope (N/A, 04/06/2014).   Her  family history includes Cancer (age of onset: 62) in her mother; Cerebral palsy in her sister; Diabetes in her maternal grandmother; Heart disease in her father; Hyperlipidemia in her father; Hypertension in her brother, father, and mother; Kidney disease in her father.  She   reports that she has never smoked. She has never used smokeless tobacco. She reports that she does not drink alcohol or use illicit drugs.  Outpatient Prescriptions Prior to Visit  Medication Sig Dispense Refill  . metoprolol succinate (TOPROL-XL) 100 MG 24 hr tablet Take with or immediately following a meal. 30 tablet 7  . POTASSIUM PO Take by mouth.    . pseudoephedrine-guaifenesin (MUCINEX D) 60-600 MG 12 hr tablet Take 1 tablet by mouth every 12 (twelve) hours. 18 tablet 0  .  valsartan-hydrochlorothiazide (DIOVAN-HCT) 160-12.5 MG tablet Take 1 tablet by mouth daily. 30 tablet 7  . chlorpheniramine-HYDROcodone (TUSSIONEX PENNKINETIC ER) 10-8 MG/5ML SUER Take 5 mLs by mouth 2 (two) times daily. (Patient not taking: Reported on 12/31/2015) 60 mL 0  . chlorpheniramine-HYDROcodone (TUSSIONEX PENNKINETIC ER) 10-8 MG/5ML SUER Take 5 mLs by mouth every 12 (twelve) hours as needed for cough. (Patient not taking: Reported on 12/31/2015) 140 mL 0  . chlorpheniramine-HYDROcodone (TUSSIONEX PENNKINETIC ER) 10-8 MG/5ML SUER Take 5 mLs by mouth 2 (two) times daily. (Patient not taking: Reported on 12/31/2015) 60 mL 0  . hydrochlorothiazide (HYDRODIURIL) 12.5 MG tablet Take 1 tablet (12.5 mg total) by mouth daily. (Patient not taking: Reported on 11/02/2015) 90 tablet 3  . IRON PO Take by mouth. Reported on 12/31/2015    . LORazepam (ATIVAN) 1 MG tablet Take 1 tablet (1 mg total) by mouth every 8 (eight) hours as needed for anxiety. (Patient not taking: Reported on 12/31/2015) 90 tablet 0  . PARoxetine (PAXIL) 20 MG tablet Take 1 tablet (20 mg total) by mouth daily. (Patient not taking: Reported on 12/31/2015) 30 tablet 5  . predniSONE (DELTASONE) 20 MG tablet Two daily with food (Patient not taking: Reported on 12/10/2015) 10 tablet 0  . Prenat-FeFum-DSS-FA-DHA w/o A (PRENAISSANCE) 29-1.25-325 MG CAPS Take 1 capsule by mouth daily. Reported on 12/31/2015  12   No facility-administered medications prior to visit.    Social History   Social History  . Marital Status: Married    Spouse Name: N/A  . Number of Children: 0  . Years of Education: 18   Occupational History  . teacher  Social History Main Topics  . Smoking status: Never Smoker   . Smokeless tobacco: Never Used  . Alcohol Use: No  . Drug Use: No  . Sexual Activity:    Partners: Male    Birth Control/ Protection: None   Other Topics Concern  . None   Social History Narrative   [employer] - Equities trader; [employer]  Media planner.  Married - '11. No children. Spends weekdays with her grandfather who requires some attendance, weekends with her husband in their home. 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      Review of Systems  Constitutional: Negative for fever, chills and appetite change.  HENT: Positive for postnasal drip and voice change. Negative for congestion, ear pain, sinus pressure and sore throat.   Eyes: Negative for pain and redness.  Respiratory: Negative for cough, shortness of breath and wheezing.   Cardiovascular: Negative for leg swelling.  Gastrointestinal: Negative for nausea, vomiting, abdominal pain, diarrhea, constipation and blood in stool.  Endocrine: Negative for polyuria.  Genitourinary: Negative for dysuria, urgency, frequency and flank pain.  Musculoskeletal: Negative for gait problem.  Skin: Negative for rash.  Neurological: Negative for weakness and headaches.  Psychiatric/Behavioral: Negative for confusion and decreased concentration. The patient is not nervous/anxious.     Objective:  BP 140/90 mmHg  Pulse 81  Temp(Src) 97.9 F (36.6 C) (Oral)  Resp 18  Ht 5\' 9"  (1.753 m)  Wt 207 lb (93.895 kg)  BMI 30.55 kg/m2  SpO2 98%  LMP 12/03/2015  Physical Exam  Constitutional: She is oriented to person, place, and time. She appears well-developed and well-nourished.  HENT:  Head: Normocephalic and atraumatic.  Eyes: Conjunctivae are normal. Pupils are equal, round, and reactive to light.  Pulmonary/Chest: Effort normal.  Musculoskeletal: She exhibits no edema.  Neurological: She is alert and oriented to person, place, and time.  Skin: Skin is dry.  Psychiatric: She has a normal mood and affect. Her behavior is normal. Thought content normal.      Assessment & Plan:   Leslie [name] was seen today for follow-up and laryngitis.  Diagnoses and all orders for this visit:  Hoarseness of voice -     Ambulatory referral to ENT  Other orders -     meloxicam  (MOBIC) 15 MG tablet; Take 1 tablet (15 mg total) by mouth daily. -     Azelastine-Fluticasone 137-50 MCG/ACT SUSP; Place 2 sprays into the nose 2 (two) times daily.   I am having Leslie [name] start on meloxicam and Azelastine-Fluticasone. I am also having her maintain her PRENAISSANCE, metoprolol succinate, POTASSIUM PO, IRON PO, hydrochlorothiazide, LORazepam, PARoxetine, chlorpheniramine-HYDROcodone, predniSONE, chlorpheniramine-HYDROcodone, valsartan-hydrochlorothiazide, pseudoephedrine-guaifenesin, and chlorpheniramine-HYDROcodone.  Meds ordered this encounter  Medications  . meloxicam (MOBIC) 15 MG tablet    Sig: Take 1 tablet (15 mg total) by mouth daily.    Dispense:  30 tablet    Refill:  5  . Azelastine-Fluticasone 137-50 MCG/ACT SUSP    Sig: Place 2 sprays into the nose 2 (two) times daily.    Dispense:  23 g    Refill:  12    Appropriate red flag conditions were discussed with the patient as well as actions that should be taken.  Patient expressed his understanding.  Follow-up: Return if symptoms worsen or fail to improve.  Roselee Culver, MD

## 2016-06-02 ENCOUNTER — Other Ambulatory Visit: Payer: Self-pay | Admitting: Cardiology

## 2016-06-02 ENCOUNTER — Other Ambulatory Visit: Payer: Self-pay | Admitting: *Deleted

## 2016-06-02 DIAGNOSIS — R0602 Shortness of breath: Secondary | ICD-10-CM

## 2016-06-02 DIAGNOSIS — R079 Chest pain, unspecified: Secondary | ICD-10-CM

## 2016-06-02 DIAGNOSIS — R05 Cough: Secondary | ICD-10-CM

## 2016-06-02 DIAGNOSIS — R490 Dysphonia: Secondary | ICD-10-CM

## 2016-06-02 DIAGNOSIS — R059 Cough, unspecified: Secondary | ICD-10-CM

## 2016-06-02 MED ORDER — VALSARTAN-HYDROCHLOROTHIAZIDE 160-12.5 MG PO TABS
1.0000 | ORAL_TABLET | Freq: Every day | ORAL | Status: DC
Start: 2016-06-02 — End: 2016-06-13

## 2016-06-02 MED ORDER — METOPROLOL SUCCINATE ER 100 MG PO TB24: 100.0000 mg | ORAL_TABLET | Freq: Every day | ORAL | Status: DC

## 2016-06-02 MED ORDER — VALSARTAN-HYDROCHLOROTHIAZIDE 160-12.5 MG PO TABS
1.0000 | ORAL_TABLET | Freq: Every day | ORAL | Status: DC
Start: 2016-06-02 — End: 2016-06-02

## 2016-06-02 NOTE — Telephone Encounter (Signed)
Follow Up  Pt calling to follow up on the refill being sent in

## 2016-06-02 NOTE — Telephone Encounter (Signed)
*  STAT* If patient is at the pharmacy, call can be transferred to refill team.   1. Which medications need to be refilled? (please list name of each medication and dose if known) Diovan- 12.5, metoprolol 100mg   2. Which pharmacy/location (including street and city if local pharmacy) is medication to be sent to?[facility]   3. Do they need a 30 day or 90 day supply? 30 both

## 2016-06-13 ENCOUNTER — Encounter: Payer: Self-pay | Admitting: Cardiology

## 2016-06-13 ENCOUNTER — Ambulatory Visit (INDEPENDENT_AMBULATORY_CARE_PROVIDER_SITE_OTHER): Payer: PRIVATE HEALTH INSURANCE | Admitting: Cardiology

## 2016-06-13 VITALS — BP 190/102 | HR 64 | Ht 69.0 in | Wt 219.0 lb

## 2016-06-13 DIAGNOSIS — R0602 Shortness of breath: Secondary | ICD-10-CM | POA: Diagnosis not present

## 2016-06-13 DIAGNOSIS — R079 Chest pain, unspecified: Secondary | ICD-10-CM

## 2016-06-13 LAB — BASIC METABOLIC PANEL
BUN: 14 mg/dL (ref 7–25)
CHLORIDE: 102 mmol/L (ref 98–110)
CO2: 21 mmol/L (ref 20–31)
Calcium: 8.8 mg/dL (ref 8.6–10.2)
Creat: 0.64 mg/dL (ref 0.50–1.10)
GLUCOSE: 92 mg/dL (ref 65–99)
POTASSIUM: 4.5 mmol/L (ref 3.5–5.3)
SODIUM: 137 mmol/L (ref 135–146)

## 2016-06-13 MED ORDER — METOPROLOL SUCCINATE ER 100 MG PO TB24: 100.0000 mg | ORAL_TABLET | Freq: Every day | ORAL | Status: DC

## 2016-06-13 MED ORDER — VALSARTAN-HYDROCHLOROTHIAZIDE 160-12.5 MG PO TABS: 1.0000 | ORAL_TABLET | Freq: Every day | ORAL | Status: DC

## 2016-06-13 NOTE — Patient Instructions (Signed)
Medication Instructions:  Please make sure you are taking the full dose of your Metoprolol Succinate and Diovan.  Labwork: BMET today  Testing/Procedures: None  Follow-Up: Your physician recommends that you schedule a follow-up appointment in: 2 weeks with [facility].  Your physician wants you to follow-up in: 6 months with Dr. Aundra Dubin.  You will receive a reminder letter in the mail two months in advance. If you don't receive a letter, please call our office to schedule the follow-up appointment.    Any Other Special Instructions Will Be Listed Below (If Applicable).     If you need a refill on your cardiac medications before your next appointment, please call your pharmacy.

## 2016-06-13 NOTE — Progress Notes (Signed)
06/13/2016 Leslie [name] Leslie [name]   Jul 07, 1976  [id number]  Primary Physician Leslie Slocumb, PA-C Primary Cardiologist: Dr. Aundra [name]   Reason for Visit/CC: 6 month f/u for HTN  HPI:  40 y/o AAF, followed by Dr. Aundra [name]. She has had HTN for a number of years that has been poorly controlled. AT her sast OV with Dr. Aundra [name] 08/2015, her BP was 160/104. This was after running out of her metoprolol XL. She has also been on valsartan/HCTZ. She was on amlodipine in the past but unable to tolerate due to peripheral edema. She does not want to take BID/TID medications. She had a 2D echo 09/2015 that showed an EF of 65-70%, Grade I DD and mild MR.   Today in clinic, her BP is 190/102. She is very unconcerned with her HTN. She does not feel this is a dangerous issue. She is not fully compliant with her meds. She does not take them consistently. She often cuts her Diovan in half b/c " it makes me sleepy". She also is not mindful of her diet. She does not watch her sodium intake. She seasons her food with sea salt, eats hibachi ~3x weeks, processed meats (hot dogs). She also drinks a moderate amount of caffeine.  She does not get much exercise.   Despite her hypertension she denies any CP or dyspnea. No edema. No HA, visual changes, dizziness.    Current Outpatient Prescriptions  Medication Sig Dispense Refill  . IRON PO Take 1 tablet by mouth every morning. Reported on 12/31/2015    . meloxicam (MOBIC) 15 MG tablet Take 1 tablet (15 mg total) by mouth daily. 30 tablet 5  . metoprolol succinate (TOPROL-XL) 100 MG 24 hr tablet Take 1 tablet (100 mg total) by mouth daily. Take with or immediately following a meal. 90 tablet 3  . Prenat-FeFum-DSS-FA-DHA w/o A (PRENAISSANCE) 29-1.25-325 MG CAPS Take 1 capsule by mouth daily. Reported on 12/31/2015  12  . valsartan-hydrochlorothiazide (DIOVAN-HCT) 160-12.5 MG tablet Take 1 tablet by mouth daily. Pt taking one half tab 90 tablet 3   No current facility-administered  medications for this visit.    Allergies  Allergen Reactions  . Amlodipine     edema  . Ibuprofen Other (See Comments)    swelling  . Lisinopril Rash    REACTION: Rash    Social History   Social History  . Marital Status: Married    Spouse Name: N/A  . Number of Children: 0  . Years of Education: 18   Occupational History  . teacher    Social History Main Topics  . Smoking status: Never Smoker   . Smokeless tobacco: Never Used  . Alcohol Use: No  . Drug Use: No  . Sexual Activity:    Partners: Male    Birth Control/ Protection: None   Other Topics Concern  . Not on file   Social History Narrative   [employer] - Equities trader; [employer] Media planner.  Married - '11. No children. Spends weekdays with her grandfather who requires some attendance, weekends with her husband in their home. 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      Review of Systems: General: negative for chills, fever, night sweats or weight changes.  Cardiovascular: negative for chest pain, dyspnea on exertion, edema, orthopnea, palpitations, paroxysmal nocturnal dyspnea or shortness of breath Dermatological: negative for rash Respiratory: negative for cough or wheezing Urologic: negative for hematuria Abdominal: negative for nausea, vomiting, diarrhea, bright red blood per rectum, melena, or  hematemesis Neurologic: negative for visual changes, syncope, or dizziness All other systems reviewed and are otherwise negative except as noted above.    Blood pressure 190/102, pulse 64, height 5\' 9"  (1.753 m), weight 219 lb (99.338 kg).  General appearance: alert, cooperative and no distress Neck: no carotid bruit and no JVD Lungs: clear to auscultation bilaterally Heart: regular rate and rhythm, S1, S2 normal, no murmur, click, rub or gallop Extremities: no LEE Pulses: 2+ and symmetric Skin: warm and dry Neurologic: Grossly normal  EKG NSR 64 bpm   ASSESSMENT AND PLAN:   1. HTN: Poorly  controlled in the setting of medical noncompliance. We had a detailed discussion on the importance of BP control, given that HTN is the "slient killer". I informed the patient on the impact of poorly controlled HTN on overall CV health including increased risk of MI, CVA, CHF as well as renal disease. Patient strongly advised to improve compliance but she seems very nonchalant. She said that she will improve her compliance and start taking her meds nightly, but I am doubful of this. We discussed low sodium diet, reduction in caffeine and increasing physical activity. I've ordered a BMP today to assess renal function. F/u in 2 weeks for repeat BP check in HTN clinic. F/u with Dr. Aundra [name] in 6 months.    PLAN  F/u with Dr. Aundra [name] in 6 months  Leslie [name] [facility] PA-C 06/13/2016 9:12 AM

## 2016-06-16 ENCOUNTER — Telehealth: Payer: Self-pay | Admitting: Cardiology

## 2016-06-16 NOTE — Telephone Encounter (Signed)
Returned call to patient.Recent lab results given.

## 2016-06-16 NOTE — Telephone Encounter (Signed)
Fu  Pt returning RN phone call- lab results. Please call back and discuss.   

## 2016-06-27 ENCOUNTER — Ambulatory Visit: Payer: PRIVATE HEALTH INSURANCE

## 2016-07-22 ENCOUNTER — Other Ambulatory Visit: Payer: Self-pay | Admitting: *Deleted

## 2016-07-22 MED ORDER — VALSARTAN-HYDROCHLOROTHIAZIDE 160-12.5 MG PO TABS: 1.0000 | ORAL_TABLET | Freq: Every day | ORAL | 1 refills | Status: DC

## 2016-07-22 MED ORDER — VALSARTAN-HYDROCHLOROTHIAZIDE 160-12.5 MG PO TABS: 1.0000 | ORAL_TABLET | Freq: Every day | ORAL | 0 refills | Status: DC

## 2016-08-06 ENCOUNTER — Ambulatory Visit (INDEPENDENT_AMBULATORY_CARE_PROVIDER_SITE_OTHER): Payer: PRIVATE HEALTH INSURANCE | Admitting: Physician Assistant

## 2016-08-06 VITALS — BP 130/80 | HR 92 | Temp 98.1°F | Resp 16 | Ht 69.0 in | Wt 216.8 lb

## 2016-08-06 DIAGNOSIS — M5136 Other intervertebral disc degeneration, lumbar region: Secondary | ICD-10-CM | POA: Insufficient documentation

## 2016-08-06 DIAGNOSIS — Z111 Encounter for screening for respiratory tuberculosis: Secondary | ICD-10-CM | POA: Diagnosis not present

## 2016-08-06 DIAGNOSIS — Z0000 Encounter for general adult medical examination without abnormal findings: Secondary | ICD-10-CM

## 2016-08-06 DIAGNOSIS — M51369 Other intervertebral disc degeneration, lumbar region without mention of lumbar back pain or lower extremity pain: Secondary | ICD-10-CM | POA: Insufficient documentation
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
## 2016-08-06 NOTE — Progress Notes (Signed)
  Tuberculosis Risk Questionnaire  1. No Were you born outside the [place] in one of the following parts of the world: [place], [place], [place], [place] or [place]?    2. No Have you traveled outside the [place] and lived for more than one month in one of the following parts of the world: [place], [place], [place], [place] or [place]?    3. No Do you have a compromised immune system such as from any of the following conditions:HIV/AIDS, organ or bone marrow transplantation, diabetes, immunosuppressive medicines (e.g. Prednisone, Remicaide), leukemia, lymphoma, cancer of the head or neck, gastrectomy or jejunal bypass, end-stage renal disease (on dialysis), or silicosis?     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    5. No Have you ever: injected illegal drugs, used crack cocaine, lived in a homeless shelter  or been in jail or prison?     6. No Have you ever been exposed to anyone with infectious tuberculosis?    Tuberculosis Symptom Questionnaire  Do you currently have any of the following symptoms?  1. NO Unexplained cough lasting more than 3 weeks?   2. No Unexplained fever lasting more than 3 weeks.   3. No Night Sweats (sweating that leaves the bedclothes and sheets wet)     4. No Shortness of Breath   5. No Chest Pain   6. No Unintentional weight loss    7. No Unexplained fatigue (very tired for no reason)

## 2016-08-06 NOTE — Patient Instructions (Addendum)
   IF you received an x-ray today, you will receive an invoice from [facility]. Please contact [facility] at [phone] with questions or concerns regarding your invoice.   IF you received labwork today, you will receive an invoice from [facility]. Please contact [facility] at [phone] with questions or concerns regarding your invoice.   Our billing staff will not be able to assist you with questions regarding bills from these companies.  You will be contacted with the lab results as soon as they are available. The fastest way to get your results is to activate your My Chart account. Instructions are located on the last page of this paperwork. If you have not heard from us regarding the results in 2 weeks, please contact this office.     Keeping You Healthy  Get These Tests 1. Blood Pressure- Have your blood pressure checked once a year by your health care provider.  Normal blood pressure is 120/80. 2. Weight- Have your body mass index (BMI) calculated to screen for obesity.  BMI is measure of body fat based on height and weight.  You can also calculate your own BMI at www.nhlbisupport.com/bmi/. 3. Cholesterol- Have your cholesterol checked every 5 years starting at age 20 then yearly starting at age 45. 4. Chlamydia, HIV, and other sexually transmitted diseases- Get screened every year until age 25, then within three months of each new sexual provider. 5. Pap Test - Every 1-5 years; discuss with your health care provider. 6. Mammogram- Every 1-2 years starting at age 40--50  Take these medicines  Calcium with Vitamin D-Your body needs 1200 mg of Calcium each day and 800-1000 IU of Vitamin D daily.  Your body can only absorb 500 mg of Calcium at a time so Calcium must be taken in 2 or 3 divided doses throughout the day.  Multivitamin with folic acid- Once daily if it is possible for you to become pregnant.  Get these  Immunizations  Gardasil-Series of three doses; prevents HPV related illness such as genital warts and cervical cancer.  Menactra-Single dose; prevents meningitis.  Tetanus shot- Every 10 years.  Flu shot-Every year.  Take these steps 1. Do not smoke-Your healthcare provider can help you quit.  For tips on how to quit go to www.smokefree.gov or call 1-800 QUITNOW. 2. Be physically active- Exercise 5 days a week for at least 30 minutes.  If you are not already physically active, start slow and gradually work up to 30 minutes of moderate physical activity.  Examples of moderate activity include walking briskly, dancing, swimming, bicycling, etc. 3. Breast Cancer- A self breast exam every month is important for early detection of breast cancer.  For more information and instruction on self breast exams, ask your healthcare provider or www.womenshealth.gov/faq/breast-self-exam.cfm. 4. Eat a healthy diet- Eat a variety of healthy foods such as fruits, vegetables, whole grains, low fat milk, low fat cheeses, yogurt, lean meats, poultry and fish, beans, nuts, tofu, etc.  For more information go to www. Thenutritionsource.org 5. Drink alcohol in moderation- Limit alcohol intake to one drink or less per day. Never drink and drive. 6. Depression- Your emotional health is as important as your physical health.  If you're feeling down or losing interest in things you normally enjoy please talk to your healthcare provider about being screened for depression. 7. Dental visit- Brush and floss your teeth twice daily; visit your dentist twice a year. 8. Eye doctor- Get an eye exam at least every   2 years. 9. Helmet use- Always wear a helmet when riding a bicycle, motorcycle, rollerblading or skateboarding. 10. Safe sex- If you may be exposed to sexually transmitted infections, use a condom. 11. Seat belts- Seat belts can save your live; always wear one. 12. Smoke/Carbon Monoxide detectors- These detectors need to  be installed on the appropriate level of your home. Replace batteries at least once a year. 13. Skin cancer- When out in the sun please cover up and use sunscreen 15 SPF or higher. 14. Violence- If anyone is threatening or hurting you, please tell your healthcare provider.        

## 2016-08-08 ENCOUNTER — Ambulatory Visit (INDEPENDENT_AMBULATORY_CARE_PROVIDER_SITE_OTHER): Payer: PRIVATE HEALTH INSURANCE | Admitting: Physician Assistant

## 2016-08-08 DIAGNOSIS — Z111 Encounter for screening for respiratory tuberculosis: Secondary | ICD-10-CM

## 2016-08-08 LAB — TB SKIN TEST
INDURATION: 0 mm
TB SKIN TEST: NEGATIVE

## 2016-08-08 NOTE — Progress Notes (Signed)
Patient presented for PPD reading only. Jp/cma

## 2016-11-22 ENCOUNTER — Ambulatory Visit (HOSPITAL_COMMUNITY)
Admission: EM | Admit: 2016-11-22 | Discharge: 2016-11-22 | Disposition: A | Discharge status: Home / Self Care | Payer: PRIVATE HEALTH INSURANCE | Attending: Family Medicine | Admitting: Family Medicine

## 2016-11-22 ENCOUNTER — Encounter (HOSPITAL_COMMUNITY): Payer: Self-pay | Admitting: Emergency Medicine

## 2016-11-22 DIAGNOSIS — J0110 Acute frontal sinusitis, unspecified: Secondary | ICD-10-CM

## 2016-11-22 DIAGNOSIS — R05 Cough: Secondary | ICD-10-CM | POA: Diagnosis not present

## 2016-11-22 DIAGNOSIS — I10 Essential (primary) hypertension: Secondary | ICD-10-CM | POA: Diagnosis not present

## 2016-11-22 DIAGNOSIS — R059 Cough, unspecified: Secondary | ICD-10-CM

## 2016-11-22 LAB — POCT I-STAT, CHEM 8
BUN: 11 mg/dL (ref 6–20)
CHLORIDE: 102 mmol/L (ref 101–111)
Calcium, Ion: 1.13 mmol/L — ABNORMAL LOW (ref 1.15–1.40)
Creatinine, Ser: 0.6 mg/dL (ref 0.44–1.00)
Glucose, Bld: 98 mg/dL (ref 65–99)
HEMATOCRIT: 41 % (ref 36.0–46.0)
Hemoglobin: 13.9 g/dL (ref 12.0–15.0)
Potassium: 3.9 mmol/L (ref 3.5–5.1)
SODIUM: 139 mmol/L (ref 135–145)
TCO2: 26 mmol/L (ref 0–100)

## 2016-11-22 MED ORDER — HYDROCODONE-HOMATROPINE 5-1.5 MG/5ML PO SYRP: 5.0000 mL | ORAL_SOLUTION | Freq: Four times a day (QID) | ORAL | 0 refills | Status: DC | PRN

## 2016-11-22 MED ORDER — AMOXICILLIN 500 MG PO CAPS: 500.0000 mg | ORAL_CAPSULE | Freq: Two times a day (BID) | ORAL | 0 refills | Status: DC

## 2016-11-22 MED ORDER — VALSARTAN-HYDROCHLOROTHIAZIDE 160-12.5 MG PO TABS: 1.0000 | ORAL_TABLET | Freq: Every day | ORAL | 1 refills | Status: DC

## 2016-11-22 NOTE — ED Triage Notes (Signed)
Here for cold sx onset 2 weeks associated w/fevers, ST, congestion, dry cough, fatigue, HA  BP today is 487/104... Reports she has run out of BP meds  A&O x4... NAD... Husband at bedside
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
## 2016-11-22 NOTE — Discharge Instructions (Signed)
I am going to give you an antibiotic for probable infectious sinusitis. Avoid any decongestants or anti-inflammatories (melxoicam also) while your blood pressure is elevated. May take cough medication as needed. Mucinex is also good for congestion. Drink plenty of fluids and rest. FU with your pcp for further evaluation of your blood pressure. Change your Diovan to back to daily as directed. Your kidney function was normal here.

## 2017-01-06 ENCOUNTER — Ambulatory Visit (HOSPITAL_COMMUNITY)
Admission: EM | Admit: 2017-01-06 | Discharge: 2017-01-06 | Disposition: A | Discharge status: Home / Self Care | Payer: PRIVATE HEALTH INSURANCE | Attending: Family Medicine | Admitting: Family Medicine

## 2017-01-06 ENCOUNTER — Encounter (HOSPITAL_COMMUNITY): Payer: Self-pay | Admitting: *Deleted

## 2017-01-06 DIAGNOSIS — J9801 Acute bronchospasm: Secondary | ICD-10-CM

## 2017-01-06 DIAGNOSIS — Z760 Encounter for issue of repeat prescription: Secondary | ICD-10-CM

## 2017-01-06 DIAGNOSIS — I10 Essential (primary) hypertension: Secondary | ICD-10-CM

## 2017-01-06 DIAGNOSIS — J069 Acute upper respiratory infection, unspecified: Secondary | ICD-10-CM | POA: Diagnosis not present

## 2017-01-06 DIAGNOSIS — B9789 Other viral agents as the cause of diseases classified elsewhere: Secondary | ICD-10-CM

## 2017-01-06 MED ORDER — LEVALBUTEROL TARTRATE 45 MCG/ACT IN AERO: 1.0000 | INHALATION_SPRAY | RESPIRATORY_TRACT | 12 refills | Status: DC | PRN

## 2017-01-06 MED ORDER — METOPROLOL SUCCINATE ER 100 MG PO TB24: 100.0000 mg | ORAL_TABLET | Freq: Every day | ORAL | 0 refills | Status: DC

## 2017-01-06 MED ORDER — VALSARTAN-HYDROCHLOROTHIAZIDE 160-12.5 MG PO TABS: 1.0000 | ORAL_TABLET | Freq: Every day | ORAL | 0 refills | Status: DC
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
## 2017-01-06 NOTE — ED Notes (Signed)
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

## 2017-01-06 NOTE — Discharge Instructions (Signed)
Sudafed PE 10 mg every 4 to 6 hours as needed for congestion Allegra or Zyrtec daily as needed for drainage and runny nose. For stronger antihistamine may take Chlor-Trimeton 2 to 4 mg every 4 to 6 hours, may cause drowsiness. Saline nasal spray used frequently. Ibuprofen 600 mg every 6 hours as needed for pain, discomfort or fever. Drink plenty of fluids and stay well-hydrated. There is a risk of taking medications prescribed by a health care provider without obtaining a complete history, labwork or proper physical exam, etc. This cannot be completed adequately at an urgent care. There can be multiple problems, some serious,  associated with medications and undetermined conditions of your health status. This action is performed as a last resort in order to supply you with medication. By receiving these prescriptions you are ackowleging and accepting these risks and will not hold the prescriber or any agent of [facility] as responsible for any adverse outcomes.  One of the side effects of taking Toprol or metoprolol is that it can make your wheezing worse. If you are taking this medicine any wheezing gets worse he may consider stopping it. He will need to follow-up with your primary care provider as soon as possible. The other medication listed above old Sudafed PE as a decongestant  can also raise your blood pressure. You may consider holding off this medication and use saline nasal spray instead.

## 2017-01-06 NOTE — ED Notes (Signed)
Patient on phone when attempting to discharge patient

## 2017-01-06 NOTE — ED Notes (Signed)
Patient no longer on the phone and ready for instructions

## 2017-01-06 NOTE — ED Triage Notes (Addendum)
Patient reports nasal congestion, cough, sore throat, body aches, and fever since November. States was given prescription for penicillin on previous visit and was feeling better but it has now returned. Has been taking OTC mucinex.

## 2017-03-05 ENCOUNTER — Telehealth: Payer: Self-pay | Admitting: Cardiology
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
## 2017-03-17 ENCOUNTER — Ambulatory Visit: Payer: Self-pay | Admitting: Nurse Practitioner

## 2017-03-20 DIAGNOSIS — E669 Obesity, unspecified: Secondary | ICD-10-CM | POA: Diagnosis not present

## 2017-03-20 DIAGNOSIS — I160 Hypertensive urgency: Secondary | ICD-10-CM | POA: Diagnosis not present

## 2017-03-20 DIAGNOSIS — R0789 Other chest pain: Secondary | ICD-10-CM | POA: Diagnosis not present

## 2017-03-20 DIAGNOSIS — I119 Hypertensive heart disease without heart failure: Secondary | ICD-10-CM | POA: Diagnosis not present

## 2017-03-21 DIAGNOSIS — E785 Hyperlipidemia, unspecified: Secondary | ICD-10-CM | POA: Diagnosis not present

## 2017-03-21 DIAGNOSIS — I10 Essential (primary) hypertension: Secondary | ICD-10-CM | POA: Diagnosis not present

## 2017-03-26 DIAGNOSIS — E785 Hyperlipidemia, unspecified: Secondary | ICD-10-CM | POA: Diagnosis not present

## 2017-03-26 DIAGNOSIS — E669 Obesity, unspecified: Secondary | ICD-10-CM | POA: Diagnosis not present

## 2017-03-26 DIAGNOSIS — I119 Hypertensive heart disease without heart failure: Secondary | ICD-10-CM | POA: Diagnosis not present

## 2017-03-30 ENCOUNTER — Ambulatory Visit (INDEPENDENT_AMBULATORY_CARE_PROVIDER_SITE_OTHER): Payer: Self-pay | Admitting: Physician Assistant

## 2017-04-07 ENCOUNTER — Ambulatory Visit: Payer: Self-pay | Admitting: Nurse Practitioner

## 2017-04-23 ENCOUNTER — Ambulatory Visit (INDEPENDENT_AMBULATORY_CARE_PROVIDER_SITE_OTHER): Payer: BLUE CROSS/BLUE SHIELD | Admitting: Physician Assistant

## 2017-04-23 ENCOUNTER — Encounter (INDEPENDENT_AMBULATORY_CARE_PROVIDER_SITE_OTHER): Payer: Self-pay | Admitting: Physician Assistant

## 2017-04-23 VITALS — BP 160/106 | HR 78 | Temp 97.8°F | Ht 70.0 in | Wt 219.8 lb

## 2017-04-23 DIAGNOSIS — Z9119 Patient's noncompliance with other medical treatment and regimen: Secondary | ICD-10-CM

## 2017-04-23 DIAGNOSIS — I10 Essential (primary) hypertension: Secondary | ICD-10-CM

## 2017-04-23 DIAGNOSIS — Z91199 Patient's noncompliance with other medical treatment and regimen due to unspecified reason: Secondary | ICD-10-CM

## 2017-04-23 DIAGNOSIS — E039 Hypothyroidism, unspecified: Secondary | ICD-10-CM | POA: Diagnosis not present

## 2017-04-23 MED ORDER — METOPROLOL SUCCINATE ER 100 MG PO TB24: 100.0000 mg | ORAL_TABLET | Freq: Every day | ORAL | 0 refills | Status: DC

## 2017-04-23 MED ORDER — VALSARTAN-HYDROCHLOROTHIAZIDE 320-25 MG PO TABS: 1.0000 | ORAL_TABLET | Freq: Every day | ORAL | 0 refills | Status: DC

## 2017-04-23 NOTE — Progress Notes (Signed)
Subjective:  Patient ID: Leslie [name], female    DOB: 08-20-1976  Age: 41 y.o. MRN: [id number]  CC: establish care   HPI Leslie [name] is a 41 y.o. female with a pertinent PMH of HTN and hypothyroidism presents to establish a PCP. Says she is followed by cardiology and is prescribed Diovan HCT 320-25 mg and Toprol XL100 mg . Takes 1/4 pill of Diovan and whole tab of Toprol XL . Last cardiology note states she has been extensively counseled on non compliance and educated about HTN. Non compliance because she feels too fatigued with whole tab of Diovan and previous insurance coverage issues. Also does not take Amlodipine due to LE swelling. Not taking her statin because she says she does not have a cholesterol problem. She was shown that her LDL is 148 and she said she is still not taking her statin. Takes synthroid "once in a while".  States that she refuses to take any other alternative anti-hypertensives. She has strong convictions to taking medications. Wants to stay at a quarter pill of Diovan HCT. Would like to begin treatment with holistic/natural medicine. Does not endorse CP, palpitations, SOB, HA, abdominal pain, f/c/n/v, rash, or GI/GU sxs.     Outpatient Medications Prior to Visit  Medication Sig Dispense Refill  . meloxicam (MOBIC) 15 MG tablet Take 1 tablet (15 mg total) by mouth daily. 30 tablet 5  . metoprolol succinate (TOPROL XL) 100 MG 24 hr tablet Take 1 tablet (100 mg total) by mouth daily. Take with or immediately following a meal. 30 tablet 0  . IRON PO Take 1 tablet by mouth every morning. Reported on 12/31/2015    . levalbuterol (XOPENEX HFA) 45 MCG/ACT inhaler Inhale 1-2 puffs into the lungs every 4 (four) hours as needed for wheezing. (Patient not taking: Reported on 04/23/2017) 1 Inhaler 12  . Prenat-FeFum-DSS-FA-DHA w/o A (PRENAISSANCE) 29-1.25-325 MG CAPS Take 1 capsule by mouth daily. Reported on 12/31/2015  12  . valsartan-hydrochlorothiazide (DIOVAN HCT) 160-12.5 MG  tablet Take 1 tablet by mouth daily. 30 tablet 0   No facility-administered medications prior to visit.      ROS Review of Systems  Constitutional: Negative for chills, fever and malaise/fatigue.  Eyes: Negative for blurred vision.  Respiratory: Negative for shortness of breath.   Cardiovascular: Negative for chest pain and palpitations.  Gastrointestinal: Negative for abdominal pain and nausea.  Genitourinary: Negative for dysuria and hematuria.  Musculoskeletal: Negative for joint pain and myalgias.  Skin: Negative for rash.  Neurological: Negative for tingling and headaches.  Psychiatric/Behavioral: Negative for depression. The patient is not nervous/anxious.     Objective:  BP (!) 160/106 (BP Location: Left Arm, Patient Position: Sitting, Cuff Size: Large)   Pulse 78   Temp 97.8 F (36.6 C) (Oral)   Ht 5\' 10"  (1.778 m)   Wt 219 lb 12.8 oz (99.7 kg)   LMP 04/09/2017   SpO2 99%   BMI 31.54 kg/m   BP/Weight 04/23/2017 01/06/2017 40/08/8118  Systolic BP 147 829 562  Diastolic BP 130 865 784  Wt. (Lbs) 219.8 - -  BMI 31.54 - -      Physical Exam  Constitutional: She is oriented to person, place, and time.  Well developed, overweight, NAD, polite  HENT:  Head: Normocephalic and atraumatic.  Eyes: No scleral icterus.  Neck: Normal range of motion. Neck supple. Thyromegaly (mild left sided thyromegaly ) present.  Cardiovascular: Normal rate, regular rhythm and normal heart sounds.   No carotid  bruit  Pulmonary/Chest: Effort normal and breath sounds normal.  Abdominal: Soft. Bowel sounds are normal. There is no tenderness.  Musculoskeletal: She exhibits no edema.  Neurological: She is alert and oriented to person, place, and time.  Skin: Skin is warm and dry. No rash noted. No erythema. No pallor.  Psychiatric: She has a normal mood and affect. Her behavior is normal. Thought content normal.  Vitals reviewed.    Assessment & Plan:   1. Essential hypertension -  Comprehensive metabolic panel - CBC with Differential - Refill valsartan-hydrochlorothiazide (DIOVAN-HCT) 320-25 MG tablet; Take 1 tablet by mouth daily.  Dispense: 90 tablet; Refill: 0 - Refill metoprolol succinate (TOPROL XL) 100 MG 24 hr tablet; Take 1 tablet (100 mg total) by mouth daily. Take with or immediately following a meal.  Dispense: 90 tablet; Refill: 0  2. Hypothyroidism, unspecified type - Thyroid Panel With TSH - Not taking synthroid as directed   3. Non-compliance - Pt counseled on the importance of taking her medications as prescribed. She remained adamant about taking medications as she feels appropriate. She was not open to trying other antihypertensives.   Meds ordered this encounter  Medications  . valsartan-hydrochlorothiazide (DIOVAN-HCT) 320-25 MG tablet    Sig: Take 1 tablet by mouth daily.    Dispense:  90 tablet    Refill:  0    Order Specific Question:   Supervising Provider    Answer:   Tresa Garter [id number]  . metoprolol succinate (TOPROL XL) 100 MG 24 hr tablet    Sig: Take 1 tablet (100 mg total) by mouth daily. Take with or immediately following a meal.    Dispense:  90 tablet    Refill:  0    Order Specific Question:   Supervising Provider    Answer:   Tresa Garter [[id number]]    Follow-up: Return in about 4 weeks (around 05/21/2017) for full physical.   Clent Demark PA

## 2017-04-23 NOTE — Patient Instructions (Signed)
Managing Your Hypertension Hypertension is commonly called high blood pressure. This is when the force of your blood pressing against the walls of your arteries is too strong. Arteries are blood vessels that carry blood from your heart throughout your body. Hypertension forces the heart to work harder to pump blood, and may cause the arteries to become narrow or stiff. Having untreated or uncontrolled hypertension can cause heart attack, stroke, kidney disease, and other problems. What are blood pressure readings? A blood pressure reading consists of a higher number over a lower number. Ideally, your blood pressure should be below 120/80. The first ("top") number is called the systolic pressure. It is a measure of the pressure in your arteries as your heart beats. The second ("bottom") number is called the diastolic pressure. It is a measure of the pressure in your arteries as the heart relaxes. What does my blood pressure reading mean? Blood pressure is classified into four stages. Based on your blood pressure reading, your health care provider may use the following stages to determine what type of treatment you need, if any. Systolic pressure and diastolic pressure are measured in a unit called mm Hg. Normal   Systolic pressure: below 120.  Diastolic pressure: below 80. Elevated   Systolic pressure: 120-129.  Diastolic pressure: below 80. Hypertension stage 1     Diastolic pressure: 80-89. Hypertension stage 2   Systolic pressure: 140 or above.  Diastolic pressure: 90 or above. What health risks are associated with hypertension? Managing your hypertension is an important responsibility. Uncontrolled hypertension can lead to:  A heart attack.  A stroke.  A weakened blood vessel (aneurysm).  Heart failure.  Kidney damage.  Eye damage.  Metabolic syndrome.  Memory and concentration problems. What changes can I make to manage my hypertension? Eating and drinking   Eat a  diet that is high in fiber and potassium, and low in salt (sodium), added sugar, and fat. An example eating plan is called the DASH (Dietary Approaches to Stop Hypertension) diet. To eat this way:  Eat plenty of fresh fruits and vegetables. Try to fill half of your plate at each meal with fruits and vegetables.  Eat whole grains, such as whole wheat pasta, brown rice, or whole grain bread. Fill about one quarter of your plate with whole grains.  Eat low-fat diary products.  Avoid fatty cuts of meat, processed or cured meats, and poultry with skin. Fill about one quarter of your plate with lean proteins such as fish, chicken without skin, beans, eggs, and tofu.  Avoid premade and processed foods. These tend to be higher in sodium, added sugar, and fat.     Lifestyle   Work with your health care provider to maintain a healthy body weight, or to lose weight. Ask what an ideal weight is for you.  Get at least 30 minutes of exercise that causes your heart to beat faster (aerobic exercise) most days of the week. Activities may include walking, swimming, or biking.       Monitoring   Monitor your blood pressure at home as told by your health care provider. Your personal target blood pressure may vary depending on your medical conditions, your age, and other factors.  Have your blood pressure checked regularly, as often as told by your health care provider. Working with your health care provider   Review all the medicines you take with your health care provider because there may be side effects or interactions.  Talk with your health   care provider about your diet, exercise habits, and other lifestyle factors that may be contributing to hypertension.  Visit your health care provider regularly. Your health care provider can help you create and adjust your plan for managing hypertension. Will I need medicine to control my blood pressure? Your health care provider may prescribe medicine if  lifestyle changes are not enough to get your blood pressure under control, and if:  Your systolic blood pressure is 130 or higher.  Your diastolic blood pressure is 80 or higher. Take medicines only as told by your health care provider. Follow the directions carefully. Blood pressure medicines must be taken as prescribed. The medicine does not work as well when you skip doses. Skipping doses also puts you at risk for problems. Contact a health care provider if:  You think you are having a reaction to medicines you have taken.  You have repeated (recurrent) headaches.  You feel dizzy.  You have swelling in your ankles.  You have trouble with your vision. Get help right away if:  You develop a severe headache or confusion.  You have unusual weakness or numbness, or you feel faint.  You have severe pain in your chest or abdomen.  You vomit repeatedly.  You have trouble breathing. Summary  Hypertension is when the force of blood pumping through your arteries is too strong. If this condition is not controlled, it may put you at risk for serious complications.  Your personal target blood pressure may vary depending on your medical conditions, your age, and other factors. For most people, a normal blood pressure is less than 120/80.  Hypertension is managed by lifestyle changes, medicines, or both. Lifestyle changes include weight loss, eating a healthy, low-sodium diet, exercising more, and limiting alcohol. This information is not intended to replace advice given to you by your health care provider. Make sure you discuss any questions you have with your health care provider. Document Released: 08/25/2012 Document Revised: 10/29/2016 Document Reviewed: 10/29/2016 Elsevier Interactive Patient Education  2017 [employer]  

## 2017-05-06 ENCOUNTER — Ambulatory Visit (INDEPENDENT_AMBULATORY_CARE_PROVIDER_SITE_OTHER): Payer: BLUE CROSS/BLUE SHIELD | Admitting: Nurse Practitioner

## 2017-05-06 ENCOUNTER — Encounter: Payer: Self-pay | Admitting: Nurse Practitioner

## 2017-05-06 VITALS — BP 160/100 | HR 72 | Ht 70.0 in | Wt 219.8 lb

## 2017-05-06 DIAGNOSIS — I10 Essential (primary) hypertension: Secondary | ICD-10-CM | POA: Diagnosis not present

## 2017-05-06 MED ORDER — METOPROLOL SUCCINATE ER 100 MG PO TB24: 100.0000 mg | ORAL_TABLET | Freq: Every day | ORAL | 2 refills | Status: DC

## 2017-05-06 MED ORDER — VALSARTAN-HYDROCHLOROTHIAZIDE 320-25 MG PO TABS: 1.0000 | ORAL_TABLET | Freq: Every day | ORAL | 2 refills | Status: DC

## 2017-05-06 NOTE — Patient Instructions (Addendum)
We will be checking the following labs today - BMET   Medication Instructions:    Continue with your current medicines.   I have sent in your refills  I would advise not taking Ibuprofen, Advil, Aleve, etc. Tylenol is a more safer option    Testing/Procedures To Be Arranged:  Echocardiogram  Follow-Up:   See Dr. Stanford Breed as planned in July    Other Special Instructions:   N/A    If you need a refill on your cardiac medications before your next appointment, please call your pharmacy.   Call the [place] office at [phone] if you have any questions, problems or concerns.
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
## 2017-05-07 LAB — BASIC METABOLIC PANEL
BUN/Creatinine Ratio: 16 (ref 9–23)
BUN: 16 mg/dL (ref 6–24)
CO2: 24 mmol/L (ref 18–29)
Calcium: 9.3 mg/dL (ref 8.7–10.2)
Chloride: 104 mmol/L (ref 96–106)
Creatinine, Ser: 0.97 mg/dL (ref 0.57–1.00)
GFR calc Af Amer: 84 mL/min/{1.73_m2} (ref >59)
GFR calc non Af Amer: 73 mL/min/{1.73_m2} (ref >59)
Glucose: 100 mg/dL — ABNORMAL HIGH (ref 65–99)
Potassium: 3.9 mmol/L (ref 3.5–5.2)
Sodium: 143 mmol/L (ref 134–144)

## 2017-05-08 ENCOUNTER — Telehealth (INDEPENDENT_AMBULATORY_CARE_PROVIDER_SITE_OTHER): Payer: Self-pay | Admitting: Physician Assistant

## 2017-05-08 ENCOUNTER — Telehealth: Payer: Self-pay | Admitting: Nurse Practitioner

## 2017-05-08 NOTE — Telephone Encounter (Signed)
New message    Pt is returning call to [facility] about labs. She said she will not be available until about 1205.

## 2017-05-08 NOTE — Telephone Encounter (Signed)
Patient left voicemail stating did not get labs done at [facility] because they were done during her cardiology appt.  Stated just wanted to let Domenica Fail PA aware they were done at cardiology office.

## 2017-05-18 ENCOUNTER — Telehealth (INDEPENDENT_AMBULATORY_CARE_PROVIDER_SITE_OTHER): Payer: Self-pay | Admitting: Physician Assistant

## 2017-05-18 NOTE — Telephone Encounter (Signed)
FWD to PCP. Tempestt S Roberts, CMA  

## 2017-05-18 NOTE — Telephone Encounter (Signed)
Patient left voicemail stated would like to talk to nurse or Domenica Fail PA, patient has concerns with her voice.  Would like referral to chest or throat specialist.  Please follow up with patient.

## 2017-05-19 NOTE — Telephone Encounter (Signed)
Patient came in stated wanted to make sure Domenica Fail PA got her message regarding referral she is requesting.  Declined appt with Domenica Fail PA,  Stated only wants referral to ENT specialist.  Please follow up with patient.

## 2017-05-19 NOTE — Telephone Encounter (Signed)
I am sorry but I can not refer to a specialist without examining patient. However, if she has an insurance that allows specialty appointments without referral, then she can go to her ENT of choice.

## 2017-05-20 NOTE — Telephone Encounter (Signed)
Left patient a message informing her that referral can not be placed without examination in the office. And that she can verify with her insurance if she even needs to have a referral to be seen by a specialist. Instructed patient to call office with any questions. Nat Christen, CMA

## 2017-05-21 ENCOUNTER — Ambulatory Visit (HOSPITAL_COMMUNITY): Payer: BLUE CROSS/BLUE SHIELD | Attending: Cardiology

## 2017-05-21 ENCOUNTER — Other Ambulatory Visit: Payer: Self-pay

## 2017-05-21 DIAGNOSIS — I10 Essential (primary) hypertension: Secondary | ICD-10-CM | POA: Insufficient documentation

## 2017-05-21 DIAGNOSIS — I700 Atherosclerosis of aorta: Secondary | ICD-10-CM | POA: Insufficient documentation

## 2017-05-21 DIAGNOSIS — I5189 Other ill-defined heart diseases: Secondary | ICD-10-CM | POA: Insufficient documentation

## 2017-05-21 DIAGNOSIS — I517 Cardiomegaly: Secondary | ICD-10-CM | POA: Insufficient documentation

## 2017-05-22 DIAGNOSIS — J382 Nodules of vocal cords: Secondary | ICD-10-CM | POA: Diagnosis not present

## 2017-05-22 DIAGNOSIS — R490 Dysphonia: Secondary | ICD-10-CM | POA: Diagnosis not present

## 2017-05-26 ENCOUNTER — Ambulatory Visit (INDEPENDENT_AMBULATORY_CARE_PROVIDER_SITE_OTHER): Payer: BLUE CROSS/BLUE SHIELD | Admitting: Physician Assistant

## 2017-05-29 ENCOUNTER — Telehealth: Payer: Self-pay | Admitting: Nurse Practitioner

## 2017-05-29 NOTE — Telephone Encounter (Signed)
Patient returning call, thanks. °
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
## 2017-06-29 ENCOUNTER — Ambulatory Visit (INDEPENDENT_AMBULATORY_CARE_PROVIDER_SITE_OTHER): Payer: BLUE CROSS/BLUE SHIELD | Admitting: Cardiology

## 2017-06-29 ENCOUNTER — Encounter: Payer: Self-pay | Admitting: Cardiology

## 2017-06-29 VITALS — BP 166/118 | HR 70 | Ht 70.0 in | Wt 217.0 lb

## 2017-06-29 DIAGNOSIS — I10 Essential (primary) hypertension: Secondary | ICD-10-CM

## 2017-06-29 MED ORDER — LABETALOL HCL 200 MG PO TABS: 200.0000 mg | ORAL_TABLET | Freq: Two times a day (BID) | ORAL | 3 refills | Status: DC

## 2017-06-29 NOTE — Patient Instructions (Signed)
Medication Instructions:   STOP METOPROLOL  START LABETALOL 200 MG ONE TABLET TWICE DAILY  TAKE ONE WHOLE TABLET OF THE VALSARTAN/HCTZ   Labwork:  Your physician recommends that you return for lab work in: ONE WEEK  Follow-Up:  Your physician recommends that you schedule a follow-up appointment in: [facility]=TRACK BP AND BRING LIST AND BP CUFF TO [place]  Your physician recommends that you schedule a follow-up appointment in: [place]    If you need a refill on your cardiac medications before your next appointment, please call your pharmacy.

## 2017-07-07 ENCOUNTER — Ambulatory Visit: Payer: BLUE CROSS/BLUE SHIELD
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
## 2017-08-18 DIAGNOSIS — B353 Tinea pedis: Secondary | ICD-10-CM | POA: Diagnosis not present

## 2017-10-16 ENCOUNTER — Encounter: Payer: Self-pay | Admitting: Cardiology
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
## 2017-10-30 ENCOUNTER — Ambulatory Visit: Payer: BLUE CROSS/BLUE SHIELD | Admitting: Cardiology

## 2017-10-30 ENCOUNTER — Encounter: Payer: Self-pay | Admitting: Cardiology

## 2017-10-30 VITALS — BP 174/94 | HR 94 | Ht 70.0 in | Wt 219.2 lb

## 2017-10-30 DIAGNOSIS — I10 Essential (primary) hypertension: Secondary | ICD-10-CM

## 2017-10-30 DIAGNOSIS — Z9119 Patient's noncompliance with other medical treatment and regimen: Secondary | ICD-10-CM

## 2017-10-30 DIAGNOSIS — Z91199 Patient's noncompliance with other medical treatment and regimen due to unspecified reason: Secondary | ICD-10-CM

## 2017-10-30 MED ORDER — LABETALOL HCL 300 MG PO TABS: 300.0000 mg | ORAL_TABLET | Freq: Two times a day (BID) | ORAL | 3 refills | Status: DC

## 2017-10-30 NOTE — Patient Instructions (Signed)
Medication Instructions:   INCREASE LABETALOL TO 300 MG TWICE DAILY= 1 AND 1/2 OF THE 200 MG TABLETS TWICE DAILY  Labwork:  Your physician recommends that you return for lab work NEXT WEEK  Follow-Up:  Your physician recommends that you schedule a follow-up appointment in: [place] MD   Your physician wants you to follow-up in: [place] will receive a reminder letter in the mail two months in advance. If you don't receive a letter, please call our office to schedule the follow-up appointment.    If you need a refill on your cardiac medications before your next appointment, please call your pharmacy.

## 2017-11-02 ENCOUNTER — Telehealth: Payer: Self-pay | Admitting: Cardiology

## 2017-11-02 NOTE — Telephone Encounter (Signed)
Left message for patient to call.  She needs 6-8 week visit with Pharm D per Dr. Stanford Breed.

## 2017-11-04 DIAGNOSIS — I10 Essential (primary) hypertension: Secondary | ICD-10-CM | POA: Diagnosis not present

## 2017-11-05 LAB — BASIC METABOLIC PANEL
BUN / CREAT RATIO: 17 (ref 9–23)
BUN: 13 mg/dL (ref 6–24)
CO2: 26 mmol/L (ref 20–29)
Calcium: 9.5 mg/dL (ref 8.7–10.2)
Chloride: 102 mmol/L (ref 96–106)
Creatinine, Ser: 0.78 mg/dL (ref 0.57–1.00)
GFR calc non Af Amer: 94 mL/min/{1.73_m2} (ref >59)
GFR, EST AFRICAN AMERICAN: 108 mL/min/{1.73_m2} (ref >59)
Glucose: 86 mg/dL (ref 65–99)
Potassium: 3.8 mmol/L (ref 3.5–5.2)
Sodium: 141 mmol/L (ref 134–144)

## 2018-01-01 DIAGNOSIS — E785 Hyperlipidemia, unspecified: Secondary | ICD-10-CM | POA: Diagnosis not present

## 2018-01-01 DIAGNOSIS — I119 Hypertensive heart disease without heart failure: Secondary | ICD-10-CM | POA: Diagnosis not present

## 2018-01-01 DIAGNOSIS — I5030 Unspecified diastolic (congestive) heart failure: Secondary | ICD-10-CM | POA: Diagnosis not present

## 2018-01-27 ENCOUNTER — Encounter (INDEPENDENT_AMBULATORY_CARE_PROVIDER_SITE_OTHER): Payer: Self-pay | Admitting: Physician Assistant

## 2018-01-27 ENCOUNTER — Ambulatory Visit (INDEPENDENT_AMBULATORY_CARE_PROVIDER_SITE_OTHER): Payer: BLUE CROSS/BLUE SHIELD | Admitting: Physician Assistant

## 2018-01-27 VITALS — BP 166/100 | HR 84 | Temp 98.0°F | Resp 18 | Ht 70.0 in | Wt 217.0 lb

## 2018-01-27 DIAGNOSIS — J040 Acute laryngitis: Secondary | ICD-10-CM | POA: Diagnosis not present

## 2018-01-27 DIAGNOSIS — Z9119 Patient's noncompliance with other medical treatment and regimen: Secondary | ICD-10-CM

## 2018-01-27 DIAGNOSIS — Z760 Encounter for issue of repeat prescription: Secondary | ICD-10-CM

## 2018-01-27 DIAGNOSIS — Z91199 Patient's noncompliance with other medical treatment and regimen due to unspecified reason: Secondary | ICD-10-CM

## 2018-01-27 MED ORDER — AZITHROMYCIN 250 MG PO TABS: ORAL_TABLET | ORAL | 0 refills | Status: DC

## 2018-01-27 MED ORDER — LEVALBUTEROL TARTRATE 45 MCG/ACT IN AERO: 1.0000 | INHALATION_SPRAY | RESPIRATORY_TRACT | 12 refills | Status: DC | PRN

## 2018-01-27 MED ORDER — PREDNISONE 20 MG PO TABS: 20.0000 mg | ORAL_TABLET | Freq: Every day | ORAL | 0 refills | Status: DC

## 2018-01-27 NOTE — Patient Instructions (Signed)
Laryngitis Laryngitis is inflammation of your vocal cords. This causes hoarseness, coughing, loss of voice, sore throat, or a dry throat. Your vocal cords are two bands of muscles that are found in your throat. When you speak, these cords come together and vibrate. These vibrations come out through your mouth as sound. When your vocal cords are inflamed, your voice sounds different. Laryngitis can be temporary (acute) or long-term (chronic). Most cases of acute laryngitis improve with time. Chronic laryngitis is laryngitis that lasts for more than three weeks. What are the causes? Acute laryngitis may be caused by:  A viral infection.  Lots of talking, yelling, or singing. This is also called vocal strain.  Bacterial infections.  Chronic laryngitis may be caused by:  Vocal strain.  Injury to your vocal cords.  Acid reflux (gastroesophageal reflux disease or GERD).  Allergies.  Sinus infection.  Smoking.  Alcohol abuse.  Breathing in chemicals or dust.  Growths on the vocal cords.  What increases the risk? Risk factors for laryngitis include:  Smoking.  Alcohol abuse.  Having allergies.  What are the signs or symptoms? Symptoms of laryngitis may include:  Low, hoarse voice.  Loss of voice.  Dry cough.  Sore throat.  Stuffy nose.  How is this diagnosed? Laryngitis may be diagnosed by:  Physical exam.  Throat culture.  Blood test.  Laryngoscopy. This procedure allows your health care provider to look at your vocal cords with a mirror or viewing tube.  How is this treated? Treatment for laryngitis depends on what is causing it. Usually, treatment involves resting your voice and using medicines to soothe your throat. However, if your laryngitis is caused by a bacterial infection, you may need to take antibiotic medicine. If your laryngitis is caused by a growth, you may need to have a procedure to remove it. Follow these instructions at home:  Drink  enough fluid to keep your urine clear or pale yellow.  Breathe in moist air. Use a humidifier if you live in a dry climate.  Take medicines only as directed by your health care provider.  If you were prescribed an antibiotic medicine, finish it all even if you start to feel better.  Do not smoke cigarettes or electronic cigarettes. If you need help quitting, ask your health care provider.  Talk as little as possible. Also avoid whispering, which can cause vocal strain.  Write instead of talking. Do this until your voice is back to normal. Contact a health care provider if:  You have a fever.  You have increasing pain.  You have difficulty swallowing. Get help right away if:  You cough up blood.  You have trouble breathing. This information is not intended to replace advice given to you by your health care provider. Make sure you discuss any questions you have with your health care provider. Document Released: 12/01/2005 Document Revised: 05/08/2016 Document Reviewed: 05/16/2014 Elsevier Interactive Patient Education  2018 [employer]vier Inc[employer]  

## 2018-01-27 NOTE — Progress Notes (Signed)
Subjective:  Patient ID: Leslie [name], female    DOB: 11-Apr-1976  Age: 42 y.o. MRN: [id number]  CC: laryngitis  HPI Leslie [name] is a 42 y.o. female with a medical history of HTN, substance abuse, migraine, sickle cell trait, rotator cuff tear, miscarriage, and anemia presents with laryngitis for two weeks. Thinks she had strep throat and took "left over" Azithromycin from a prescription written "a year ago". Felt the Azithromycin helped reduce her sore throat.     BP is noted to be elevated. Says she usually takes Labetalol once a day instead of twice per day as directed. Says she is too busy and too tired to remember to take medications as directed. Has been to cardiologist and was found to be noncompliant with other antihypertensives. Patient says she would rather stop the medications and take natural supplements. Does not endorse CP, palpitations, SOB, HA, abdominal pain, f/c/n/v, rash, swelling, or GI/GU sxs.     Outpatient Medications Prior to Visit  Medication Sig Dispense Refill  . IRON PO Take 1 tablet by mouth every morning. Reported on 12/31/2015    . labetalol (NORMODYNE) 300 MG tablet Take 1 tablet (300 mg total) 2 (two) times daily by mouth. 180 tablet 3  . levalbuterol (XOPENEX HFA) 45 MCG/ACT inhaler Inhale 1-2 puffs into the lungs every 4 (four) hours as needed for wheezing. 1 Inhaler 12  . Prenat-FeFum-DSS-FA-DHA w/o A (PRENAISSANCE) 29-1.25-325 MG CAPS Take 1 capsule by mouth daily. Reported on 12/31/2015  12  . valsartan-hydrochlorothiazide (DIOVAN-HCT) 320-25 MG tablet Take 1 tablet by mouth daily. 90 tablet 2   No facility-administered medications prior to visit.      ROS Review of Systems  Constitutional: Negative for chills, fever and malaise/fatigue.  HENT:       Losing voice  Eyes: Negative for blurred vision.  Respiratory: Negative for shortness of breath.   Cardiovascular: Negative for chest pain and palpitations.  Gastrointestinal: Negative for  abdominal pain and nausea.  Genitourinary: Negative for dysuria and hematuria.  Musculoskeletal: Negative for joint pain and myalgias.  Skin: Negative for rash.  Neurological: Negative for tingling and headaches.  Psychiatric/Behavioral: Negative for depression. The patient is not nervous/anxious.     Objective:  BP (!) 166/100 (BP Location: Left Arm, Patient Position: Sitting, Cuff Size: Large)   Pulse 84   Temp 98 F (36.7 C) (Oral)   Resp 18   Ht 5\' 10"  (1.778 m)   Wt 217 lb (98.4 kg)   LMP 12/29/2017   SpO2 98%   BMI 31.14 kg/m   BP/Weight 01/27/2018 10/30/2017 3/53/2992  Systolic BP 426 834 196  Diastolic BP 222 94 979  Wt. (Lbs) 217 219.2 217  BMI 31.14 31.45 31.14      Physical Exam  Constitutional: She is oriented to person, place, and time.  Well developed, overweight, NAD, polite  HENT:  Head: Normocephalic and atraumatic.  Turbinates normal. Oropharynx unremarkable. Voice is moderately hoarse  Eyes: No scleral icterus.  Neck: Normal range of motion. Neck supple. No thyromegaly present.  Cardiovascular: Normal rate, regular rhythm and normal heart sounds.  Pulmonary/Chest: Effort normal and breath sounds normal.  Musculoskeletal: She exhibits no edema.  Lymphadenopathy:    She has no cervical adenopathy.  Neurological: She is alert and oriented to person, place, and time. No cranial nerve deficit. Coordination normal.  Skin: Skin is warm and dry. No rash noted. No erythema. No pallor.  Psychiatric: She has a normal mood and affect. Her  behavior is normal. Thought content normal.  Vitals reviewed.    Assessment & Plan:   1. Laryngitis - predniSONE (DELTASONE) 20 MG tablet; Take 1 tablet (20 mg total) by mouth daily with breakfast.  Dispense: 5 tablet; Refill: 0 - azithromycin (ZITHROMAX) 250 MG tablet; Take two tablets on day one then one tablet daily thereafter.  Dispense: 6 tablet; Refill: 0  2. Noncompliance Unfortunately patient does not take  medications as directed despite having been counseled extensively by myself and cardiologist of her risk of stroke and MI. Patient's prognosis is poor if she does not take medical advise seriously.  3. Medication refill - levalbuterol (XOPENEX HFA) 45 MCG/ACT inhaler; Inhale 1-2 puffs into the lungs every 4 (four) hours as needed for wheezing.  Dispense: 1 Inhaler; Refill: 12   Meds ordered this encounter  Medications  . predniSONE (DELTASONE) 20 MG tablet    Sig: Take 1 tablet (20 mg total) by mouth daily with breakfast.    Dispense:  5 tablet    Refill:  0    Order Specific Question:   Supervising Provider    Answer:   Tresa Garter [id number]  . levalbuterol (XOPENEX HFA) 45 MCG/ACT inhaler    Sig: Inhale 1-2 puffs into the lungs every 4 (four) hours as needed for wheezing.    Dispense:  1 Inhaler    Refill:  12    Order Specific Question:   Supervising Provider    Answer:   Tresa Garter [id number]  . azithromycin (ZITHROMAX) 250 MG tablet    Sig: Take two tablets on day one then one tablet daily thereafter.    Dispense:  6 tablet    Refill:  0    Order Specific Question:   Supervising Provider    Answer:   Tresa Garter [id number]    Follow-up: Return if symptoms worsen or fail to improve.   Clent Demark PA

## 2018-02-03 ENCOUNTER — Ambulatory Visit: Payer: BLUE CROSS/BLUE SHIELD | Admitting: Physician Assistant

## 2018-02-03 ENCOUNTER — Encounter: Payer: Self-pay | Admitting: Physician Assistant

## 2018-02-03 ENCOUNTER — Other Ambulatory Visit: Payer: Self-pay

## 2018-02-03 VITALS — BP 160/102 | HR 75 | Temp 98.3°F | Resp 16 | Ht 70.0 in | Wt 214.8 lb

## 2018-02-03 DIAGNOSIS — J329 Chronic sinusitis, unspecified: Secondary | ICD-10-CM

## 2018-02-03 DIAGNOSIS — J310 Chronic rhinitis: Secondary | ICD-10-CM

## 2018-02-03 DIAGNOSIS — J029 Acute pharyngitis, unspecified: Secondary | ICD-10-CM | POA: Diagnosis not present

## 2018-02-03 DIAGNOSIS — I10 Essential (primary) hypertension: Secondary | ICD-10-CM | POA: Diagnosis not present

## 2018-02-03 LAB — POCT RAPID STREP A (OFFICE): RAPID STREP A SCREEN: NEGATIVE

## 2018-02-03 MED ORDER — GUAIFENESIN ER 1200 MG PO TB12: 1.0000 | ORAL_TABLET | Freq: Two times a day (BID) | ORAL | 1 refills | Status: DC | PRN

## 2018-02-03 MED ORDER — IPRATROPIUM BROMIDE 0.03 % NA SOLN: 2.0000 | Freq: Two times a day (BID) | NASAL | 0 refills | Status: DC

## 2018-02-03 MED ORDER — CETIRIZINE HCL 10 MG PO TABS: 10.0000 mg | ORAL_TABLET | Freq: Every day | ORAL | 0 refills | Status: DC

## 2018-02-03 MED ORDER — LEVOCETIRIZINE DIHYDROCHLORIDE 5 MG PO TABS: 5.0000 mg | ORAL_TABLET | Freq: Every evening | ORAL | 5 refills | Status: DC
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
## 2018-02-03 NOTE — Patient Instructions (Addendum)
Please hydrate with 64 oz of water if not more. You can gargle with warm salt water gargles.  You can take ibuprofen for pain and fever.  This can be 600 mg every 8 hours with food, for example. Make sure you use a humidifier in your main rooms.   Hoarseness Hoarseness is any abnormal change in your voice.Hoarseness can make it difficult to speak. Your voice may sound raspy, breathy, or strained. Hoarseness is caused by a problem with the vocal cords. The vocal cords are two bands of tissue inside your voice box (larynx). When you speak, your vocal cords move back and forth to create sound. The surfaces of your vocal cords need to be smooth for your voice to sound clear. Swelling or lumps on the vocal cords can cause hoarseness. Common causes of vocal cord problems include:  Upper airway infection.  A long-term cough.  Straining or overusing your voice.  Smoking.  Allergies.  Vocal cord growths.  Stomach acids that flow up from your stomach and irritate your vocal cords (gastroesophageal reflux).  Follow these instructions at home: Watch your condition for any changes. To ease any discomfort that you feel:  Rest your voice. Do not whisper. Whispering can cause muscle strain.  Do not speak in a loud or harsh voice that makes your hoarseness worse.  Do not use any tobacco products, including cigarettes, chewing tobacco, or electronic cigarettes. If you need help quitting, ask your health care provider.  Avoid secondhand smoke.  Do not eat foods that give you heartburn. Heartburn can make gastroesophageal reflux worse.  Do not drink coffee.  Do not drink alcohol.  Drink enough fluids to keep your urine clear or pale yellow.  Use a humidifier if the air in your home is dry.  Contact a health care provider if:  You have hoarseness that lasts longer than 3 weeks.  You almost lose or completelylose your voice for longer than 3 days.  You have pain when you swallow or try  to talk.  You feel a lump in your neck. Get help right away if:  You have trouble swallowing.  You feel as though you are choking when you swallow.  You cough up blood or vomit blood.  You have trouble breathing. This information is not intended to replace advice given to you by your health care provider. Make sure you discuss any questions you have with your health care provider. Document Released: 11/14/2005 Document Revised: 05/08/2016 Document Reviewed: 11/22/2014 Elsevier Interactive Patient Education  2018 [employer].     IF you received an x-ray today, you will receive an invoice from [facility] Rad[facility]. Please contact [facility] at [phone] with questions or concerns regarding your invoice.   IF you received labwork today, you will receive an invoice from [place]. Please contact [facility] at [phone] with questions or concerns regarding your invoice.   Our billing staff will not be able to assist you with questions regarding bills from these companies.  You will be contacted with the lab results as soon as they are available. The fastest way to get your results is to activate your My Chart account. Instructions are located on the last page of this paperwork. If you have not heard from [place] regarding the results in 2 weeks, please contact this office.

## 2018-02-11 ENCOUNTER — Ambulatory Visit: Payer: BLUE CROSS/BLUE SHIELD | Admitting: Physician Assistant

## 2018-02-11 ENCOUNTER — Ambulatory Visit (INDEPENDENT_AMBULATORY_CARE_PROVIDER_SITE_OTHER): Payer: BLUE CROSS/BLUE SHIELD

## 2018-02-11 VITALS — BP 162/85 | HR 81 | Temp 97.8°F | Resp 16 | Ht 70.0 in | Wt 214.0 lb

## 2018-02-11 DIAGNOSIS — R058 Other specified cough: Secondary | ICD-10-CM

## 2018-02-11 DIAGNOSIS — J209 Acute bronchitis, unspecified: Secondary | ICD-10-CM | POA: Diagnosis not present

## 2018-02-11 DIAGNOSIS — R05 Cough: Secondary | ICD-10-CM

## 2018-02-11 MED ORDER — ALBUTEROL SULFATE HFA 108 (90 BASE) MCG/ACT IN AERS: 2.0000 | INHALATION_SPRAY | RESPIRATORY_TRACT | 1 refills | Status: DC | PRN

## 2018-02-11 NOTE — Patient Instructions (Addendum)
Please hydrate well with 64 oz of water if not more.   I would like you to take vocal rest as much as possible.    I would also like you to check you blood pressure twice per week.  If this is over 140/90, you need to return to your primary care for blood pressure management.  This is once you are feeling better.    When you start to develop coughing, use the inhaler.  No more than 4 hours as needed   Acute Bronchitis, Adult Acute bronchitis is when air tubes (bronchi) in the lungs suddenly get swollen. The condition can make it hard to breathe. It can also cause these symptoms:  A cough.  Coughing up clear, yellow, or green mucus.  Wheezing.  Chest congestion.  Shortness of breath.  A fever.  Body aches.  Chills.  A sore throat.  Follow these instructions at home: Medicines  Take over-the-counter and prescription medicines only as told by your doctor.  If you were prescribed an antibiotic medicine, take it as told by your doctor. Do not stop taking the antibiotic even if you start to feel better. General instructions  Rest.  Drink enough fluids to keep your pee (urine) clear or pale yellow.  Avoid smoking and secondhand smoke. If you smoke and you need help quitting, ask your doctor. Quitting will help your lungs heal faster.  Use an inhaler, cool mist vaporizer, or humidifier as told by your doctor.  Keep all follow-up visits as told by your doctor. This is important. How is this prevented? To lower your risk of getting this condition again:  Wash your hands often with soap and water. If you cannot use soap and water, use hand sanitizer.  Avoid contact with people who have cold symptoms.  Try not to touch your hands to your mouth, nose, or eyes.  Make sure to get the flu shot every year.  Contact a doctor if:  Your symptoms do not get better in 2 weeks. Get help right away if:  You cough up blood.  You have chest pain.  You have very bad shortness  of breath.  You become dehydrated.  You faint (pass out) or keep feeling like you are going to pass out.  You keep throwing up (vomiting).  You have a very bad headache.  Your fever or chills gets worse. This information is not intended to replace advice given to you by your health care provider. Make sure you discuss any questions you have with your health care provider. Document Released: 05/19/2008 Document Revised: 07/09/2016 Document Reviewed: 05/21/2016 Elsevier Interactive Patient Education  2018 [employer].   IF you received an x-ray today, you will receive an invoice from [facility] Radiology. Please contact [facility] Radiology at [phone] with questions or concerns regarding your invoice.   IF you received labwork today, you will receive an invoice from [place]. Please contact [facility] at [phone] with questions or concerns regarding your invoice.   Our billing staff will not be able to assist you with questions regarding bills from these companies.  You will be contacted with the lab results as soon as they are available. The fastest way to get your results is to activate your My Chart account. Instructions are located on the last page of this paperwork. If you have not heard from [place] regarding the results in 2 weeks, please contact this office.
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
## 2018-02-13 ENCOUNTER — Other Ambulatory Visit: Payer: Self-pay

## 2018-02-13 ENCOUNTER — Ambulatory Visit (HOSPITAL_COMMUNITY)
Admission: EM | Admit: 2018-02-13 | Discharge: 2018-02-13 | Disposition: A | Discharge status: Home / Self Care | Payer: BLUE CROSS/BLUE SHIELD | Attending: Family Medicine | Admitting: Family Medicine

## 2018-02-13 ENCOUNTER — Encounter (HOSPITAL_COMMUNITY): Payer: Self-pay | Admitting: *Deleted

## 2018-02-13 DIAGNOSIS — J208 Acute bronchitis due to other specified organisms: Secondary | ICD-10-CM | POA: Diagnosis not present

## 2018-02-13 DIAGNOSIS — B9689 Other specified bacterial agents as the cause of diseases classified elsewhere: Secondary | ICD-10-CM | POA: Diagnosis not present

## 2018-02-13 MED ORDER — BENZONATATE 100 MG PO CAPS: 100.0000 mg | ORAL_CAPSULE | Freq: Three times a day (TID) | ORAL | 0 refills | Status: DC | PRN

## 2018-02-13 MED ORDER — DOXYCYCLINE HYCLATE 100 MG PO TABS: 100.0000 mg | ORAL_TABLET | Freq: Two times a day (BID) | ORAL | 0 refills | Status: AC

## 2018-02-13 NOTE — ED Triage Notes (Signed)
Per pt she is having bronchitis sx
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
## 2018-02-13 NOTE — Discharge Instructions (Signed)
Continue to push fluids, practice good hand hygiene, and cover your mouth if you cough.  If you start having fevers, shaking or shortness of breath, seek immediate care.  For symptoms, consider using Vick's VapoRub on chest or under nose, air humidifier, Benadryl at night, and elevating the head of the bed. Tylenol and ibuprofen for aches and pains you may be experiencing.

## 2018-02-22 ENCOUNTER — Encounter: Payer: Self-pay | Admitting: Physician Assistant

## 2018-02-22 ENCOUNTER — Telehealth (HOSPITAL_COMMUNITY): Payer: Self-pay | Admitting: Emergency Medicine

## 2018-02-22 NOTE — Telephone Encounter (Signed)
Left message for pt returning call

## 2018-02-22 NOTE — Telephone Encounter (Signed)
Pt called asking about meds; sts antibiotics make her nauseated; encouraged to take with food; pt sts feeling better overall

## 2018-03-02 ENCOUNTER — Other Ambulatory Visit: Payer: Self-pay | Admitting: Physician Assistant

## 2018-03-02 DIAGNOSIS — J310 Chronic rhinitis: Secondary | ICD-10-CM

## 2018-03-02 DIAGNOSIS — J329 Chronic sinusitis, unspecified: Secondary | ICD-10-CM

## 2018-03-02 NOTE — Telephone Encounter (Signed)
Cetrizine (Zyrtec) refill Last OV:  Last Refill:not on med list  Pharmacy:[facility] PCP: Colletta [place] English PA  Ipratropium 0.03% spray refill Last OV: 02/03/18  Last Refill:02/03/18 Pharmacy:[facility] PCP: Ivar Drape PA

## 2018-03-24 ENCOUNTER — Encounter: Payer: Self-pay | Admitting: Physician Assistant

## 2018-04-04 ENCOUNTER — Other Ambulatory Visit: Payer: Self-pay | Admitting: Physician Assistant

## 2018-04-04 DIAGNOSIS — J329 Chronic sinusitis, unspecified: Secondary | ICD-10-CM

## 2018-04-04 DIAGNOSIS — J310 Chronic rhinitis: Secondary | ICD-10-CM

## 2018-04-04 IMAGING — DX DG CHEST 2V
2 series · 2 of 2 positions shown · non-contrast
Comparison: 11/02/2015

CLINICAL DATA: Productive cough.

EXAM:
CHEST  2 VIEW

[chest pa]
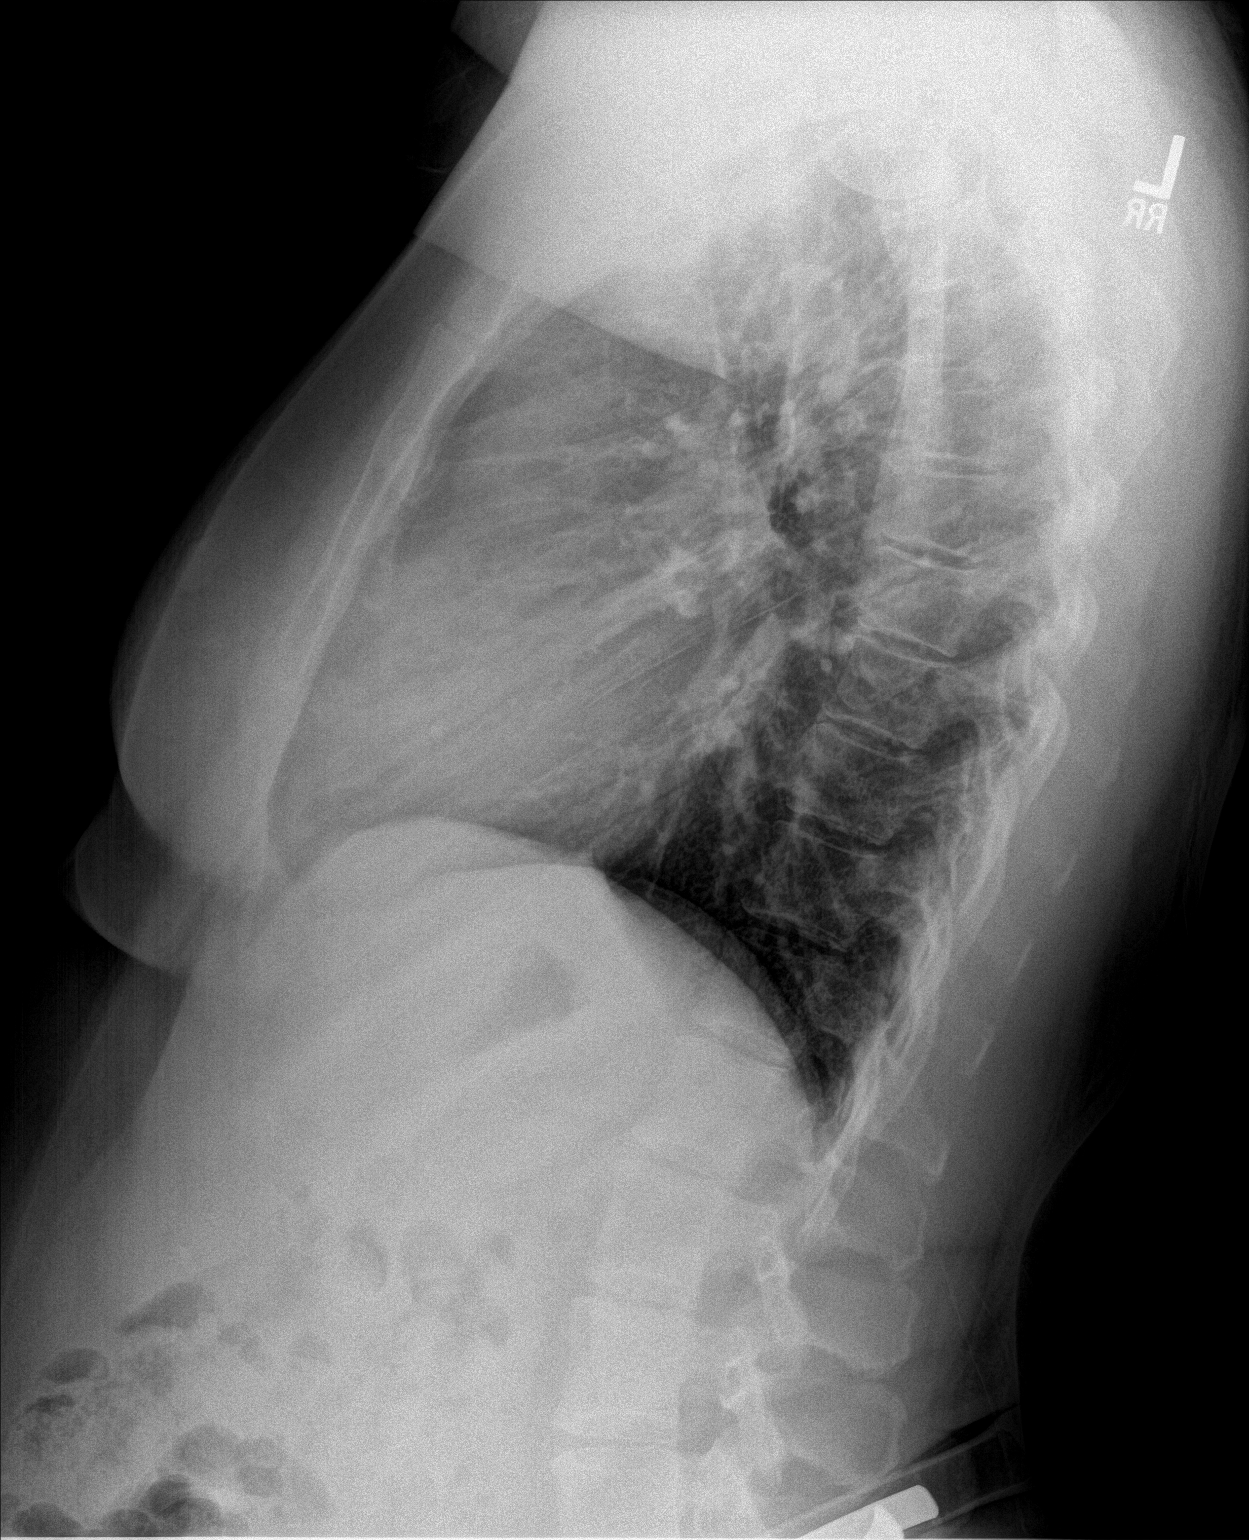

[chest lat]
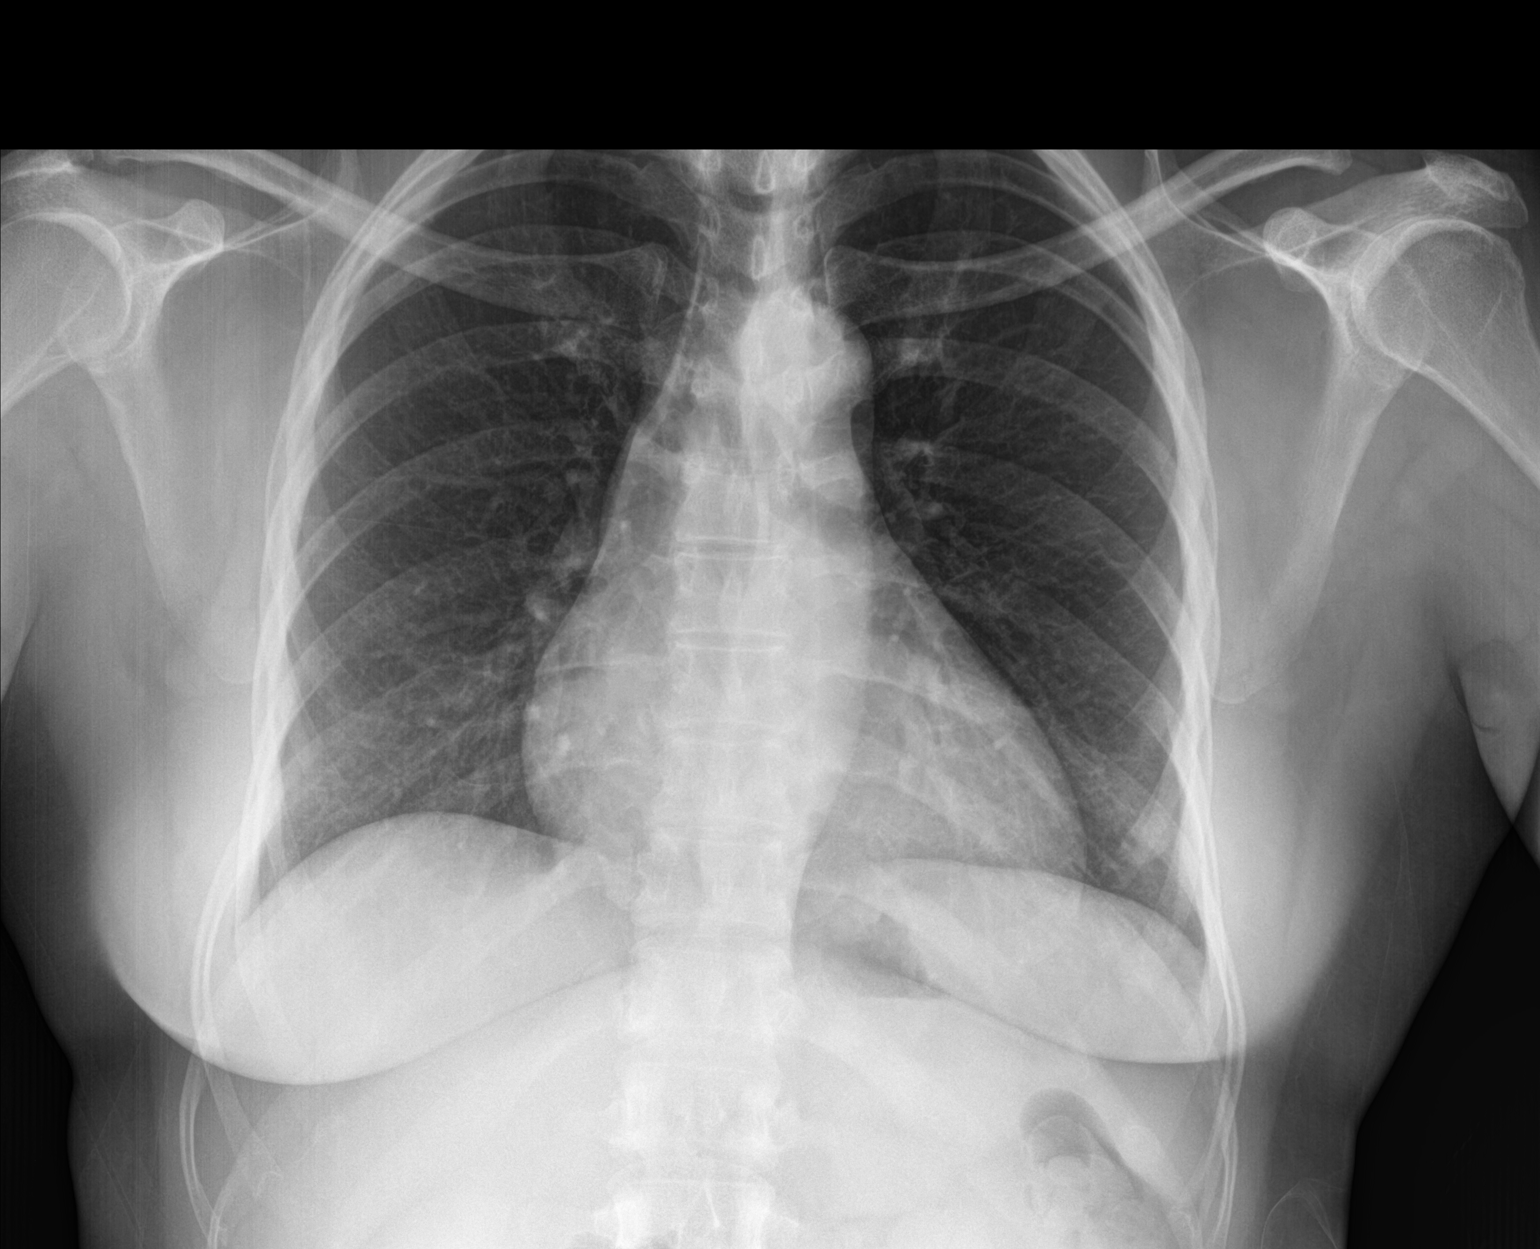

[2 of 2 positions shown; findings below may reference images not displayed]

FINDINGS: Heart size is at the upper limits of normal. Mediastinal shadows are
normal. There is central bronchial thickening but no infiltrate,
collapse or effusion. Bilateral nipple shadows are present. No
significant bone finding.
IMPRESSION: Possible bronchitis.  No consolidation or collapse.

## 2018-04-30 ENCOUNTER — Ambulatory Visit (INDEPENDENT_AMBULATORY_CARE_PROVIDER_SITE_OTHER): Payer: BLUE CROSS/BLUE SHIELD | Admitting: Family Medicine

## 2018-04-30 ENCOUNTER — Encounter: Payer: Self-pay | Admitting: Family Medicine

## 2018-04-30 VITALS — BP 144/100 | HR 97 | Ht 70.0 in | Wt 218.5 lb

## 2018-04-30 DIAGNOSIS — E7800 Pure hypercholesterolemia, unspecified: Secondary | ICD-10-CM

## 2018-04-30 DIAGNOSIS — R0600 Dyspnea, unspecified: Secondary | ICD-10-CM

## 2018-04-30 DIAGNOSIS — D508 Other iron deficiency anemias: Secondary | ICD-10-CM | POA: Diagnosis not present

## 2018-04-30 DIAGNOSIS — I10 Essential (primary) hypertension: Secondary | ICD-10-CM | POA: Diagnosis not present

## 2018-04-30 DIAGNOSIS — R8290 Unspecified abnormal findings in urine: Secondary | ICD-10-CM | POA: Diagnosis not present

## 2018-04-30 DIAGNOSIS — E785 Hyperlipidemia, unspecified: Secondary | ICD-10-CM | POA: Insufficient documentation

## 2018-04-30 DIAGNOSIS — R0609 Other forms of dyspnea: Secondary | ICD-10-CM | POA: Diagnosis not present

## 2018-04-30 DIAGNOSIS — I517 Cardiomegaly: Secondary | ICD-10-CM

## 2018-04-30 NOTE — Progress Notes (Addendum)
Subjective:  Patient ID: Leslie [name], female    DOB: 11-03-1976  Age: 42 y.o. MRN: [id number]  CC: Establish Care and Annual Exam   HPI Leslie [name] presents for establishment of care and to follow-up on her medical issues.  She has a past medical history of what sounds like labile hypertension, cardiomegaly with diastolic heart failure.  She admits some ongoing dyspnea on exertion.  There is no chest pain or palpitations.  She has 2 pillow pillow orthopnea.  This is stable for her.  Last echo showed an normal ejection fraction at 65%.  She is seeing an independent cardiologist who is managing these issues for her.  She has a past medical history of iron deficiency and anemia.  She is still menstruating.  She is status post leiomyectomy.  She sees her GYN doctor for Pap smears.  She has a past medical history of elevated LDL cholesterol.  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  She tells me that the [employer] is in crisis.  This is a stressful situation for her but she loves what she does.  Outpatient Medications Prior to Visit  Medication Sig Dispense Refill  . albuterol (PROVENTIL HFA;VENTOLIN HFA) 108 (90 Base) MCG/ACT inhaler Inhale 1-2 puffs into the lungs every 6 (six) hours as needed for wheezing or shortness of breath.    . cetirizine (ZYRTEC) 10 MG tablet TAKE 1 TABLET BY MOUTH EVERY DAY 30 tablet 0  . IRON PO Take 1 tablet by mouth every morning. Reported on 12/31/2015    . labetalol (NORMODYNE) 300 MG tablet Take 1 tablet (300 mg total) 2 (two) times daily by mouth. 180 tablet 3  . valsartan-hydrochlorothiazide (DIOVAN-HCT) 320-25 MG tablet Take 1 tablet by mouth daily.    Marland Kitchen albuterol (PROVENTIL HFA;VENTOLIN HFA) 108 (90 Base) MCG/ACT inhaler Inhale 2 puffs into the lungs every 4 (four) hours as needed for wheezing or shortness of breath (cough, shortness of breath or wheezing.). 1 Inhaler 1  . amLODipine  (NORVASC) 5 MG tablet Take 1 tablet by mouth daily.    . benzonatate (TESSALON) 100 MG capsule Take 1 capsule (100 mg total) by mouth 3 (three) times daily as needed. 30 capsule 0  . ipratropium (ATROVENT) 0.03 % nasal spray PLACE 2 SPRAYS INTO BOTH NOSTRILS 2 (TWO) TIMES DAILY. 30 mL 0  . levalbuterol (XOPENEX HFA) 45 MCG/ACT inhaler Inhale 1-2 puffs into the lungs every 4 (four) hours as needed for wheezing. 1 Inhaler 12  . levocetirizine (XYZAL) 5 MG tablet Take 1 tablet (5 mg total) by mouth every evening. 30 tablet 5  . losartan-hydrochlorothiazide (HYZAAR) 100-25 MG tablet Take 1 tablet by mouth daily.  3  . metoprolol succinate (TOPROL-XL) 100 MG 24 hr tablet Take 1 tablet by mouth daily.    . valsartan-hydrochlorothiazide (DIOVAN-HCT) 320-25 MG tablet Take 1 tablet by mouth daily. 90 tablet 2   No facility-administered medications prior to visit.     ROS Review of Systems  Constitutional: Negative for activity change, chills, fatigue and unexpected weight change.  HENT: Negative.   Eyes: Negative.   Respiratory: Negative for chest tightness and wheezing.   Cardiovascular: Negative for chest pain and palpitations.  Gastrointestinal: Negative.   Endocrine: Negative for polyphagia and polyuria.  Genitourinary: Negative for difficulty urinating, frequency and urgency.  Musculoskeletal: Negative for gait problem and joint swelling.  Skin: Negative for color change and rash.  Allergic/Immunologic: Negative  for immunocompromised state.  Neurological: Negative for headaches.  Hematological: Negative for adenopathy. Does not bruise/bleed easily.  Psychiatric/Behavioral: Negative.     Objective:  BP (!) 144/100   Pulse 97   Ht 5\' 10"  (1.778 m)   Wt 218 lb 8 oz (99.1 kg)   SpO2 99%   BMI 31.35 kg/m   BP Readings from Last 3 Encounters:  05/22/18 (!) 161/94  04/30/18 (!) 144/100  02/13/18 (!) 163/107    Wt Readings from Last 3 Encounters:  04/30/18 218 lb 8 oz (99.1 kg)   02/11/18 214 lb (97.1 kg)  02/03/18 214 lb 12.8 oz (97.4 kg)    Physical Exam  Constitutional: She is oriented to person, place, and time. She appears well-developed and well-nourished. No distress.  HENT:  Head: Normocephalic and atraumatic.  Right Ear: External ear normal.  Left Ear: External ear normal.  Mouth/Throat: Oropharynx is clear and moist. No oropharyngeal exudate.  Eyes: Pupils are equal, round, and reactive to light. Conjunctivae and EOM are normal. Right eye exhibits no discharge. Left eye exhibits no discharge. No scleral icterus.  Neck: Normal range of motion. Neck supple. No JVD present. No tracheal deviation present. No thyromegaly present.  Cardiovascular: Normal rate, regular rhythm and normal heart sounds. Exam reveals no gallop.  Pulmonary/Chest: Effort normal and breath sounds normal.  Abdominal: Bowel sounds are normal.  Musculoskeletal: She exhibits no edema.  Lymphadenopathy:    She has no cervical adenopathy.  Neurological: She is alert and oriented to person, place, and time.  Skin: Skin is warm and dry. She is not diaphoretic.  Psychiatric: She has a normal mood and affect. Her behavior is normal.    Lab Results  Component Value Date   WBC 6.0 06/16/2018   HGB 9.5 (L) 06/16/2018   HCT 32.6 (L) 06/16/2018   PLT 342 06/16/2018   GLUCOSE 100 (H) 06/16/2018   CHOL 221 (H) 06/16/2018   TRIG 82 06/16/2018   HDL 48 06/16/2018   LDLCALC 157 (H) 06/16/2018   ALT 13 06/16/2018   AST 15 06/16/2018   NA 140 06/16/2018   K 3.3 (L) 06/16/2018   CL 100 06/16/2018   CREATININE 0.80 06/16/2018   BUN 15 06/16/2018   CO2 25 06/16/2018   TSH 3.100 06/16/2018   INR 1.1 (H) 02/20/2014   HGBA1C 5.9 01/05/2013   MICROALBUR 5.1 04/30/2018    No results found.  Assessment & Plan:   Leslie [name] was seen today for establish care and annual exam.  Diagnoses and all orders for this visit:  Cardiomegaly -     CBC -     Comprehensive metabolic panel; Future -      Iron, TIBC and Ferritin Panel -     Lipid panel; Future -     TSH  Essential hypertension -     Cancel: CBC -     Cancel: Comprehensive metabolic panel -     Urinalysis, Routine w reflex microscopic -     Microalbumin / creatinine urine ratio -     CBC -     Comprehensive metabolic panel; Future -     Iron, TIBC and Ferritin Panel -     Lipid panel; Future -     TSH  Other iron deficiency anemia -     Cancel: CBC -     Cancel: Iron, TIBC and Ferritin Panel -     Cancel: TSH -     CBC -     Comprehensive  metabolic panel; Future -     Iron, TIBC and Ferritin Panel -     Lipid panel; Future -     TSH -     ferrous sulfate 325 (65 FE) MG EC tablet; Take 1 tablet (325 mg total) by mouth 3 (three) times daily with meals.  Exertional dyspnea -     Cancel: CBC  Elevated LDL cholesterol level -     Cancel: Comprehensive metabolic panel -     Cancel: Lipid panel -     Comprehensive metabolic panel; Future -     Iron, TIBC and Ferritin Panel -     Lipid panel; Future -     TSH  Other orders -     Urine Culture   I have discontinued Ermine M. Greenawalt's levalbuterol, levocetirizine, benzonatate, ipratropium, amLODipine, losartan-hydrochlorothiazide, and metoprolol succinate. I am also having her start on ferrous sulfate. Additionally, I am having her maintain her IRON PO, labetalol, cetirizine, albuterol, and valsartan-hydrochlorothiazide.  Meds ordered this encounter  Medications  . ferrous sulfate 325 (65 FE) MG EC tablet    Sig: Take 1 tablet (325 mg total) by mouth 3 (three) times daily with meals.    Dispense:  90 tablet    Refill:  1   Health maintenance labs were drawn for her today.  Follow-up of her iron deficiency anemia and elevated LDL cholesterol.  She is 4 hours fasting today.  Cardiology is managing her blood pressure cardiomegaly and diastolic vagal failure.  She is planning follow-up there soon.  She will see Dr. Leo Grosser for Pap smear.  Encouraged her to lose  weight and gave her information about the DASH diet. Follow-up: Return in about 3 months (around 07/31/2018).  [name], MD

## 2018-04-30 NOTE — Addendum Note (Signed)
Addended by: Diona Foley on: 04/30/2018 05:02 PM   Modules accepted: Orders

## 2018-04-30 NOTE — Patient Instructions (Signed)
DASH Eating Plan DASH stands for "Dietary Approaches to Stop Hypertension." The DASH eating plan is a healthy eating plan that has been shown to reduce high blood pressure (hypertension). It may also reduce your risk for type 2 diabetes, heart disease, and stroke. The DASH eating plan may also help with weight loss. What are tips for following this plan? General guidelines  Avoid eating more than 2,300 mg (milligrams) of salt (sodium) a day. If you have hypertension, you may need to reduce your sodium intake to 1,500 mg a day.  Limit alcohol intake to no more than 1 drink a day for nonpregnant women and 2 drinks a day for men. One drink equals 12 oz of beer, 5 oz of wine, or 1 oz of hard liquor.  Work with your health care provider to maintain a healthy body weight or to lose weight. Ask what an ideal weight is for you.  Get at least 30 minutes of exercise that causes your heart to beat faster (aerobic exercise) most days of the week. Activities may include walking, swimming, or biking.  Work with your health care provider or diet and nutrition specialist (dietitian) to adjust your eating plan to your individual calorie needs. Reading food labels  Check food labels for the amount of sodium per serving. Choose foods with less than 5 percent of the Daily Value of sodium. Generally, foods with less than 300 mg of sodium per serving fit into this eating plan.  To find whole grains, look for the word "whole" as the first word in the ingredient list. Shopping  Buy products labeled as "low-sodium" or "no salt added."  Buy fresh foods. Avoid canned foods and premade or frozen meals. Cooking  Avoid adding salt when cooking. Use salt-free seasonings or herbs instead of table salt or sea salt. Check with your health care provider or pharmacist before using salt substitutes.  Do not fry foods. Cook foods using healthy methods such as baking, boiling, grilling, and broiling instead.  Cook with  heart-healthy oils, such as olive, canola, soybean, or sunflower oil. Meal planning   Eat a balanced diet that includes: ? 5 or more servings of fruits and vegetables each day. At each meal, try to fill half of your plate with fruits and vegetables. ? Up to 6-8 servings of whole grains each day. ? Less than 6 oz of lean meat, poultry, or fish each day. A 3-oz serving of meat is about the same size as a deck of cards. One egg equals 1 oz. ? 2 servings of low-fat dairy each day. ? A serving of nuts, seeds, or beans 5 times each week. ? Heart-healthy fats. Healthy fats called Omega-3 fatty acids are found in foods such as flaxseeds and coldwater fish, like sardines, salmon, and mackerel.  Limit how much you eat of the following: ? Canned or prepackaged foods. ? Food that is high in trans fat, such as fried foods. ? Food that is high in saturated fat, such as fatty meat. ? Sweets, desserts, sugary drinks, and other foods with added sugar. ? Full-fat dairy products.  Do not salt foods before eating.  Try to eat at least 2 vegetarian meals each week.  Eat more home-cooked food and less restaurant, buffet, and fast food.  When eating at a restaurant, ask that your food be prepared with less salt or no salt, if possible. What foods are recommended? The items listed may not be a complete list. Talk with your dietitian about what   dietary choices are best for you. Grains Whole-grain or whole-wheat bread. Whole-grain or whole-wheat pasta. Brown rice. Oatmeal. Quinoa. Bulgur. Whole-grain and low-sodium cereals. Pita bread. Low-fat, low-sodium crackers. Whole-wheat flour tortillas. Vegetables Fresh or frozen vegetables (raw, steamed, roasted, or grilled). Low-sodium or reduced-sodium tomato and vegetable juice. Low-sodium or reduced-sodium tomato sauce and tomato paste. Low-sodium or reduced-sodium canned vegetables. Fruits All fresh, dried, or frozen fruit. Canned fruit in natural juice (without  added sugar). Meat and other protein foods Skinless chicken or turkey. Ground chicken or turkey. Pork with fat trimmed off. Fish and seafood. Egg whites. Dried beans, peas, or lentils. Unsalted nuts, nut butters, and seeds. Unsalted canned beans. Lean cuts of beef with fat trimmed off. Low-sodium, lean deli meat. Dairy Low-fat (1%) or fat-free (skim) milk. Fat-free, low-fat, or reduced-fat cheeses. Nonfat, low-sodium ricotta or cottage cheese. Low-fat or nonfat yogurt. Low-fat, low-sodium cheese. Fats and oils Soft margarine without trans fats. Vegetable oil. Low-fat, reduced-fat, or light mayonnaise and salad dressings (reduced-sodium). Canola, safflower, olive, soybean, and sunflower oils. Avocado. Seasoning and other foods Herbs. Spices. Seasoning mixes without salt. Unsalted popcorn and pretzels. Fat-free sweets. What foods are not recommended? The items listed may not be a complete list. Talk with your dietitian about what dietary choices are best for you. Grains Baked goods made with fat, such as croissants, muffins, or some breads. Dry pasta or rice meal packs. Vegetables Creamed or fried vegetables. Vegetables in a cheese sauce. Regular canned vegetables (not low-sodium or reduced-sodium). Regular canned tomato sauce and paste (not low-sodium or reduced-sodium). Regular tomato and vegetable juice (not low-sodium or reduced-sodium). Pickles. Olives. Fruits Canned fruit in a light or heavy syrup. Fried fruit. Fruit in cream or butter sauce. Meat and other protein foods Fatty cuts of meat. Ribs. Fried meat. Bacon. Sausage. Bologna and other processed lunch meats. Salami. Fatback. Hotdogs. Bratwurst. Salted nuts and seeds. Canned beans with added salt. Canned or smoked fish. Whole eggs or egg yolks. Chicken or turkey with skin. Dairy Whole or 2% milk, cream, and half-and-half. Whole or full-fat cream cheese. Whole-fat or sweetened yogurt. Full-fat cheese. Nondairy creamers. Whipped toppings.  Processed cheese and cheese spreads. Fats and oils Butter. Stick margarine. Lard. Shortening. Ghee. Bacon fat. Tropical oils, such as coconut, palm kernel, or palm oil. Seasoning and other foods Salted popcorn and pretzels. Onion salt, garlic salt, seasoned salt, table salt, and sea salt. Worcestershire sauce. Tartar sauce. Barbecue sauce. Teriyaki sauce. Soy sauce, including reduced-sodium. Steak sauce. Canned and packaged gravies. Fish sauce. Oyster sauce. Cocktail sauce. Horseradish that you find on the shelf. Ketchup. Mustard. Meat flavorings and tenderizers. Bouillon cubes. Hot sauce and Tabasco sauce. Premade or packaged marinades. Premade or packaged taco seasonings. Relishes. Regular salad dressings. Where to find more information:  National Heart, Lung, and Blood Institute: www.nhlbi.nih.gov  American Heart Association: www.heart.org Summary  The DASH eating plan is a healthy eating plan that has been shown to reduce high blood pressure (hypertension). It may also reduce your risk for type 2 diabetes, heart disease, and stroke.  With the DASH eating plan, you should limit salt (sodium) intake to 2,300 mg a day. If you have hypertension, you may need to reduce your sodium intake to 1,500 mg a day.  When on the DASH eating plan, aim to eat more fresh fruits and vegetables, whole grains, lean proteins, low-fat dairy, and heart-healthy fats.  Work with your health care provider or diet and nutrition specialist (dietitian) to adjust your eating plan to your individual   calorie needs. This information is not intended to replace advice given to you by your health care provider. Make sure you discuss any questions you have with your health care provider. Document Released: 11/20/2011 Document Revised: 11/24/2016 Document Reviewed: 11/24/2016 Elsevier Interactive Patient Education  2018 Elsevier Inc.  

## 2018-05-01 LAB — URINALYSIS, ROUTINE W REFLEX MICROSCOPIC
Bilirubin Urine: NEGATIVE
GLUCOSE, UA: NEGATIVE
Hgb urine dipstick: NEGATIVE
Ketones, ur: NEGATIVE
LEUKOCYTES UA: NEGATIVE
Nitrite: NEGATIVE
PH: 6 (ref 5.0–8.0)
Protein, ur: NEGATIVE
Specific Gravity, Urine: 1.029 (ref 1.001–1.03)

## 2018-05-01 LAB — URINE CULTURE
MICRO NUMBER: 90603775
SPECIMEN QUALITY:: ADEQUATE

## 2018-05-01 LAB — MICROALBUMIN / CREATININE URINE RATIO
Creatinine, Urine: 215 mg/dL (ref 20–275)
MICROALB/CREAT RATIO: 24 ug/mg{creat} (ref <30)
Microalb, Ur: 5.1 mg/dL

## 2018-05-18 ENCOUNTER — Ambulatory Visit: Payer: BLUE CROSS/BLUE SHIELD | Admitting: Nurse Practitioner

## 2018-05-18 ENCOUNTER — Encounter

## 2018-05-22 ENCOUNTER — Encounter (HOSPITAL_COMMUNITY): Payer: Self-pay

## 2018-05-22 ENCOUNTER — Ambulatory Visit (HOSPITAL_COMMUNITY)
Admission: EM | Admit: 2018-05-22 | Discharge: 2018-05-22 | Disposition: A | Discharge status: Home / Self Care | Payer: BLUE CROSS/BLUE SHIELD | Attending: Internal Medicine | Admitting: Internal Medicine

## 2018-05-22 DIAGNOSIS — S40862A Insect bite (nonvenomous) of left upper arm, initial encounter: Secondary | ICD-10-CM

## 2018-05-22 DIAGNOSIS — W57XXXA Bitten or stung by nonvenomous insect and other nonvenomous arthropods, initial encounter: Secondary | ICD-10-CM

## 2018-05-22 MED ORDER — DOXYCYCLINE HYCLATE 100 MG PO CAPS
200.0000 mg | ORAL_CAPSULE | Freq: Every day | ORAL | 0 refills | Status: DC
Start: 2018-05-22 — End: 2018-07-06

## 2018-05-22 NOTE — Discharge Instructions (Addendum)
Prescribed single prophylactic dose of doxycycline 200 mg To prevent tick bites, wear long sleeves, long pants, and light colors. Use insect repellent. Follow the instructions on the bottle. If the tick is biting, do not try to remove it with heat, alcohol, petroleum jelly, or fingernail polish. Use tweezers, curved forceps, or a tick-removal tool to grasp the tick. Gently pull up until the tick lets go. Do not twist or jerk the tick. Do not squeeze or crush the tick. Return here or go to ER if you have any new or worsening symptoms (rash, nausea, vomiting, fever, chills, headache, fatigue 
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
## 2018-05-22 NOTE — ED Triage Notes (Signed)
Pt presents with complaints of having a tick bite.

## 2018-05-25 DIAGNOSIS — I10 Essential (primary) hypertension: Secondary | ICD-10-CM | POA: Diagnosis not present

## 2018-05-26 LAB — BASIC METABOLIC PANEL
BUN / CREAT RATIO: 18 (ref 9–23)
BUN: 16 mg/dL (ref 6–24)
CALCIUM: 9.6 mg/dL (ref 8.7–10.2)
CHLORIDE: 99 mmol/L (ref 96–106)
CO2: 23 mmol/L (ref 20–29)
Creatinine, Ser: 0.88 mg/dL (ref 0.57–1.00)
GFR calc Af Amer: 93 mL/min/{1.73_m2} (ref >59)
GFR calc non Af Amer: 81 mL/min/{1.73_m2} (ref >59)
GLUCOSE: 98 mg/dL (ref 65–99)
Potassium: 3.6 mmol/L (ref 3.5–5.2)
Sodium: 140 mmol/L (ref 134–144)

## 2018-05-31 DIAGNOSIS — I5030 Unspecified diastolic (congestive) heart failure: Secondary | ICD-10-CM | POA: Diagnosis not present

## 2018-05-31 DIAGNOSIS — I10 Essential (primary) hypertension: Secondary | ICD-10-CM | POA: Diagnosis not present

## 2018-05-31 DIAGNOSIS — E785 Hyperlipidemia, unspecified: Secondary | ICD-10-CM | POA: Diagnosis not present

## 2018-06-16 ENCOUNTER — Other Ambulatory Visit: Payer: Self-pay | Admitting: Family Medicine

## 2018-06-16 DIAGNOSIS — I517 Cardiomegaly: Secondary | ICD-10-CM | POA: Diagnosis not present

## 2018-06-16 DIAGNOSIS — D508 Other iron deficiency anemias: Secondary | ICD-10-CM | POA: Diagnosis not present

## 2018-06-16 DIAGNOSIS — I10 Essential (primary) hypertension: Secondary | ICD-10-CM | POA: Diagnosis not present

## 2018-06-17 LAB — COMPREHENSIVE METABOLIC PANEL
A/G RATIO: 1.5 (ref 1.2–2.2)
ALBUMIN: 4.4 g/dL (ref 3.5–5.5)
ALT: 13 IU/L (ref 0–32)
AST: 15 IU/L (ref 0–40)
Alkaline Phosphatase: 64 IU/L (ref 39–117)
BUN/Creatinine Ratio: 19 (ref 9–23)
BUN: 15 mg/dL (ref 6–24)
CO2: 25 mmol/L (ref 20–29)
Calcium: 9.4 mg/dL (ref 8.7–10.2)
Chloride: 100 mmol/L (ref 96–106)
Creatinine, Ser: 0.8 mg/dL (ref 0.57–1.00)
GFR calc Af Amer: 104 mL/min/{1.73_m2} (ref >59)
GFR calc non Af Amer: 91 mL/min/{1.73_m2} (ref >59)
GLOBULIN, TOTAL: 2.9 g/dL (ref 1.5–4.5)
GLUCOSE: 100 mg/dL — AB (ref 65–99)
Potassium: 3.3 mmol/L — ABNORMAL LOW (ref 3.5–5.2)
SODIUM: 140 mmol/L (ref 134–144)
TOTAL PROTEIN: 7.3 g/dL (ref 6.0–8.5)

## 2018-06-17 LAB — LIPID PANEL
CHOLESTEROL TOTAL: 221 mg/dL — AB (ref 100–199)
Chol/HDL Ratio: 4.6 ratio — ABNORMAL HIGH (ref 0.0–4.4)
HDL: 48 mg/dL (ref >39)
LDL CALC: 157 mg/dL — AB (ref 0–99)
Triglycerides: 82 mg/dL (ref 0–149)
VLDL Cholesterol Cal: 16 mg/dL (ref 5–40)

## 2018-06-17 LAB — CBC WITH DIFFERENTIAL/PLATELET
BASOS: 0 %
Basophils Absolute: 0 10*3/uL (ref 0.0–0.2)
EOS (ABSOLUTE): 0.1 10*3/uL (ref 0.0–0.4)
EOS: 1 %
HEMATOCRIT: 32.6 % — AB (ref 34.0–46.6)
HEMOGLOBIN: 9.5 g/dL — AB (ref 11.1–15.9)
IMMATURE GRANS (ABS): 0 10*3/uL (ref 0.0–0.1)
Immature Granulocytes: 0 %
LYMPHS: 36 %
Lymphocytes Absolute: 2.2 10*3/uL (ref 0.7–3.1)
MCH: 23.5 pg — AB (ref 26.6–33.0)
MCHC: 29.1 g/dL — ABNORMAL LOW (ref 31.5–35.7)
MCV: 81 fL (ref 79–97)
MONOCYTES: 8 %
Monocytes Absolute: 0.5 10*3/uL (ref 0.1–0.9)
NEUTROS ABS: 3.2 10*3/uL (ref 1.4–7.0)
Neutrophils: 55 %
Platelets: 342 10*3/uL (ref 150–450)
RBC: 4.04 x10E6/uL (ref 3.77–5.28)
RDW: 18.2 % — ABNORMAL HIGH (ref 12.3–15.4)
WBC: 6 10*3/uL (ref 3.4–10.8)

## 2018-06-17 LAB — TSH: TSH: 3.1 u[IU]/mL (ref 0.450–4.500)

## 2018-06-17 LAB — IRON AND TIBC
IRON SATURATION: 6 % — AB (ref 15–55)
IRON: 25 ug/dL — AB (ref 27–159)
Total Iron Binding Capacity: 394 ug/dL (ref 250–450)
UIBC: 369 ug/dL (ref 131–425)

## 2018-06-21 ENCOUNTER — Encounter: Payer: Self-pay | Admitting: Family Medicine

## 2018-06-22 ENCOUNTER — Other Ambulatory Visit: Payer: Self-pay

## 2018-06-22 ENCOUNTER — Other Ambulatory Visit: Payer: Self-pay | Admitting: Family Medicine

## 2018-06-22 DIAGNOSIS — E876 Hypokalemia: Secondary | ICD-10-CM

## 2018-06-22 MED ORDER — FERROUS SULFATE 325 (65 FE) MG PO TBEC: 325.0000 mg | DELAYED_RELEASE_TABLET | Freq: Three times a day (TID) | ORAL | 1 refills | Status: DC

## 2018-06-22 NOTE — Addendum Note (Signed)
Addended by: Jon Billings on: 06/22/2018 04:34 PM   Modules accepted: Orders

## 2018-06-23 ENCOUNTER — Telehealth: Payer: Self-pay

## 2018-06-23 LAB — POTASSIUM: Potassium: 4 mmol/L (ref 3.5–5.2)

## 2018-06-23 NOTE — Telephone Encounter (Signed)
Dr. Ethelene Hal - I'm still waiting for a response from Mrs. Juncaj, but it looks like she's taken Alprazolam, Lorazepam, and Paxil in the past.

## 2018-06-24 NOTE — Telephone Encounter (Signed)
Patient is currently taking Atomoxine hcl 25 tablets.

## 2018-06-24 NOTE — Telephone Encounter (Signed)
paxil 12.5mg  daily #30. 2 rf.

## 2018-06-25 ENCOUNTER — Telehealth: Payer: Self-pay

## 2018-06-25 NOTE — Telephone Encounter (Signed)
Transfer okay?    Copied from [facility]. Topic: General - Other >> Jun 25, 2018  4:29 PM Carolyn Stare wrote:  Pt at [facility] asking if she can TOC to [facility]

## 2018-06-28 NOTE — Telephone Encounter (Signed)
Transfer approved by Dr. Ethelene Hal.

## 2018-06-28 NOTE — Telephone Encounter (Signed)
Okay 

## 2018-06-28 NOTE — Telephone Encounter (Signed)
LVM to inform patient we can set up an appointment with Jodi Mourning.   Please set up a transfer care appointment with Valere Dross when patient returns call. Thank you.

## 2018-06-30 DIAGNOSIS — I5030 Unspecified diastolic (congestive) heart failure: Secondary | ICD-10-CM | POA: Diagnosis not present

## 2018-06-30 DIAGNOSIS — I10 Essential (primary) hypertension: Secondary | ICD-10-CM | POA: Diagnosis not present

## 2018-06-30 DIAGNOSIS — E785 Hyperlipidemia, unspecified: Secondary | ICD-10-CM | POA: Diagnosis not present

## 2018-07-06 ENCOUNTER — Encounter: Payer: Self-pay | Admitting: Family Medicine

## 2018-07-06 ENCOUNTER — Ambulatory Visit: Payer: BLUE CROSS/BLUE SHIELD | Admitting: Family Medicine

## 2018-07-06 VITALS — BP 128/80 | HR 82 | Ht 70.0 in | Wt 214.2 lb

## 2018-07-06 DIAGNOSIS — F419 Anxiety disorder, unspecified: Secondary | ICD-10-CM

## 2018-07-06 DIAGNOSIS — Z9119 Patient's noncompliance with other medical treatment and regimen: Secondary | ICD-10-CM | POA: Diagnosis not present

## 2018-07-06 DIAGNOSIS — Z91199 Patient's noncompliance with other medical treatment and regimen due to unspecified reason: Secondary | ICD-10-CM | POA: Insufficient documentation

## 2018-07-06 DIAGNOSIS — D508 Other iron deficiency anemias: Secondary | ICD-10-CM | POA: Diagnosis not present

## 2018-07-06 MED ORDER — PAROXETINE HCL ER 12.5 MG PO TB24: 12.5000 mg | ORAL_TABLET | Freq: Every day | ORAL | 2 refills | Status: DC

## 2018-07-06 NOTE — Progress Notes (Signed)
Subjective:  Patient ID: Leslie [name], female    DOB: [birth year]  Age: 42 y.o. MRN: [id number]  CC: Anxiety   HPI Leslie [name] presents for evaluation and treatment of anxiety.  She is getting ready to go back to school and [place].  This is a stressful situation for her.  She is seeking treatment for anxiety denies any issue with depression.  She has taken Paxil in the past with good results.  She admits to only intermittent use of her iron pills.  Cardiology is managing her hypertension and cardiomegaly.  According to her she was just placed on amlodipine and continues to take Normodyne and valsartan with hydrochlorothiazide.  Outpatient Medications Prior to Visit  Medication Sig Dispense Refill  . albuterol (PROVENTIL HFA;VENTOLIN HFA) 108 (90 Base) MCG/ACT inhaler Inhale 1-2 puffs into the lungs every 6 (six) hours as needed for wheezing or shortness of breath.    . cetirizine (ZYRTEC) 10 MG tablet TAKE 1 TABLET BY MOUTH EVERY DAY 30 tablet 0  . ferrous sulfate 325 (65 FE) MG EC tablet Take 1 tablet (325 mg total) by mouth 3 (three) times daily with meals. 90 tablet 1  . IRON PO Take 1 tablet by mouth every morning. Reported on 12/31/2015    . labetalol (NORMODYNE) 300 MG tablet Take 1 tablet (300 mg total) 2 (two) times daily by mouth. 180 tablet 3  . valsartan-hydrochlorothiazide (DIOVAN-HCT) 320-25 MG tablet Take 1 tablet by mouth daily.    Marland Kitchen losartan-hydrochlorothiazide (HYZAAR) 100-25 MG tablet Take 1 tablet by mouth daily.  3  . doxycycline (VIBRAMYCIN) 100 MG capsule Take 2 capsules (200 mg total) by mouth daily. 2 capsule 0   No facility-administered medications prior to visit.     ROS Review of Systems  Constitutional: Negative for chills, fatigue, fever and unexpected weight change.  Respiratory: Negative.   Cardiovascular: Negative.   Gastrointestinal: Negative.  Negative for anal bleeding and blood in stool.  Genitourinary: Negative for hematuria.  Skin:  Negative.   Allergic/Immunologic: Negative for immunocompromised state.  Hematological: Does not bruise/bleed easily.  Psychiatric/Behavioral: Negative for behavioral problems, dysphoric mood, self-injury and suicidal ideas. The patient is nervous/anxious.     Objective:  BP 128/80   Pulse 82   Ht 5\' 10"  (1.778 m)   Wt 214 lb 4 oz (97.2 kg)   SpO2 99%   BMI 30.74 kg/m   BP Readings from Last 3 Encounters:  07/06/18 128/80  05/22/18 (!) 161/94  04/30/18 (!) 144/100    Wt Readings from Last 3 Encounters:  07/06/18 214 lb 4 oz (97.2 kg)  04/30/18 218 lb 8 oz (99.1 kg)  02/11/18 214 lb (97.1 kg)    Physical Exam  Constitutional: She is oriented to person, place, and time. She appears well-developed and well-nourished. No distress.  HENT:  Head: Normocephalic and atraumatic.  Right Ear: External ear normal.  Left Ear: External ear normal.  Eyes: Right eye exhibits no discharge. Left eye exhibits no discharge. No scleral icterus.  Pulmonary/Chest: Effort normal.  Neurological: She is alert and oriented to person, place, and time.  Skin: She is not diaphoretic.  Psychiatric: She has a normal mood and affect. Her behavior is normal.    Lab Results  Component Value Date   WBC 6.0 06/16/2018   HGB 9.5 (L) 06/16/2018   HCT 32.6 (L) 06/16/2018   PLT 342 06/16/2018   GLUCOSE 100 (H) 06/16/2018   CHOL 221 (H) 06/16/2018   TRIG 82  06/16/2018   HDL 48 06/16/2018   LDLCALC 157 (H) 06/16/2018   ALT 13 06/16/2018   AST 15 06/16/2018   NA 140 06/16/2018   K 4.0 06/22/2018   CL 100 06/16/2018   CREATININE 0.80 06/16/2018   BUN 15 06/16/2018   CO2 25 06/16/2018   TSH 3.100 06/16/2018   INR 1.1 (H) 02/20/2014   HGBA1C 5.9 01/05/2013   MICROALBUR 5.1 04/30/2018    No results found.  Assessment & Plan:   Leslie [name] was seen today for anxiety.  Diagnoses and all orders for this visit:  Other iron deficiency anemia  Anxiety -     PARoxetine (PAXIL-CR) 12.5 MG 24 hr  tablet; Take 1 tablet (12.5 mg total) by mouth daily.  Noncompliance   I have discontinued Leslie [name]'s doxycycline and losartan-hydrochlorothiazide. I am also having her start on PARoxetine. Additionally, I am having her maintain her IRON PO, labetalol, cetirizine, albuterol, valsartan-hydrochlorothiazide, and ferrous sulfate.  Meds ordered this encounter  Medications  . PARoxetine (PAXIL-CR) 12.5 MG 24 hr tablet    Sig: Take 1 tablet (12.5 mg total) by mouth daily.    Dispense:  30 tablet    Refill:  2   Stressed the importance of compliance with iron.  We will restart her on low-dose Paxil.  She will take this at night and it will help her sleep.  She will follow-up in 1 to 2 months and we will recheck her blood count and see how she is doing with her anxiety.  Offered her referral for counseling and she has declined that at this time.  She will follow-up with Dr. Sharlet Salina for treatment of her blood pressure and cardiomegaly.  Follow-up: Return in about 2 months (around 09/06/2018).  [name], MD

## 2018-07-07 NOTE — Addendum Note (Signed)
Addended by: Kateri Mc E on: 07/07/2018 11:39 AM   Modules accepted: Orders

## 2018-07-28 ENCOUNTER — Other Ambulatory Visit: Payer: Self-pay | Admitting: Family Medicine

## 2018-07-28 DIAGNOSIS — F419 Anxiety disorder, unspecified: Secondary | ICD-10-CM

## 2018-09-10 ENCOUNTER — Ambulatory Visit: Payer: BLUE CROSS/BLUE SHIELD | Admitting: Family Medicine

## 2018-09-10 ENCOUNTER — Encounter: Payer: Self-pay | Admitting: Family Medicine

## 2018-09-10 VITALS — BP 128/80 | HR 84 | Ht 70.0 in

## 2018-09-10 DIAGNOSIS — D508 Other iron deficiency anemias: Secondary | ICD-10-CM

## 2018-09-10 DIAGNOSIS — F419 Anxiety disorder, unspecified: Secondary | ICD-10-CM

## 2018-09-10 DIAGNOSIS — Z23 Encounter for immunization: Secondary | ICD-10-CM | POA: Insufficient documentation

## 2018-09-10 MED ORDER — PAROXETINE HCL ER 12.5 MG PO TB24: 12.5000 mg | ORAL_TABLET | Freq: Every day | ORAL | 1 refills | Status: DC

## 2018-09-10 NOTE — Progress Notes (Addendum)
Subjective:  Patient ID: Leslie [name], female    DOB: [birth year]  Age: 42 y.o. MRN: [id number]  CC: Follow-up   HPI Leslie [name] presents for follow-up of her anxiety.  She feels as though the Paxil is helping a great deal.  Also helping her sleep.  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  She takes her iron pills intermittently.  Refuses flu vaccine.  However she is interested in receiving the pneumonia vaccines.  Outpatient Medications Prior to Visit  Medication Sig Dispense Refill  . albuterol (PROVENTIL HFA;VENTOLIN HFA) 108 (90 Base) MCG/ACT inhaler Inhale 1-2 puffs into the lungs every 6 (six) hours as needed for wheezing or shortness of breath.    Marland Kitchen amLODipine (NORVASC) 5 MG tablet Take 5 mg by mouth daily.    . cetirizine (ZYRTEC) 10 MG tablet TAKE 1 TABLET BY MOUTH EVERY DAY 30 tablet 0  . ferrous sulfate 325 (65 FE) MG EC tablet Take 1 tablet (325 mg total) by mouth 3 (three) times daily with meals. 90 tablet 1  . labetalol (NORMODYNE) 300 MG tablet Take 1 tablet (300 mg total) 2 (two) times daily by mouth. 180 tablet 3  . losartan-hydrochlorothiazide (HYZAAR) 100-25 MG tablet Take 1 tablet by mouth daily.    . potassium chloride SA (K-DUR,KLOR-CON) 20 MEQ tablet Take 20 mEq by mouth 2 (two) times daily.    Marland Kitchen PARoxetine (PAXIL-CR) 12.5 MG 24 hr tablet TAKE 1 TABLET BY MOUTH EVERY DAY 90 tablet 0   No facility-administered medications prior to visit.     ROS Review of Systems  Constitutional: Negative.   HENT: Negative.   Respiratory: Negative.   Cardiovascular: Negative.   Gastrointestinal: Negative.   Hematological: Negative.   Psychiatric/Behavioral: Negative for confusion, self-injury and suicidal ideas.    Objective:  BP 128/80   Pulse 84   Ht 5\' 10"  (1.778 m)   SpO2 100%   BMI 30.74 kg/m   BP Readings from Last 3 Encounters:  09/10/18 128/80  07/06/18 128/80  05/22/18 (!) 161/94    Wt Readings  from Last 3 Encounters:  07/06/18 214 lb 4 oz (97.2 kg)  04/30/18 218 lb 8 oz (99.1 kg)  02/11/18 214 lb (97.1 kg)    Physical Exam  Constitutional: She is oriented to person, place, and time. She appears well-developed and well-nourished. No distress.  HENT:  Head: Normocephalic and atraumatic.  Right Ear: External ear normal.  Left Ear: External ear normal.  Eyes: Right eye exhibits no discharge. Left eye exhibits no discharge. No scleral icterus.  Pulmonary/Chest: Effort normal.  Neurological: She is alert and oriented to person, place, and time.  Skin: Skin is warm and dry. She is not diaphoretic.  Psychiatric: She has a normal mood and affect. Her behavior is normal.    Lab Results  Component Value Date   WBC 6.0 06/16/2018   HGB 9.5 (L) 06/16/2018   HCT 32.6 (L) 06/16/2018   PLT 342 06/16/2018   GLUCOSE 100 (H) 06/16/2018   CHOL 221 (H) 06/16/2018   TRIG 82 06/16/2018   HDL 48 06/16/2018   LDLCALC 157 (H) 06/16/2018   ALT 13 06/16/2018   AST 15 06/16/2018   NA 140 06/16/2018   K 4.0 06/22/2018   CL 100 06/16/2018   CREATININE 0.80 06/16/2018   BUN 15 06/16/2018   CO2 25 06/16/2018   TSH 3.100 06/16/2018   INR 1.1 (H) 02/20/2014  HGBA1C 5.9 01/05/2013   MICROALBUR 5.1 04/30/2018    No results found.  Assessment & Plan:   Leslie [name] was seen today for follow-up.  Diagnoses and all orders for this visit:  Anxiety -     PARoxetine (PAXIL-CR) 12.5 MG 24 hr tablet; Take 1 tablet (12.5 mg total) by mouth daily.  Need for vaccination against Streptococcus pneumoniae using pneumococcal conjugate vaccine 13 -     Pneumococcal polysaccharide vaccine 23-valent greater than or equal to 2yo subcutaneous/IM  Other iron deficiency anemia -     Cancel: CBC -     Cancel: Iron, TIBC and Ferritin Panel -     Cancel: CBC -     Cancel: CBC -     Iron, TIBC and Ferritin Panel; Future -     CBC; Future -     ferrous sulfate 325 (65 FE) MG tablet; Take 1 tablet (325 mg  total) by mouth 3 (three) times daily with meals.   I have changed Leslie [name]. Leslie [name]'s PARoxetine. I am also having her start on ferrous sulfate. Additionally, I am having her maintain her labetalol, cetirizine, albuterol, ferrous sulfate, losartan-hydrochlorothiazide, potassium chloride SA, and amLODipine.  Meds ordered this encounter  Medications  . PARoxetine (PAXIL-CR) 12.5 MG 24 hr tablet    Sig: Take 1 tablet (12.5 mg total) by mouth daily.    Dispense:  90 tablet    Refill:  1  . ferrous sulfate 325 (65 FE) MG tablet    Sig: Take 1 tablet (325 mg total) by mouth 3 (three) times daily with meals.    Dispense:  90 tablet    Refill:  3   Discussed the importance of yearly flu vaccines and patient declines.  However she did agree to start the pneumonia vaccine series was given the pneumococcal 23 today.  Encouraged her to exercise.  Urged compliance with daily iron intake.  Checking CBC iron studies today.  Follow-up: Return in about 3 months (around 12/10/2018).  [name], MD

## 2018-09-10 NOTE — Addendum Note (Signed)
Addended by: Diona Foley on: 09/10/2018 04:47 PM   Modules accepted: Orders

## 2018-09-17 ENCOUNTER — Telehealth: Payer: Self-pay | Admitting: Cardiology

## 2018-09-17 NOTE — Telephone Encounter (Signed)
New Message:    Patient is calling about having her blood work done

## 2018-09-17 NOTE — Telephone Encounter (Signed)
Returned call to pt she states that she went to her other doctor [name] and the phlebotomist was not there to draw her blood. Can she come here for her blood draw. Informed pt that this is ok. Labs printed for [facility] here

## 2018-09-23 ENCOUNTER — Other Ambulatory Visit: Payer: Self-pay | Admitting: Family Medicine

## 2018-09-23 DIAGNOSIS — D508 Other iron deficiency anemias: Secondary | ICD-10-CM | POA: Diagnosis not present

## 2018-09-24 ENCOUNTER — Telehealth: Payer: Self-pay | Admitting: Family Medicine

## 2018-09-24 LAB — IRON,TIBC AND FERRITIN PANEL
Ferritin: 11 ng/mL — ABNORMAL LOW (ref 15–150)
IRON: 21 ug/dL — AB (ref 27–159)
Iron Saturation: 5 % — CL (ref 15–55)
TIBC: 392 ug/dL (ref 250–450)
UIBC: 371 ug/dL (ref 131–425)

## 2018-09-24 NOTE — Telephone Encounter (Signed)
Copied from [place] [id number]. Topic: Quick Communication - Lab Results (Clinic Use ONLY) >> Sep 24, 2018  2:56 PM Self, Rande Brunt, CMA wrote: Called patient to inform them of her lab results. When patient returns call, triage nurse may disclose results. >> Sep 24, 2018  3:58 PM Wynetta Emery, [place] C wrote: Pt is returning call for lab results.

## 2018-09-27 MED ORDER — FERROUS SULFATE 325 (65 FE) MG PO TABS: 325.0000 mg | ORAL_TABLET | Freq: Three times a day (TID) | ORAL | 3 refills | Status: DC

## 2018-09-27 NOTE — Addendum Note (Signed)
Addended by: Jon Billings on: 09/27/2018 01:52 PM   Modules accepted: Orders

## 2018-10-05 DIAGNOSIS — M545 Low back pain: Secondary | ICD-10-CM | POA: Diagnosis not present

## 2018-10-05 DIAGNOSIS — M1712 Unilateral primary osteoarthritis, left knee: Secondary | ICD-10-CM | POA: Diagnosis not present

## 2018-10-06 DIAGNOSIS — I5030 Unspecified diastolic (congestive) heart failure: Secondary | ICD-10-CM | POA: Diagnosis not present

## 2018-10-06 DIAGNOSIS — E785 Hyperlipidemia, unspecified: Secondary | ICD-10-CM | POA: Diagnosis not present

## 2018-10-06 DIAGNOSIS — E669 Obesity, unspecified: Secondary | ICD-10-CM | POA: Diagnosis not present

## 2018-10-06 DIAGNOSIS — I10 Essential (primary) hypertension: Secondary | ICD-10-CM | POA: Diagnosis not present

## 2018-11-03 ENCOUNTER — Ambulatory Visit: Payer: BLUE CROSS/BLUE SHIELD | Admitting: Family

## 2018-11-04 ENCOUNTER — Ambulatory Visit: Payer: BLUE CROSS/BLUE SHIELD | Admitting: Family Medicine

## 2018-11-04 DIAGNOSIS — J029 Acute pharyngitis, unspecified: Secondary | ICD-10-CM | POA: Diagnosis not present

## 2018-11-04 DIAGNOSIS — N970 Female infertility associated with anovulation: Secondary | ICD-10-CM | POA: Insufficient documentation

## 2018-11-04 DIAGNOSIS — D573 Sickle-cell trait: Secondary | ICD-10-CM | POA: Insufficient documentation

## 2018-11-05 ENCOUNTER — Ambulatory Visit: Payer: BLUE CROSS/BLUE SHIELD | Admitting: Family Medicine

## 2018-11-05 ENCOUNTER — Encounter: Payer: Self-pay | Admitting: Family Medicine

## 2018-11-05 VITALS — BP 132/80 | HR 89 | Temp 97.5°F | Ht 70.0 in | Wt 218.5 lb

## 2018-11-05 DIAGNOSIS — J40 Bronchitis, not specified as acute or chronic: Secondary | ICD-10-CM

## 2018-11-05 MED ORDER — AMOXICILLIN 500 MG PO CAPS: 500.0000 mg | ORAL_CAPSULE | Freq: Three times a day (TID) | ORAL | 0 refills | Status: DC

## 2018-11-05 NOTE — Patient Instructions (Signed)
Antibiotic Medicine, Adult  Antibiotic medicines treat infections caused by a type of germ called bacteria. They work by killing the bacteria that make you sick.  When do I need to take antibiotics?  You often need these medicines to treat bacterial infections, such as:  · A urinary tract infection (UTI).  · Strep throat.  · Meningitis. This affects the spinal cord and brain.  · A bad lung infection.    You may start the medicines while your doctor waits for tests to come back. When the tests come back, your doctor may change or stop your medicine.  When are antibiotics not needed?  You do not need these medicines for most common illnesses, such as:  · A cold.  · The flu.  · A sore throat.    Antibiotics are not always needed for all infections caused by bacteria. Do not ask for these medicines, or take them, when they are not needed.  What are the risks of taking antibiotics?  Most antibiotics can cause an infection called Clostridium difficile.This causes watery poop (diarrhea). Let your doctor know right away if:  · You have watery poop while taking an antibiotic.  · You have watery poop after you stop taking an antibiotic. The illness can happen weeks after you stop the medicine.    You also have a risk of getting an infection in the future that antibiotics cannot treat (antibiotic-resistant infection). This type of infection can be dangerous.  What else should I know about taking antibiotics?  · You need to take the entire prescription.  ? Take the medicine for as long as told by your doctor.  ? Do not stop taking it even if you start to feel better.  · Try not to miss any doses. If you miss a dose, call your doctor.  · Birth control pills may not work. If you take birth control pills:  ? Keep on taking them.  ? Use a second form of birth control, such as a condom. Do this for as long as told by your doctor.  · Ask your doctor:  ? How long to wait in between doses.  ? If you should take the medicine with  food.  ? If there is anything you should stay away from while taking the antibiotic, such as:  ? Food.  ? Drinks.  ? Medicines.  ? If there are any side effects you should watch for.  · Only take the medicines that your doctor told you to take. Do not take medicines that were given to someone else.  · Drink a large glass of water with the medicine.  · Ask the pharmacist for a tool to measure the medicine, such as:  ? A syringe.  ? A cup.  ? A spoon.  · Throw away any extra medicine.  Contact a doctor if:  · You get worse.  · You have new joint pain or muscle aches after starting the medicine.  · You have side effects from the medicine, such as:  ? Stomach pain.  ? Watery poop.  ? Feeling sick to your stomach (nausea).  Get help right away if:  · You have signs of a very bad allergic reaction. If this happens, stop taking the medicine right away. Signs may include:  ? Hives. These are raised, itchy, red bumps on the skin.  ? Skin rash.  ? Trouble breathing.  ? Wheezing.  ? Swelling.  ? Feeling dizzy.  ? Throwing up (  vomiting).  · Your pee (urine) is dark, or is the color of blood.  · Your skin turns yellow.  · You bruise easily.  · You bleed easily.  · You have very bad watery poop and cramps in your belly.  · You have a very bad headache.  Summary  · Antibiotics are often used to treat infections caused by bacteria.  · Only take these medicines when needed.  · Let your doctor know if you have watery poop while taking an antibiotic.  · You need to take the entire prescription.  This information is not intended to replace advice given to you by your health care provider. Make sure you discuss any questions you have with your health care provider.  Document Released: 09/09/2008 Document Revised: 12/03/2016 Document Reviewed: 12/03/2016  Elsevier Interactive Patient Education © 2017 Elsevier Inc.
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
## 2018-11-25 ENCOUNTER — Other Ambulatory Visit: Payer: Self-pay | Admitting: Cardiology

## 2018-11-25 DIAGNOSIS — I10 Essential (primary) hypertension: Secondary | ICD-10-CM

## 2018-11-26 NOTE — Telephone Encounter (Signed)
Rx request sent to pharmacy.  

## 2018-12-03 ENCOUNTER — Ambulatory Visit: Payer: BLUE CROSS/BLUE SHIELD | Admitting: Family Medicine

## 2018-12-03 DIAGNOSIS — E785 Hyperlipidemia, unspecified: Secondary | ICD-10-CM | POA: Diagnosis not present

## 2018-12-03 DIAGNOSIS — E669 Obesity, unspecified: Secondary | ICD-10-CM | POA: Diagnosis not present

## 2018-12-03 DIAGNOSIS — I10 Essential (primary) hypertension: Secondary | ICD-10-CM | POA: Diagnosis not present

## 2018-12-03 DIAGNOSIS — I5030 Unspecified diastolic (congestive) heart failure: Secondary | ICD-10-CM | POA: Diagnosis not present

## 2018-12-16 DIAGNOSIS — I10 Essential (primary) hypertension: Secondary | ICD-10-CM | POA: Diagnosis not present

## 2018-12-16 DIAGNOSIS — I5030 Unspecified diastolic (congestive) heart failure: Secondary | ICD-10-CM | POA: Diagnosis not present

## 2018-12-16 DIAGNOSIS — E669 Obesity, unspecified: Secondary | ICD-10-CM | POA: Diagnosis not present

## 2018-12-16 DIAGNOSIS — E785 Hyperlipidemia, unspecified: Secondary | ICD-10-CM | POA: Diagnosis not present

## 2019-01-13 ENCOUNTER — Encounter: Payer: Self-pay | Admitting: Family Medicine

## 2019-01-13 DIAGNOSIS — F419 Anxiety disorder, unspecified: Secondary | ICD-10-CM

## 2019-01-14 MED ORDER — PAROXETINE HCL ER 25 MG PO TB24: 25.0000 mg | ORAL_TABLET | Freq: Every day | ORAL | 1 refills | Status: DC

## 2019-01-21 ENCOUNTER — Encounter: Payer: Self-pay | Admitting: Family Medicine

## 2019-01-26 ENCOUNTER — Encounter: Payer: Self-pay | Admitting: Family Medicine

## 2019-01-26 ENCOUNTER — Ambulatory Visit: Payer: BLUE CROSS/BLUE SHIELD | Admitting: Family Medicine

## 2019-01-26 VITALS — BP 130/80 | HR 86 | Temp 98.8°F | Ht 70.0 in | Wt 217.4 lb

## 2019-01-26 DIAGNOSIS — J029 Acute pharyngitis, unspecified: Secondary | ICD-10-CM | POA: Insufficient documentation

## 2019-01-26 DIAGNOSIS — R509 Fever, unspecified: Secondary | ICD-10-CM | POA: Diagnosis not present

## 2019-01-26 LAB — POC INFLUENZA A&B (BINAX/QUICKVUE)
Influenza A, POC: NEGATIVE
Influenza B, POC: NEGATIVE

## 2019-01-26 LAB — POCT RAPID STREP A (OFFICE): Rapid Strep A Screen: NEGATIVE

## 2019-01-26 MED ORDER — BENZONATATE 200 MG PO CAPS: 200.0000 mg | ORAL_CAPSULE | Freq: Two times a day (BID) | ORAL | 0 refills | Status: DC | PRN

## 2019-01-26 MED ORDER — PENICILLIN V POTASSIUM 500 MG PO TABS: 500.0000 mg | ORAL_TABLET | Freq: Three times a day (TID) | ORAL | 0 refills | Status: AC
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
## 2019-02-03 ENCOUNTER — Other Ambulatory Visit: Payer: Self-pay

## 2019-02-03 ENCOUNTER — Other Ambulatory Visit: Payer: BLUE CROSS/BLUE SHIELD

## 2019-02-03 ENCOUNTER — Ambulatory Visit: Payer: BLUE CROSS/BLUE SHIELD | Admitting: Family Medicine

## 2019-02-03 ENCOUNTER — Telehealth: Payer: Self-pay | Admitting: Family Medicine

## 2019-02-03 DIAGNOSIS — I517 Cardiomegaly: Secondary | ICD-10-CM | POA: Diagnosis not present

## 2019-02-03 DIAGNOSIS — D509 Iron deficiency anemia, unspecified: Secondary | ICD-10-CM | POA: Diagnosis not present

## 2019-02-03 DIAGNOSIS — D508 Other iron deficiency anemias: Secondary | ICD-10-CM

## 2019-02-03 DIAGNOSIS — E876 Hypokalemia: Secondary | ICD-10-CM

## 2019-02-03 DIAGNOSIS — I10 Essential (primary) hypertension: Secondary | ICD-10-CM

## 2019-02-03 DIAGNOSIS — E7800 Pure hypercholesterolemia, unspecified: Secondary | ICD-10-CM

## 2019-02-03 DIAGNOSIS — M5136 Other intervertebral disc degeneration, lumbar region: Secondary | ICD-10-CM

## 2019-02-03 MED ORDER — MELOXICAM 15 MG PO TABS: 15.0000 mg | ORAL_TABLET | Freq: Every day | ORAL | 0 refills | Status: DC | PRN

## 2019-02-03 NOTE — Progress Notes (Signed)
iron

## 2019-02-03 NOTE — Addendum Note (Signed)
Addended by: Lynnea Ferrier on: 02/03/2019 03:49 PM   Modules accepted: Orders

## 2019-02-03 NOTE — Addendum Note (Signed)
Addended by: Jon Billings on: 02/03/2019 04:58 PM   Modules accepted: Orders

## 2019-02-03 NOTE — Telephone Encounter (Signed)
Patient wanted to no if she could get a refill of her meloxicam

## 2019-02-03 NOTE — Addendum Note (Signed)
Addended by: Lynnea Ferrier on: 02/03/2019 04:16 PM   Modules accepted: Orders

## 2019-02-04 LAB — BASIC METABOLIC PANEL
BUN/Creatinine Ratio: 18 (ref 9–23)
BUN: 14 mg/dL (ref 6–24)
CO2: 20 mmol/L (ref 20–29)
Calcium: 9.1 mg/dL (ref 8.7–10.2)
Chloride: 105 mmol/L (ref 96–106)
Creatinine, Ser: 0.77 mg/dL (ref 0.57–1.00)
GFR calc Af Amer: 109 mL/min/{1.73_m2} (ref >59)
GFR calc non Af Amer: 95 mL/min/{1.73_m2} (ref >59)
GLUCOSE: 93 mg/dL (ref 65–99)
Potassium: 4.2 mmol/L (ref 3.5–5.2)
Sodium: 141 mmol/L (ref 134–144)

## 2019-02-04 LAB — LDL CHOLESTEROL, DIRECT: LDL Direct: 155 mg/dL — ABNORMAL HIGH (ref 0–99)

## 2019-02-04 LAB — IRON,TIBC AND FERRITIN PANEL
Ferritin: 13 ng/mL — ABNORMAL LOW (ref 15–150)
IRON: 23 ug/dL — AB (ref 27–159)
Iron Saturation: 6 % — CL (ref 15–55)
Total Iron Binding Capacity: 367 ug/dL (ref 250–450)
UIBC: 344 ug/dL (ref 131–425)

## 2019-02-04 NOTE — Addendum Note (Signed)
Addended by: Abelino Derrick A on: 02/04/2019 11:13 AM   Modules accepted: Orders

## 2019-02-07 ENCOUNTER — Other Ambulatory Visit: Payer: Self-pay | Admitting: Family Medicine

## 2019-02-07 DIAGNOSIS — F419 Anxiety disorder, unspecified: Secondary | ICD-10-CM

## 2019-02-11 ENCOUNTER — Ambulatory Visit: Payer: BLUE CROSS/BLUE SHIELD | Admitting: Family Medicine

## 2019-02-28 ENCOUNTER — Other Ambulatory Visit: Payer: Self-pay | Admitting: Family Medicine

## 2019-02-28 DIAGNOSIS — M5136 Other intervertebral disc degeneration, lumbar region: Secondary | ICD-10-CM

## 2019-03-10 ENCOUNTER — Ambulatory Visit: Payer: Self-pay | Admitting: Cardiology

## 2019-04-07 ENCOUNTER — Telehealth: Payer: Self-pay | Admitting: Family Medicine

## 2019-04-07 NOTE — Telephone Encounter (Signed)
Called and could not leave vm. Calling to schedule virtual visit with Leslie [name].

## 2019-04-14 ENCOUNTER — Telehealth: Payer: Self-pay | Admitting: Cardiology

## 2019-04-14 NOTE — Telephone Encounter (Signed)
Called x3 for pre reg/LVM °

## 2019-04-15 ENCOUNTER — Telehealth (INDEPENDENT_AMBULATORY_CARE_PROVIDER_SITE_OTHER): Payer: BLUE CROSS/BLUE SHIELD | Admitting: Cardiology

## 2019-04-15 ENCOUNTER — Telehealth: Payer: Self-pay

## 2019-04-15 ENCOUNTER — Encounter: Payer: Self-pay | Admitting: Cardiology

## 2019-04-15 VITALS — Ht 70.0 in | Wt 200.0 lb

## 2019-04-15 DIAGNOSIS — D509 Iron deficiency anemia, unspecified: Secondary | ICD-10-CM

## 2019-04-15 DIAGNOSIS — E7800 Pure hypercholesterolemia, unspecified: Secondary | ICD-10-CM | POA: Diagnosis not present

## 2019-04-15 DIAGNOSIS — F419 Anxiety disorder, unspecified: Secondary | ICD-10-CM

## 2019-04-15 DIAGNOSIS — I119 Hypertensive heart disease without heart failure: Secondary | ICD-10-CM

## 2019-04-15 DIAGNOSIS — I10 Essential (primary) hypertension: Secondary | ICD-10-CM

## 2019-04-15 MED ORDER — LABETALOL HCL 300 MG PO TABS: 150.0000 mg | ORAL_TABLET | Freq: Two times a day (BID) | ORAL | 1 refills | Status: DC

## 2019-04-15 NOTE — Patient Instructions (Addendum)
Medication Instructions:  DECREASE Labetalol to 150mg  Take 1 tablet twice a day INCREASE Diovan-Hydrochlorothiazide  take 1 whole tablet once a day or you can try half a tablet twice If you need a refill on your cardiac medications before your next appointment, please call your pharmacy.   Lab work: Your physician recommends that you return for lab work in: 1 week BMET( DO [place]) If you have labs (blood work) drawn today and your tests are completely normal, you will receive your results only by: Marland Kitchen MyChart Message (if you have MyChart) OR . A paper copy in the mail If you have any lab test that is abnormal or we need to change your treatment, we will call you to review the results.  Testing/Procedures: NONE   Follow-Up: At [facility], you and your health needs are our priority.  As part of our continuing mission to provide you with exceptional heart care, we have created designated Provider Care Teams.  These Care Teams include your primary Cardiologist (physician) and Advanced Practice Providers (APPs -  Physician Assistants and Nurse Practitioners) who all work together to provide you with the care you need, when you need it. You will need a follow up appointment in 3 months. You may see Kirk Ruths, MD or one of the following Advanced Practice Providers on your designated Care Team:   Kerin Ransom, PA-C Roby Lofts, [place] . Sande Rives, PA-C  Your physician recommends that you schedule a follow-up appointment in: [place] VISIT.  Any Other Special Instructions Will Be Listed Below (If Applicable).

## 2019-04-15 NOTE — Telephone Encounter (Signed)
Virtual Visit Pre-Appointment Phone Call  "Leslie [name], I am calling you today to discuss your upcoming appointment. We are currently trying to limit exposure to the virus that causes COVID-19 by seeing patients at home rather than in the office."  1. "What is the BEST phone number to call the day of the visit?" - include this in appointment notes  2. "Do you have or have access to (through a family member/friend) a smartphone with video capability that we can use for your visit?" a. If yes - list this number in appt notes as "cell" (if different from BEST phone #) and list the appointment type as a VIDEO visit in appointment notes b. If no - list the appointment type as a PHONE visit in appointment notes  3. Confirm consent - "In the setting of the current Covid19 crisis, you are scheduled for a VIDEO visit with your provider on 04/15/2019 at [facility].  Just as we do with many in-office visits, in order for you to participate in this visit, we must obtain consent.  If you'd like, I can send this to your mychart (if signed up) or email for you to review.  Otherwise, I can obtain your verbal consent now.  All virtual visits are billed to your insurance company just like a normal visit would be.  By agreeing to a virtual visit, we'd like you to understand that the technology does not allow for your provider to perform an examination, and thus may limit your provider's ability to fully assess your condition. If your provider identifies any concerns that need to be evaluated in person, we will make arrangements to do so.  Finally, though the technology is pretty good, we cannot assure that it will always work on either your or our end, and in the setting of a video visit, we may have to convert it to a phone-only visit.  In either situation, we cannot ensure that we have a secure connection.  Are you willing to proceed?" STAFF: Did the patient verbally acknowledge consent to telehealth visit? Document YES/NO  here: YES  4. Advise patient to be prepared - "Two hours prior to your appointment, go ahead and check your blood pressure, pulse, oxygen saturation, and your weight (if you have the equipment to check those) and write them all down. When your visit starts, your provider will ask you for this information. If you have an Apple Watch or Kardia device, please plan to have heart rate information ready on the day of your appointment. Please have a pen and paper handy nearby the day of the visit as well."  5. Give patient instructions for MyChart download to smartphone OR Doximity/Doxy.me as below if video visit (depending on what platform provider is using)  6. Inform patient they will receive a phone call 15 minutes prior to their appointment time (may be from unknown caller ID) so they should be prepared to answer    TELEPHONE CALL NOTE  Leslie [name] has been deemed a candidate for a follow-up tele-health visit to limit community exposure during the Covid-19 pandemic. I spoke with the patient via phone to ensure availability of phone/video source, confirm preferred email & phone number, and discuss instructions and expectations.  I reminded Leslie [name] to be prepared with any vital sign and/or heart rhythm information that could potentially be obtained via home monitoring, at the time of her visit. I reminded Leslie [name] to expect a phone call prior to her  visit.  Harold Hedge, CMA 04/15/2019 8:21 AM   INSTRUCTIONS FOR DOWNLOADING THE MYCHART APP TO SMARTPHONE  - The patient must first make sure to have activated MyChart and know their login information - If Apple, go to [employer] and type in MyChart in the search bar and download the app. If Android, ask patient to go to [employer] and type in [place] in the search bar and download the app. The app is free but as with any other app downloads, their phone may require them to verify saved payment information or Apple/Android  password.  - The patient will need to then log into the app with their MyChart username and password, and select [facility] as their healthcare provider to link the account. When it is time for your visit, go to the MyChart app, find appointments, and click Begin Video Visit. Be sure to Select Allow for your device to access the Microphone and Camera for your visit. You will then be connected, and your provider will be with you shortly.  **If they have any issues connecting, or need assistance please contact MyChart service desk [phone])**  **If using a computer, in order to ensure the best quality for their visit they will need to use either of the following Internet Browsers: [employer], or Google Chrome**  IF USING DOXIMITY or DOXY.ME - The patient will receive a link just prior to their visit by text.     FULL LENGTH CONSENT FOR TELE-HEALTH VISIT   I hereby voluntarily request, consent and authorize [facility] and its employed or contracted physicians, physician assistants, nurse practitioners or other licensed health care professionals (the Practitioner), to provide me with telemedicine health care services (the "Services") as deemed necessary by the treating Practitioner. I acknowledge and consent to receive the Services by the Practitioner via telemedicine. I understand that the telemedicine visit will involve communicating with the Practitioner through live audiovisual communication technology and the disclosure of certain medical information by electronic transmission. I acknowledge that I have been given the opportunity to request an in-person assessment or other available alternative prior to the telemedicine visit and am voluntarily participating in the telemedicine visit.  I understand that I have the right to withhold or withdraw my consent to the use of telemedicine in the course of my care at any time, without affecting my right to future care or treatment,  and that the Practitioner or I may terminate the telemedicine visit at any time. I understand that I have the right to inspect all information obtained and/or recorded in the course of the telemedicine visit and may receive copies of available information for a reasonable fee.  I understand that some of the potential risks of receiving the Services via telemedicine include:  Marland Kitchen Delay or interruption in medical evaluation due to technological equipment failure or disruption; . Information transmitted may not be sufficient (e.g. poor resolution of images) to allow for appropriate medical decision making by the Practitioner; and/or  . In rare instances, security protocols could fail, causing a breach of personal health information.  Furthermore, I acknowledge that it is my responsibility to provide information about my medical history, conditions and care that is complete and accurate to the best of my ability. I acknowledge that Practitioner's advice, recommendations, and/or decision may be based on factors not within their control, such as incomplete or inaccurate data provided by me or distortions of diagnostic images or specimens that may result from electronic transmissions. I  understand that the practice of medicine is not an exact science and that Practitioner makes no warranties or guarantees regarding treatment outcomes. I acknowledge that I will receive a copy of this consent concurrently upon execution via email to the email address I last provided but may also request a printed copy by calling the office of [facility].    I understand that my insurance will be billed for this visit.   I have read or had this consent read to me. . I understand the contents of this consent, which adequately explains the benefits and risks of the Services being provided via telemedicine.  . I have been provided ample opportunity to ask questions regarding this consent and the Services and have had my questions  answered to my satisfaction. . I give my informed consent for the services to be provided through the use of telemedicine in my medical care  By participating in this telemedicine visit I agree to the above.
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
## 2019-04-15 NOTE — Telephone Encounter (Signed)
Contacted patient to discuss AVS instructions. Patient notified and voiced understanding. 

## 2019-04-20 ENCOUNTER — Telehealth: Payer: Self-pay | Admitting: Pharmacist

## 2019-04-20 NOTE — Telephone Encounter (Signed)
Talked to Leslie [name] today.  She is not checking her BP at home and is very busy at work.    Unable to provide prefer date or time for telephone follow up. After some discussion, patient agreed on call this Friday afternoon.  Scheduled for HTN clinic telephone f/u at 2pm on Friday 04/22/2019.

## 2019-04-22 ENCOUNTER — Telehealth: Payer: Self-pay | Admitting: Pharmacist

## 2019-04-22 NOTE — Telephone Encounter (Signed)
New Message   Pt is calling because she said she had an appt scheduled about her BP   Please call

## 2019-04-25 ENCOUNTER — Encounter: Payer: Self-pay | Admitting: Pharmacist Clinician (PhC)/ Clinical Pharmacy Specialist

## 2019-04-25 NOTE — Telephone Encounter (Signed)
Patient was asked to follow with CVRR after telehealth visit on May 1 with Kerin Ransom PA. She has a history of uncontrolled hypertension (both systolic and diastolic) as well as switching physicians and medication non-compliance.    Reached out to patient this morning, spent 25 minutes with her on the phone.  She has been checking her pressure on occasion, and has 6 readings since the first of May.  Her average BP was 146/109, however the range was 114-166/96-113.  One reading was listed as 114/111, so I am not sure about the accuracy of her meter or her testing technique.    Her current meds for hypertension include amlodipine 10 mg, valsartan hct 320/25 mg, and labetolol 300 mg bid.  She has not taken any of these medications with any consistency over the past several weeks.  Most days she takes the labetalol only once in the evenings, and some days she may take twice daily, either 1/2 or whole tablet, depending on how she feels.  She notes that she cannot take a whole valsartan hct 320/25 because it causes her to have chest pain.  So she cuts it to half tablet and takes that most nights.  Her PCP (?) gave her a prescription for amlodipine 10 mg.  She has not taken this consistently as it causes a pain in her toes as well as foot cramping and drowsiness, so will occasionally take 1/2 tablet.    We had a long conversation that was often sidetracked.  She feels that the amlodipine sometimes makes her drowsy, as does the labetalol, then will tell me she has insomnia until 3-4 am.  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  She has meloxicam for the foot pain, but then tells me it's probably arthritis, so she just doesn't bother with the anti-inflammatory.    When asked about exercise, she states she has not done any, although she takes the dog outside once or twice a  day.  She would rather not exercise outside because of the radiation.  She has a treadmill at home, but has not used it in some time.  Notes that Dr. Aundra Dubin told her not to exercise strenuously due to risk of heart failure.  Her diet is mostly home cooked foods, admits to using sea salt when cooking, but no added salt at the table.  Denies eating prepared/boxed foods.  Also admits not getting any fruits or vegetables in her died since school closed - she was eating fruit cocktail regularly at the school.  When asked about her snacks, she usually munches on snickers bars (with almonds) as well as almond M&M's and Doritos.  I pointed out that the doritos would be high in salt, but she noted that she hasn't opened the bag yet.    Had a long discussion of the need to take medications consistently for at least 2-3 weeks to determine where her BP really is.  She prefers to take meds only once daily at night.  Suggested that she take valsartan hct 160/12.5, amlodipine 5 (both 1/2 tablets) and labetolol 300 mg every night.  Stressed that she has to take them EVERY NIGHT, so we can determine the true effect of these medications.   Suggested that she try magnesium once daily to help decrease risk of foot cramps - she notes she  bought a bottle last year so will start on that and then start the amlodipine in 2-3 days.    Will send her a list of instructions for taking home BP readings as well as a summary of how to take her meds, in MyChart.  Asked that she check her pressure daily, then in 2 weeks send [place] a list of the readings.  Once we receive those, will call to arrange a follow up phone visit.  Patient agreeable to plan.

## 2019-05-10 NOTE — Telephone Encounter (Signed)
LMOM for patient to return call.  BP readings have improved greatly, average of past 9 days was 128/96 (down from 146/109 on May 5).

## 2019-05-17 ENCOUNTER — Telehealth: Payer: Self-pay | Admitting: Pharmacist Clinician (PhC)/ Clinical Pharmacy Specialist

## 2019-05-17 NOTE — Telephone Encounter (Signed)
LMOM to call back regarding home BP readings

## 2019-05-23 NOTE — Telephone Encounter (Signed)
F/U Message           Patient is returning Kristin's call, she would like a call back.

## 2019-05-24 ENCOUNTER — Telehealth: Payer: Self-pay | Admitting: Pharmacist Clinician (PhC)/ Clinical Pharmacy Specialist

## 2019-05-24 NOTE — Telephone Encounter (Signed)
Patient took meds as follows:  Amlodipine 10 mg - 1/2 tab hs   labetalol 300 mg - 1 tab hs  Valsartan/hct 320/25 - 1/2 tab hs  Her home BP readings for two week period in which she took all her meds showed (with 9 readings), average 128/96.  This is much improved from her last average in May (6 readings) at 146/109.  Patient gives the credit for this drop to eating fresh chopped garlic daily.    Since sending in her readings, she has stopped taking the amlodipine daily, as it occasionally causes her to have some edema.  She does admit that when she takes it, she sees much improvement in her BP.    I asked her about increasing the valsartan hct 320/25 to a whole tablet daily or adding a dose of labetalol in the mornings.  She is not crazy about either idea, stating that morning meds just make her drowsy.  She also mentions that she has insomnia and hasn't been sleeping well, with everything going on in the world.    She finally agrees to add 1/2 tab labetalol (150 mg) in the mornings and continue regular BP checks for then next 2-3 weeks.

## 2019-06-08 ENCOUNTER — Other Ambulatory Visit: Payer: Self-pay | Admitting: Pediatric Intensive Care

## 2019-06-08 DIAGNOSIS — Z20822 Contact with and (suspected) exposure to covid-19: Secondary | ICD-10-CM

## 2019-06-12 LAB — NOVEL CORONAVIRUS, NAA: SARS-CoV-2, NAA: NOT DETECTED

## 2019-06-22 ENCOUNTER — Other Ambulatory Visit: Payer: Self-pay | Admitting: Critical Care Medicine

## 2019-06-22 DIAGNOSIS — Z20822 Contact with and (suspected) exposure to covid-19: Secondary | ICD-10-CM

## 2019-06-22 DIAGNOSIS — R6889 Other general symptoms and signs: Secondary | ICD-10-CM | POA: Diagnosis not present

## 2019-06-27 LAB — NOVEL CORONAVIRUS, NAA: SARS-CoV-2, NAA: NOT DETECTED

## 2019-07-01 MED ORDER — VALSARTAN-HYDROCHLOROTHIAZIDE 320-25 MG PO TABS: 0.5000 | ORAL_TABLET | Freq: Every day | ORAL | 3 refills | Status: DC
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
## 2019-07-20 ENCOUNTER — Encounter: Payer: Self-pay | Admitting: Cardiology

## 2019-07-20 ENCOUNTER — Other Ambulatory Visit: Payer: Self-pay

## 2019-07-20 ENCOUNTER — Ambulatory Visit: Payer: BC Managed Care – PPO | Admitting: Cardiology

## 2019-07-20 VITALS — BP 140/100 | HR 85 | Temp 98.2°F | Ht 70.0 in | Wt 212.0 lb

## 2019-07-20 DIAGNOSIS — Z9119 Patient's noncompliance with other medical treatment and regimen: Secondary | ICD-10-CM

## 2019-07-20 DIAGNOSIS — I10 Essential (primary) hypertension: Secondary | ICD-10-CM

## 2019-07-20 DIAGNOSIS — Z91199 Patient's noncompliance with other medical treatment and regimen due to unspecified reason: Secondary | ICD-10-CM

## 2019-07-20 MED ORDER — AMLODIPINE BESYLATE 5 MG PO TABS: 5.0000 mg | ORAL_TABLET | Freq: Every day | ORAL | 3 refills | Status: DC

## 2019-07-20 MED ORDER — AMLODIPINE BESYLATE 10 MG PO TABS: 10.0000 mg | ORAL_TABLET | Freq: Every day | ORAL | 3 refills | Status: DC

## 2019-07-20 MED ORDER — VALSARTAN-HYDROCHLOROTHIAZIDE 320-25 MG PO TABS: 0.5000 | ORAL_TABLET | Freq: Every day | ORAL | 3 refills | Status: DC

## 2019-07-20 NOTE — Patient Instructions (Signed)
Medication Instructions:  NO CHANGE If you need a refill on your cardiac medications before your next appointment, please call your pharmacy.   Lab work: Your physician recommends that you HAVE LAB WORK TODAY If you have labs (blood work) drawn today and your tests are completely normal, you will receive your results only by: . MyChart Message (if you have MyChart) OR . A paper copy in the mail If you have any lab test that is abnormal or we need to change your treatment, we will call you to review the results.  Follow-Up: At [facility], you and your health needs are our priority.  As part of our continuing mission to provide you with exceptional heart care, we have created designated Provider Care Teams.  These Care Teams include your primary Cardiologist (physician) and Advanced Practice Providers (APPs -  Physician Assistants and Nurse Practitioners) who all work together to provide you with the care you need, when you need it. You will need a follow up appointment in 6 months.  Please call our office 2 months in advance to schedule this appointment.  You may see Brian Crenshaw, MD or one of the following Advanced Practice Providers on your designated Care Team:   Luke Kilroy, PA-C Krista Kroeger, PA-C . Callie Goodrich, PA-C     

## 2019-07-21 LAB — BASIC METABOLIC PANEL
BUN/Creatinine Ratio: 22 (ref 9–23)
BUN: 17 mg/dL (ref 6–24)
CO2: 22 mmol/L (ref 20–29)
Calcium: 9.4 mg/dL (ref 8.7–10.2)
Chloride: 101 mmol/L (ref 96–106)
Creatinine, Ser: 0.79 mg/dL (ref 0.57–1.00)
GFR calc Af Amer: 105 mL/min/{1.73_m2} (ref >59)
GFR calc non Af Amer: 91 mL/min/{1.73_m2} (ref >59)
Glucose: 96 mg/dL (ref 65–99)
Potassium: 3.9 mmol/L (ref 3.5–5.2)
Sodium: 140 mmol/L (ref 134–144)
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
## 2019-10-04 ENCOUNTER — Encounter: Payer: Self-pay | Admitting: Physician Assistant

## 2019-10-04 ENCOUNTER — Telehealth (INDEPENDENT_AMBULATORY_CARE_PROVIDER_SITE_OTHER): Payer: BC Managed Care – PPO | Admitting: Physician Assistant

## 2019-10-04 ENCOUNTER — Telehealth: Payer: Self-pay | Admitting: Cardiology

## 2019-10-04 VITALS — BP 145/115 | HR 89 | Ht 70.0 in | Wt 215.0 lb

## 2019-10-04 DIAGNOSIS — R0789 Other chest pain: Secondary | ICD-10-CM

## 2019-10-04 DIAGNOSIS — Z9119 Patient's noncompliance with other medical treatment and regimen: Secondary | ICD-10-CM

## 2019-10-04 DIAGNOSIS — I10 Essential (primary) hypertension: Secondary | ICD-10-CM

## 2019-10-04 DIAGNOSIS — Z91199 Patient's noncompliance with other medical treatment and regimen due to unspecified reason: Secondary | ICD-10-CM

## 2019-10-04 MED ORDER — HYDRALAZINE HCL 50 MG PO TABS: 50.0000 mg | ORAL_TABLET | Freq: Two times a day (BID) | ORAL | 3 refills | Status: DC

## 2019-10-04 MED ORDER — VALSARTAN-HYDROCHLOROTHIAZIDE 320-25 MG PO TABS: 0.5000 | ORAL_TABLET | Freq: Every day | ORAL | 3 refills | Status: DC

## 2019-10-04 NOTE — Patient Instructions (Signed)
Medication Instructions:  STOP Labetalol  START Hydralazine 50 mg twice daily.  *If you need a refill on your cardiac medications before your next appointment, please call your pharmacy*  Follow-Up: At [facility], you and your health needs are our priority.  As part of our continuing mission to provide you with exceptional heart care, we have created designated Provider Care Teams.  These Care Teams include your primary Cardiologist (physician) and Advanced Practice Providers (APPs -  Physician Assistants and Nurse Practitioners) who all work together to provide you with the care you need, when you need it.  Your next appointment:   2-3 weeks  The format for your next appointment:   Either In Person or Virtual  Provider:   You may see Kirk Ruths, MD or one of the following Advanced Practice Providers on your designated Care Team:    Kerin Ransom, PA-C  [place], [place]  Coletta Memos, [place]

## 2019-10-04 NOTE — Telephone Encounter (Signed)
LVM for patient to call and schedule 2-3 week followup with Leslie [name] or [name].

## 2019-10-07 ENCOUNTER — Telehealth: Payer: Self-pay | Admitting: Cardiology

## 2019-10-07 NOTE — Telephone Encounter (Signed)
New message   Patient wants to know when order will be put in epic for Ct. Please advise.

## 2019-10-07 NOTE — Telephone Encounter (Signed)
Left message for pt to call.

## 2019-10-10 ENCOUNTER — Telehealth: Payer: Self-pay

## 2019-10-10 NOTE — Telephone Encounter (Signed)
LM2Cb per Fabian Sharp f/u appt.

## 2019-10-10 NOTE — Telephone Encounter (Signed)
Please call back and ask what her BP has been running. I suggested an ischemic evaluation for her atypical chest pain once her BP was better controlled on her antihypertensives. She has been noncompliant with her medications.  Our office also tried to reach her on 10/04/19 to make a follow up appt.   Please call back and get BP log and make her an appt this week. Needs to be in-person to take her BP.

## 2019-10-10 NOTE — Telephone Encounter (Signed)
Pt called asking about when she was going to have CT scheduled. Looked through pt's notes and do not see any recollection of that being ordered. Pt stated she was having chest pain on Saturday. 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 Asked pt if she was taking her prescribed medications and stated she "could not keep up with it and that it wouldn't happen overnight" Advised pt that if she needs to comply with her medication regimen because it is imperative to her health. Advised pt that if she has chest pain again and it does not subside to call 911. Told pt this message would be routed to her care-team.

## 2019-10-10 NOTE — Telephone Encounter (Signed)
LMTCB to get BP recordings, if she has been able to do that. Also asked pt to call back to schedule appointment for this Thursday with Angie, or sometime this week for office visit.

## 2019-10-10 NOTE — Telephone Encounter (Signed)
Spoke to pt about CT concerns. Refer to previous note.

## 2019-10-10 NOTE — Telephone Encounter (Signed)
Not sure what CT she is referring to [facility]

## 2019-10-10 NOTE — Telephone Encounter (Signed)
LMTCB

## 2019-10-10 NOTE — Telephone Encounter (Signed)
Patient calling back returning a call from our office. The patient is still at work and does not have her recent BP readings with her.

## 2019-10-11 ENCOUNTER — Telehealth: Payer: Self-pay

## 2019-10-11 NOTE — Telephone Encounter (Signed)
Confirmed appt 11/10 at 8am.

## 2019-10-13 ENCOUNTER — Telehealth: Payer: Self-pay | Admitting: General Practice

## 2019-10-13 NOTE — Telephone Encounter (Signed)
LM2CB-Left detailed message to call back to discuss and to please start taking.

## 2019-10-13 NOTE — Telephone Encounter (Signed)
This is not a common side effect of hydralazine.  She has only been on for < 10 days.  Ask her to take AM dose with breakfast and evening dose at bedtime.   She needs to understand that her pressure is poorly controlled and we need the medication to keep it lower.  If she is not willing to continue, she can try spironolactone 25 mg qd, but would need a BMET after 7-10 days from start.

## 2019-10-13 NOTE — Telephone Encounter (Signed)
° ° °  Pt c/o medication issue:  1. Name of Medication: hydrALAZINE   2. How are you currently taking this medication (dosage and times per day)? n/a  3. Are you having a reaction (difficulty breathing--STAT)? no  4. What is your medication issue?  Patient states she is no longer taking hydrALAZINE  Due to feeling shakey

## 2019-10-14 NOTE — Telephone Encounter (Signed)
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 Follow up scheduled next week.

## 2019-10-14 NOTE — Telephone Encounter (Signed)
Called and spoke with patient. States she is taking the hydralazine in the morning with breakfast and at night.  She cannot tolerate the the jitteriness.  She stopped it and took Labetalol 150 mg (0.5 tablet) last night as she did previously. I suggested the spironolactone and patient said she tried that years ago and had a reaction that she could not remember what it was.  We reviewed her allergies to lisinopril and amlodipine and reactions which were the meds listed on her chart. She said those were correct but she cannot take spironolactone either. She verbalized understanding that her BP is poorly controlled. She said she was working remotely and could not talk long.  Advised I would route to provider for further direction and advice.

## 2019-10-14 NOTE — Telephone Encounter (Signed)
I offered to double book her when I was in clinic yesterday as APPOD, but she declined the appointment. She has been noncompliant with medications. The chest pain she described to me at our last visit sounded atypical (i.e., worse when raising her arms and when placing her heavy book bag over her left shoulder). I advised that we need to better control her pressure prior to further evaluation. I have not had success in finding a medication regimen that she will comply with and tolerate. Please make her an appt with Dr. Stanford Breed. Alternatively, she may be a candidate for our new hypertension clinic with Dr. Oval Linsey.

## 2019-10-14 NOTE — Telephone Encounter (Signed)
Pt scheduled with PA next week. Per Fabian Sharp needs appt with Dr Stanford Breed.

## 2019-10-17 ENCOUNTER — Ambulatory Visit: Payer: BC Managed Care – PPO | Admitting: General Practice
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
## 2019-10-18 ENCOUNTER — Encounter: Payer: Self-pay | Admitting: Physician Assistant

## 2019-10-18 ENCOUNTER — Telehealth (INDEPENDENT_AMBULATORY_CARE_PROVIDER_SITE_OTHER): Payer: BC Managed Care – PPO | Admitting: Physician Assistant

## 2019-10-18 VITALS — BP 134/98 | Ht 70.0 in | Wt 215.0 lb

## 2019-10-18 DIAGNOSIS — R0789 Other chest pain: Secondary | ICD-10-CM | POA: Diagnosis not present

## 2019-10-18 DIAGNOSIS — I10 Essential (primary) hypertension: Secondary | ICD-10-CM | POA: Diagnosis not present

## 2019-10-18 NOTE — Patient Instructions (Signed)
Medication Instructions:  Your physician recommends that you continue on your current medications as directed. Please refer to the Current Medication list given to you today.  *If you need a refill on your cardiac medications before your next appointment, please call your pharmacy*   Follow-Up: At [facility], you and your health needs are our priority.  As part of our continuing mission to provide you with exceptional heart care, we have created designated Provider Care Teams.  These Care Teams include your primary Cardiologist (physician) and Advanced Practice Providers (APPs -  Physician Assistants and Nurse Practitioners) who all work together to provide you with the care you need, when you need it.  Your next appointment:   Wednesday, January 20th at 4:00 PM  The format for your next appointment:   In Person  Provider:   Dr. Stanford Breed

## 2019-10-25 ENCOUNTER — Ambulatory Visit: Payer: BC Managed Care – PPO | Admitting: General Practice

## 2019-11-14 DIAGNOSIS — Z03818 Encounter for observation for suspected exposure to other biological agents ruled out: Secondary | ICD-10-CM | POA: Diagnosis not present

## 2019-11-19 DIAGNOSIS — Z20828 Contact with and (suspected) exposure to other viral communicable diseases: Secondary | ICD-10-CM | POA: Diagnosis not present

## 2019-11-28 ENCOUNTER — Encounter: Payer: Self-pay | Admitting: Family Medicine

## 2019-11-28 ENCOUNTER — Telehealth (INDEPENDENT_AMBULATORY_CARE_PROVIDER_SITE_OTHER): Payer: BC Managed Care – PPO | Admitting: Family Medicine

## 2019-11-28 VITALS — BP 148/113 | HR 76 | Ht 70.0 in | Wt 213.0 lb

## 2019-11-28 DIAGNOSIS — J069 Acute upper respiratory infection, unspecified: Secondary | ICD-10-CM | POA: Insufficient documentation

## 2019-11-28 DIAGNOSIS — U071 COVID-19: Secondary | ICD-10-CM | POA: Insufficient documentation

## 2019-11-28 DIAGNOSIS — Z03818 Encounter for observation for suspected exposure to other biological agents ruled out: Secondary | ICD-10-CM | POA: Diagnosis not present

## 2019-11-28 DIAGNOSIS — J4521 Mild intermittent asthma with (acute) exacerbation: Secondary | ICD-10-CM | POA: Diagnosis not present

## 2019-11-28 DIAGNOSIS — Z20822 Contact with and (suspected) exposure to covid-19: Secondary | ICD-10-CM

## 2019-11-28 DIAGNOSIS — Z20828 Contact with and (suspected) exposure to other viral communicable diseases: Secondary | ICD-10-CM | POA: Diagnosis not present

## 2019-11-28 DIAGNOSIS — J45909 Unspecified asthma, uncomplicated: Secondary | ICD-10-CM | POA: Insufficient documentation

## 2019-11-28 MED ORDER — PREDNISONE 20 MG PO TABS: 20.0000 mg | ORAL_TABLET | Freq: Two times a day (BID) | ORAL | 0 refills | Status: AC

## 2019-11-28 NOTE — Telephone Encounter (Signed)
Pt has a mychart visit today. Closing this note
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
## 2019-11-28 NOTE — Telephone Encounter (Signed)
Pt has a virtual visit for today. Closing this note out this will be addressed during visit

## 2019-12-02 ENCOUNTER — Encounter: Payer: Self-pay | Admitting: Family Medicine

## 2019-12-19 ENCOUNTER — Other Ambulatory Visit: Payer: Self-pay

## 2019-12-19 ENCOUNTER — Ambulatory Visit (INDEPENDENT_AMBULATORY_CARE_PROVIDER_SITE_OTHER): Payer: BC Managed Care – PPO | Admitting: Family Medicine

## 2019-12-19 ENCOUNTER — Ambulatory Visit: Payer: Self-pay

## 2019-12-19 ENCOUNTER — Ambulatory Visit: Payer: BC Managed Care – PPO | Admitting: Family Medicine

## 2019-12-19 ENCOUNTER — Encounter: Payer: Self-pay | Admitting: Family Medicine

## 2019-12-19 VITALS — BP 152/100 | HR 108 | Temp 98.7°F | Ht 70.0 in | Wt 217.4 lb

## 2019-12-19 DIAGNOSIS — R0782 Intercostal pain: Secondary | ICD-10-CM | POA: Insufficient documentation

## 2019-12-19 DIAGNOSIS — D509 Iron deficiency anemia, unspecified: Secondary | ICD-10-CM

## 2019-12-19 DIAGNOSIS — R071 Chest pain on breathing: Secondary | ICD-10-CM | POA: Diagnosis not present

## 2019-12-19 DIAGNOSIS — R1011 Right upper quadrant pain: Secondary | ICD-10-CM | POA: Diagnosis not present

## 2019-12-19 NOTE — Patient Instructions (Signed)
Chest Wall Pain Chest wall pain is pain in or around the bones and muscles of your chest. Chest wall pain may be caused by:  An injury.  Coughing a lot.  Using your chest and arm muscles too much. Sometimes, the cause may not be known. This pain may take a few weeks or longer to get better. Follow these instructions at home: Managing pain, stiffness, and swelling If told, put ice on the painful area:  Put ice in a plastic bag.  Place a towel between your skin and the bag.  Leave the ice on for 20 minutes, 2-3 times a day.  Activity  Rest as told by your doctor.  Avoid doing things that cause pain. This includes lifting heavy items.  Ask your doctor what activities are safe for you. General instructions   Take over-the-counter and prescription medicines only as told by your doctor.  Do not use any products that contain nicotine or tobacco, such as cigarettes, e-cigarettes, and chewing tobacco. If you need help quitting, ask your doctor.  Keep all follow-up visits as told by your doctor. This is important. Contact a doctor if:  You have a fever.  Your chest pain gets worse.  You have new symptoms. Get help right away if:  You feel sick to your stomach (nauseous) or you throw up (vomit).  You feel sweaty or light-headed.  You have a cough with mucus from your lungs (sputum) or you cough up blood.  You are short of breath. These symptoms may be an emergency. Do not wait to see if the symptoms will go away. Get medical help right away. Call your local emergency services (911 in the [place] Do not drive yourself to the hospital. Summary  Chest wall pain is pain in or around the bones and muscles of your chest.  It may be treated with ice, rest, and medicines. Your condition may also get better if you avoid doing things that cause pain.  Contact a doctor if you have a fever, chest pain that gets worse, or new symptoms.  Get help right away if you feel light-headed  or you get short of breath. These symptoms may be an emergency. This information is not intended to replace advice given to you by your health care provider. Make sure you discuss any questions you have with your health care provider. Document Revised: 06/03/2018 Document Reviewed: 06/03/2018 Elsevier Patient Education  [place].  Chest Wall Pain Chest wall pain is pain in or around the bones and muscles of your chest. Sometimes, an injury causes this pain. Excessive coughing or overuse of arm and chest muscles may also cause chest wall pain. Sometimes, the cause may not be known. This pain may take several weeks or longer to get better. Follow these instructions at home: Managing pain, stiffness, and swelling   If directed, put ice on the painful area: ? Put ice in a plastic bag. ? Place a towel between your skin and the bag. ? Leave the ice on for 20 minutes, 2-3 times per day. Activity  Rest as told by your health care provider.  Avoid activities that cause pain. These include any activities that use your chest muscles or your abdominal and side muscles to lift heavy items. Ask your health care provider what activities are safe for you. General instructions   Take over-the-counter and prescription medicines only as told by your health care provider.  Do not use any products that contain nicotine or tobacco, such  as cigarettes, e-cigarettes, and chewing tobacco. These can delay healing after injury. If you need help quitting, ask your health care provider.  Keep all follow-up visits as told by your health care provider. This is important. Contact a health care provider if:  You have a fever.  Your chest pain becomes worse.  You have new symptoms. Get help right away if:  You have nausea or vomiting.  You feel sweaty or light-headed.  You have a cough with mucus from your lungs (sputum) or you cough up blood.  You develop shortness of breath. These symptoms may  represent a serious problem that is an emergency. Do not wait to see if the symptoms will go away. Get medical help right away. Call your local emergency services (911 in the [place] Do not drive yourself to the hospital. Summary  Chest wall pain is pain in or around the bones and muscles of your chest.  Depending on the cause, it may be treated with ice, rest, medicines, and avoiding activities that cause pain.  Contact a health care provider if you have a fever, worsening chest pain, or new symptoms.  Get help right away if you feel light-headed or you develop shortness of breath. These symptoms may be an emergency. This information is not intended to replace advice given to you by your health care provider. Make sure you discuss any questions you have with your health care provider. Document Revised: 06/03/2018 Document Reviewed: 06/03/2018 Elsevier Patient Education  2020 [employer].
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
## 2019-12-19 NOTE — Telephone Encounter (Signed)
Patient called stating that she has pain rt ribs She state the pain has just started today. She states it comes and goes like a cramp. It may last just minutes.  She is at work and carried [employer] today.  She feels she may have pulled a muscle. No other symptoms. She states the pain takes her breath. 8-9 on scale. DT completed. Care advice read to patient. She verbalized understanding. Call transferred to office for scheduling.  Reason for Disposition . [1] Chest pain lasting < 5 minutes AND [2] NO chest pain or cardiac symptoms (e.g., breathing difficulty, sweating) now  Answer Assessment - Initial Assessment Questions 1. LOCATION: "Where does it hurt?"       Right side ribs 2. RADIATION: "Does the pain go anywhere else?" (e.g., into neck, jaw, arms, back)     no 3. ONSET: "When did the chest pain begin?" (Minutes, hours or days)      today 4. PATTERN "Does the pain come and go, or has it been constant since it started?"  "Does it get worse with exertion?"      Constant like a pulsing cramp 5. DURATION: "How long does it last" (e.g., seconds, minutes, hours)     About 2 minutes 6. SEVERITY: "How bad is the pain?"  (e.g., Scale 1-10; mild, moderate, or severe)    - MILD (1-3): doesn't interfere with normal activities     - MODERATE (4-7): interferes with normal activities or awakens from sleep    - SEVERE (8-10): excruciating pain, unable to do any normal activities       8-9 7. CARDIAC RISK FACTORS: "Do you have any history of heart problems or risk factors for heart disease?" (e.g., angina, prior heart attack; diabetes, high blood pressure, high cholesterol, smoker, or strong family history of heart disease)     Htn, CHF, 8. PULMONARY RISK FACTORS: "Do you have any history of lung disease?"  (e.g., blood clots in lung, asthma, emphysema, birth control pills)    no 9. CAUSE: "What do you think is causing the chest pain?"    Unsure carrying heavy books to work 26. OTHER SYMPTOMS: "Do  you have any other symptoms?" (e.g., dizziness, nausea, vomiting, sweating, fever, difficulty breathing, cough)      With the cramping it is difficult 11. PREGNANCY: "Is there any chance you are pregnant?" "When was your last menstrual period?"      No last week  Protocols used: CHEST PAIN-A-AH

## 2019-12-19 NOTE — Addendum Note (Signed)
Addended by: Lynnea Ferrier on: 12/19/2019 04:19 PM   Modules accepted: Orders

## 2019-12-20 ENCOUNTER — Other Ambulatory Visit: Payer: Self-pay

## 2019-12-20 DIAGNOSIS — R071 Chest pain on breathing: Secondary | ICD-10-CM

## 2019-12-20 DIAGNOSIS — D509 Iron deficiency anemia, unspecified: Secondary | ICD-10-CM

## 2019-12-20 DIAGNOSIS — R1011 Right upper quadrant pain: Secondary | ICD-10-CM | POA: Diagnosis not present

## 2019-12-20 DIAGNOSIS — R0782 Intercostal pain: Secondary | ICD-10-CM

## 2019-12-20 LAB — HEPATIC FUNCTION PANEL
ALT: 8 IU/L (ref 0–32)
AST: 11 IU/L (ref 0–40)
Albumin: 4.5 g/dL (ref 3.8–4.8)
Alkaline Phosphatase: 76 IU/L (ref 39–117)
Bilirubin Total: 0.2 mg/dL (ref 0.0–1.2)
Bilirubin, Direct: 0.09 mg/dL (ref 0.00–0.40)
Total Protein: 7.5 g/dL (ref 6.0–8.5)

## 2019-12-20 LAB — CBC
Hematocrit: 33 % — ABNORMAL LOW (ref 34.0–46.6)
Hemoglobin: 10.2 g/dL — ABNORMAL LOW (ref 11.1–15.9)
MCH: 24.1 pg — ABNORMAL LOW (ref 26.6–33.0)
MCHC: 30.9 g/dL — ABNORMAL LOW (ref 31.5–35.7)
MCV: 78 fL — ABNORMAL LOW (ref 79–97)
Platelets: 379 10*3/uL (ref 150–450)
RBC: 4.23 x10E6/uL (ref 3.77–5.28)
RDW: 17.1 % — ABNORMAL HIGH (ref 11.7–15.4)
WBC: 9.6 10*3/uL (ref 3.4–10.8)

## 2019-12-20 LAB — IRON,TIBC AND FERRITIN PANEL
Ferritin: 16 ng/mL (ref 15–150)
Iron Saturation: 44 % (ref 15–55)
Iron: 172 ug/dL — ABNORMAL HIGH (ref 27–159)
Total Iron Binding Capacity: 395 ug/dL (ref 250–450)
UIBC: 223 ug/dL (ref 131–425)

## 2019-12-20 LAB — TSH: TSH: 0.606 u[IU]/mL (ref 0.450–4.500)
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
## 2019-12-30 ENCOUNTER — Other Ambulatory Visit: Payer: Self-pay

## 2020-01-04 ENCOUNTER — Encounter: Payer: Self-pay | Admitting: *Deleted

## 2020-01-04 ENCOUNTER — Telehealth (INDEPENDENT_AMBULATORY_CARE_PROVIDER_SITE_OTHER): Payer: BC Managed Care – PPO | Admitting: Cardiology

## 2020-01-04 VITALS — Ht 70.0 in | Wt 215.0 lb

## 2020-01-04 DIAGNOSIS — I10 Essential (primary) hypertension: Secondary | ICD-10-CM

## 2020-01-04 DIAGNOSIS — Z9119 Patient's noncompliance with other medical treatment and regimen: Secondary | ICD-10-CM | POA: Diagnosis not present

## 2020-01-04 DIAGNOSIS — Z91199 Patient's noncompliance with other medical treatment and regimen due to unspecified reason: Secondary | ICD-10-CM

## 2020-01-04 NOTE — Patient Instructions (Signed)
Medication Instructions:  NO CHANGE *If you need a refill on your cardiac medications before your next appointment, please call your pharmacy*  Lab Work: If you have labs (blood work) drawn today and your tests are completely normal, you will receive your results only by: Marland Kitchen MyChart Message (if you have MyChart) OR . A paper copy in the mail If you have any lab test that is abnormal or we need to change your treatment, we will call you to review the results.  Follow-Up: At [facility], you and your health needs are our priority.  As part of our continuing mission to provide you with exceptional heart care, we have created designated Provider Care Teams.  These Care Teams include your primary Cardiologist (physician) and Advanced Practice Providers (APPs -  Physician Assistants and Nurse Practitioners) who all work together to provide you with the care you need, when you need it.  Your next appointment:   FOLLOW UP IN 8-12 [place] MD IN [facility]

## 2020-01-05 DIAGNOSIS — Z20828 Contact with and (suspected) exposure to other viral communicable diseases: Secondary | ICD-10-CM | POA: Diagnosis not present

## 2020-01-05 MED ORDER — LABETALOL HCL 200 MG PO TABS: 200.0000 mg | ORAL_TABLET | Freq: Two times a day (BID) | ORAL | 3 refills | Status: DC

## 2020-02-03 ENCOUNTER — Telehealth: Payer: Self-pay | Admitting: Family Medicine

## 2020-02-03 NOTE — Telephone Encounter (Signed)
error 

## 2020-02-08 ENCOUNTER — Encounter: Payer: Self-pay | Admitting: Family Medicine

## 2020-02-09 DIAGNOSIS — Z20828 Contact with and (suspected) exposure to other viral communicable diseases: Secondary | ICD-10-CM | POA: Diagnosis not present

## 2020-02-10 ENCOUNTER — Other Ambulatory Visit: Payer: Self-pay

## 2020-02-10 ENCOUNTER — Encounter: Payer: Self-pay | Admitting: Family Medicine

## 2020-02-10 ENCOUNTER — Telehealth (INDEPENDENT_AMBULATORY_CARE_PROVIDER_SITE_OTHER): Payer: BC Managed Care – PPO | Admitting: Family Medicine

## 2020-02-10 VITALS — Temp 98.2°F | Ht 70.0 in | Wt 215.0 lb

## 2020-02-10 DIAGNOSIS — B349 Viral infection, unspecified: Secondary | ICD-10-CM | POA: Diagnosis not present

## 2020-02-10 MED ORDER — BENZONATATE 100 MG PO CAPS: 100.0000 mg | ORAL_CAPSULE | Freq: Three times a day (TID) | ORAL | 0 refills | Status: DC | PRN
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
## 2020-02-11 ENCOUNTER — Encounter: Payer: Self-pay | Admitting: Family Medicine

## 2020-02-16 ENCOUNTER — Telehealth (INDEPENDENT_AMBULATORY_CARE_PROVIDER_SITE_OTHER): Payer: BC Managed Care – PPO | Admitting: Family Medicine

## 2020-02-16 ENCOUNTER — Encounter: Payer: Self-pay | Admitting: Family Medicine

## 2020-02-16 VITALS — Temp 98.7°F | Ht 70.0 in | Wt 221.0 lb

## 2020-02-16 DIAGNOSIS — B349 Viral infection, unspecified: Secondary | ICD-10-CM

## 2020-02-16 DIAGNOSIS — D509 Iron deficiency anemia, unspecified: Secondary | ICD-10-CM

## 2020-02-16 DIAGNOSIS — J4521 Mild intermittent asthma with (acute) exacerbation: Secondary | ICD-10-CM

## 2020-02-16 MED ORDER — PREDNISONE 10 MG PO TABS: 10.0000 mg | ORAL_TABLET | Freq: Two times a day (BID) | ORAL | 0 refills | Status: AC
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
## 2020-02-19 DIAGNOSIS — Z20828 Contact with and (suspected) exposure to other viral communicable diseases: Secondary | ICD-10-CM | POA: Diagnosis not present

## 2020-02-19 DIAGNOSIS — I10 Essential (primary) hypertension: Secondary | ICD-10-CM | POA: Diagnosis not present

## 2020-02-19 DIAGNOSIS — Z03818 Encounter for observation for suspected exposure to other biological agents ruled out: Secondary | ICD-10-CM | POA: Diagnosis not present

## 2020-02-20 DIAGNOSIS — Z1151 Encounter for screening for human papillomavirus (HPV): Secondary | ICD-10-CM | POA: Diagnosis not present

## 2020-02-20 DIAGNOSIS — Z1231 Encounter for screening mammogram for malignant neoplasm of breast: Secondary | ICD-10-CM | POA: Diagnosis not present

## 2020-02-20 DIAGNOSIS — Z01419 Encounter for gynecological examination (general) (routine) without abnormal findings: Secondary | ICD-10-CM | POA: Diagnosis not present

## 2020-02-20 DIAGNOSIS — Z6832 Body mass index (BMI) 32.0-32.9, adult: Secondary | ICD-10-CM | POA: Diagnosis not present

## 2020-02-27 ENCOUNTER — Encounter: Payer: Self-pay | Admitting: Family Medicine

## 2020-02-27 ENCOUNTER — Telehealth: Payer: Self-pay | Admitting: Cardiology

## 2020-02-27 NOTE — Telephone Encounter (Signed)
New message  STAT if patient feels like he/she is going to faint   1) Are you dizzy now?not sure, patient is in bed   2) Do you feel faint or have you passed out?no   3) Do you have any other symptoms? Fatigued   4) Have you checked your HR and BP (record if available)? No

## 2020-02-27 NOTE — Telephone Encounter (Signed)
Returned the call to the patient. She stated that she is still having dizziness for the past two weeks. She has discussed this with her PCP.  She stated that she is able to check her blood pressure and heart rate at home but has not done that recently. She has been advised to do this especially when she is feeling the symptoms.   She has a virtual appointment with Kerin Ransom, PA on Wednesday. She has been advised to follow up with her PCP about her recurring symptoms.

## 2020-02-28 NOTE — Telephone Encounter (Signed)
Symptoms reported are usually from dehydration. Please drink lots of water. Will need to see the patient [place] if they don't.

## 2020-02-29 ENCOUNTER — Encounter: Payer: Self-pay | Admitting: Cardiology

## 2020-02-29 ENCOUNTER — Telehealth (INDEPENDENT_AMBULATORY_CARE_PROVIDER_SITE_OTHER): Payer: BC Managed Care – PPO | Admitting: Cardiology

## 2020-02-29 VITALS — BP 130/80 | Ht 70.0 in

## 2020-02-29 DIAGNOSIS — I10 Essential (primary) hypertension: Secondary | ICD-10-CM | POA: Diagnosis not present

## 2020-02-29 DIAGNOSIS — U071 COVID-19: Secondary | ICD-10-CM

## 2020-02-29 DIAGNOSIS — E7800 Pure hypercholesterolemia, unspecified: Secondary | ICD-10-CM

## 2020-02-29 DIAGNOSIS — I119 Hypertensive heart disease without heart failure: Secondary | ICD-10-CM

## 2020-02-29 NOTE — Patient Instructions (Signed)
Medication Instructions:  Your physician recommends that you continue on your current medications as directed. Please refer to the Current Medication list given to you today.  *If you need a refill on your cardiac medications before your next appointment, please call your pharmacy*   Lab Work: Your physician recommends that you return for lab work: this week (BMET and CBC). You do not need an appointment. Our office lab is open from 8 AM to 4 PM, and closed for lunch from 12:45-1:45PM.  If you have labs (blood work) drawn today and your tests are completely normal, you will receive your results only by: Marland Kitchen MyChart Message (if you have MyChart) OR . A paper copy in the mail If you have any lab test that is abnormal or we need to change your treatment, we will call you to review the results.   Follow-Up: At [facility], you and your health needs are our priority.  As part of our continuing mission to provide you with exceptional heart care, we have created designated Provider Care Teams.  These Care Teams include your primary Cardiologist (physician) and Advanced Practice Providers (APPs -  Physician Assistants and Nurse Practitioners) who all work together to provide you with the care you need, when you need it.  We recommend signing up for the patient portal called "MyChart".  Sign up information is provided on this After Visit Summary.  MyChart is used to connect with patients for Virtual Visits (Telemedicine).  Patients are able to view lab/test results, encounter notes, upcoming appointments, etc.  Non-urgent messages can be sent to your provider as well.   To learn more about what you can do with MyChart, go to [email or web address].    Your next appointment:   4 week(s)  The format for your next appointment:   In Person  Provider:   You may see Kirk Ruths, MD or one of the following Advanced Practice Providers on your designated Care Team:   -Fabian Sharp, [place]   Other  Instructions Your physician has requested that you continue to regularly monitor and record your blood pressure and heart rate readings at home. Please use the same machine at the same time of day to check your readings and record them to bring to your follow-up visit.

## 2020-02-29 NOTE — Addendum Note (Signed)
Addended by: Therisa Doyne on: 02/29/2020 04:20 PM   Modules accepted: Orders
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
## 2020-03-01 ENCOUNTER — Telehealth: Payer: Self-pay | Admitting: Cardiology

## 2020-03-01 NOTE — Telephone Encounter (Signed)
Called to schedule 4 week follow up appointment with Leslie [name] per 02/29/20 staff message---voice mail box is full

## 2020-03-09 DIAGNOSIS — I10 Essential (primary) hypertension: Secondary | ICD-10-CM | POA: Diagnosis not present

## 2020-03-09 DIAGNOSIS — U071 COVID-19: Secondary | ICD-10-CM | POA: Diagnosis not present

## 2020-03-09 DIAGNOSIS — E7800 Pure hypercholesterolemia, unspecified: Secondary | ICD-10-CM | POA: Diagnosis not present

## 2020-03-09 DIAGNOSIS — I119 Hypertensive heart disease without heart failure: Secondary | ICD-10-CM | POA: Diagnosis not present

## 2020-03-10 LAB — CBC

## 2020-03-10 LAB — BASIC METABOLIC PANEL
BUN/Creatinine Ratio: 19 (ref 9–23)
BUN: 14 mg/dL (ref 6–24)
CO2: 20 mmol/L (ref 20–29)
Calcium: 9.3 mg/dL (ref 8.7–10.2)
Chloride: 102 mmol/L (ref 96–106)
Creatinine, Ser: 0.72 mg/dL (ref 0.57–1.00)
GFR calc Af Amer: 118 mL/min/{1.73_m2} (ref >59)
GFR calc non Af Amer: 102 mL/min/{1.73_m2} (ref >59)
Glucose: 92 mg/dL (ref 65–99)
Potassium: 4.1 mmol/L (ref 3.5–5.2)
Sodium: 140 mmol/L (ref 134–144)

## 2020-03-12 ENCOUNTER — Telehealth: Payer: Self-pay | Admitting: Family Medicine

## 2020-03-12 NOTE — Telephone Encounter (Signed)
Flavored water without sugar.

## 2020-03-12 NOTE — Telephone Encounter (Signed)
PT states she tried to have blood drawn for labs but nurse was unable to get enough. They told her to drink more water and come back. Patient wants to know what else she can drink other than water. Please call her and advise.

## 2020-03-13 NOTE — Telephone Encounter (Signed)
Tried calling patient no answer and unable to leave a message. Sent patient a message via [place] in =forming her of recommendations for blood draw.

## 2020-03-16 ENCOUNTER — Encounter: Payer: Self-pay | Admitting: Family Medicine

## 2020-04-10 ENCOUNTER — Telehealth: Payer: Self-pay | Admitting: *Deleted

## 2020-04-10 ENCOUNTER — Encounter: Payer: Self-pay | Admitting: Cardiology

## 2020-04-10 ENCOUNTER — Telehealth (INDEPENDENT_AMBULATORY_CARE_PROVIDER_SITE_OTHER): Payer: BC Managed Care – PPO | Admitting: Cardiology

## 2020-04-10 DIAGNOSIS — E7800 Pure hypercholesterolemia, unspecified: Secondary | ICD-10-CM

## 2020-04-10 DIAGNOSIS — I10 Essential (primary) hypertension: Secondary | ICD-10-CM

## 2020-04-10 NOTE — Patient Instructions (Addendum)
Medication Instructions:   Your physician recommends that you continue on your current medications as directed. Please refer to the Current Medication list given to you today.  *If you need a refill on your cardiac medications before your next appointment, please call your pharmacy*   Lab Work: [place]  If you have labs (blood work) drawn today and your tests are completely normal, you will receive your results only by: Marland Kitchen MyChart Message (if you have MyChart) OR . A paper copy in the mail If you have any lab test that is abnormal or we need to change your treatment, we will call you to review the results.   Testing/Procedures: NONE ORDERED  TODAY   Follow-Up: At [facility], you and your health needs are our priority.  As part of our continuing mission to provide you with exceptional heart care, we have created designated Provider Care Teams.  These Care Teams include your primary Cardiologist (physician) and Advanced Practice Providers (APPs -  Physician Assistants and Nurse Practitioners) who all work together to provide you with the care you need, when you need it.  We recommend signing up for the patient portal called "MyChart".  Sign up information is provided on this After Visit Summary.  MyChart is used to connect with patients for Virtual Visits (Telemedicine).  Patients are able to view lab/test results, encounter notes, upcoming appointments, etc.  Non-urgent messages can be sent to your provider as well.   To learn more about what you can do with MyChart, go to [email or web address].    Your next appointment:   3 month(s)  The format for your next appointment:   In Person  Provider:  Stanford Breed or app    Other Instructions
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
## 2020-04-10 NOTE — Telephone Encounter (Signed)
Multiple attempts to contact pt and cant leave vm  Mailbox full

## 2020-06-21 ENCOUNTER — Other Ambulatory Visit: Payer: Self-pay

## 2020-06-22 ENCOUNTER — Encounter: Payer: Self-pay | Admitting: Family Medicine

## 2020-06-22 ENCOUNTER — Ambulatory Visit: Payer: BC Managed Care – PPO | Admitting: Family Medicine

## 2020-06-22 VITALS — BP 152/84 | HR 94 | Temp 98.1°F | Ht 70.0 in | Wt 221.0 lb

## 2020-06-22 DIAGNOSIS — Z0000 Encounter for general adult medical examination without abnormal findings: Secondary | ICD-10-CM

## 2020-06-22 DIAGNOSIS — D509 Iron deficiency anemia, unspecified: Secondary | ICD-10-CM

## 2020-06-22 DIAGNOSIS — Z9119 Patient's noncompliance with other medical treatment and regimen: Secondary | ICD-10-CM

## 2020-06-22 DIAGNOSIS — I10 Essential (primary) hypertension: Secondary | ICD-10-CM | POA: Diagnosis not present

## 2020-06-22 DIAGNOSIS — E7800 Pure hypercholesterolemia, unspecified: Secondary | ICD-10-CM | POA: Diagnosis not present

## 2020-06-22 DIAGNOSIS — Z1211 Encounter for screening for malignant neoplasm of colon: Secondary | ICD-10-CM

## 2020-06-22 DIAGNOSIS — Z91199 Patient's noncompliance with other medical treatment and regimen due to unspecified reason: Secondary | ICD-10-CM
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
## 2020-06-22 NOTE — Patient Instructions (Addendum)

## 2020-06-27 ENCOUNTER — Other Ambulatory Visit: Payer: Self-pay

## 2020-06-27 DIAGNOSIS — Z0000 Encounter for general adult medical examination without abnormal findings: Secondary | ICD-10-CM

## 2020-06-27 DIAGNOSIS — D509 Iron deficiency anemia, unspecified: Secondary | ICD-10-CM

## 2020-06-27 DIAGNOSIS — E7800 Pure hypercholesterolemia, unspecified: Secondary | ICD-10-CM | POA: Diagnosis not present

## 2020-06-27 DIAGNOSIS — I10 Essential (primary) hypertension: Secondary | ICD-10-CM | POA: Diagnosis not present

## 2020-06-28 LAB — CBC
Hematocrit: 33.6 % — ABNORMAL LOW (ref 34.0–46.6)
Hemoglobin: 10.4 g/dL — ABNORMAL LOW (ref 11.1–15.9)
MCH: 23.9 pg — ABNORMAL LOW (ref 26.6–33.0)
MCHC: 31 g/dL — ABNORMAL LOW (ref 31.5–35.7)
MCV: 77 fL — ABNORMAL LOW (ref 79–97)
Platelets: 386 10*3/uL (ref 150–450)
RBC: 4.36 x10E6/uL (ref 3.77–5.28)
RDW: 16.1 % — ABNORMAL HIGH (ref 11.7–15.4)
WBC: 7.3 10*3/uL (ref 3.4–10.8)

## 2020-06-28 LAB — IRON,TIBC AND FERRITIN PANEL
Ferritin: 13 ng/mL — ABNORMAL LOW (ref 15–150)
Iron Saturation: 10 % — ABNORMAL LOW (ref 15–55)
Iron: 38 ug/dL (ref 27–159)
Total Iron Binding Capacity: 390 ug/dL (ref 250–450)
UIBC: 352 ug/dL (ref 131–425)

## 2020-06-28 LAB — BASIC METABOLIC PANEL
BUN/Creatinine Ratio: 19 (ref 9–23)
BUN: 13 mg/dL (ref 6–24)
CO2: 25 mmol/L (ref 20–29)
Calcium: 9.3 mg/dL (ref 8.7–10.2)
Chloride: 101 mmol/L (ref 96–106)
Creatinine, Ser: 0.7 mg/dL (ref 0.57–1.00)
GFR calc Af Amer: 121 mL/min/{1.73_m2} (ref >59)
GFR calc non Af Amer: 105 mL/min/{1.73_m2} (ref >59)
Glucose: 89 mg/dL (ref 65–99)
Potassium: 4 mmol/L (ref 3.5–5.2)
Sodium: 139 mmol/L (ref 134–144)

## 2020-06-28 LAB — HEPATIC FUNCTION PANEL
ALT: 10 IU/L (ref 0–32)
AST: 11 IU/L (ref 0–40)
Albumin: 4.4 g/dL (ref 3.8–4.8)
Alkaline Phosphatase: 67 IU/L (ref 48–121)
Bilirubin Total: <0.2 mg/dL (ref 0.0–1.2)
Bilirubin, Direct: 0.06 mg/dL (ref 0.00–0.40)
Total Protein: 7.2 g/dL (ref 6.0–8.5)

## 2020-06-28 LAB — LDL CHOLESTEROL, DIRECT: LDL Direct: 136 mg/dL — ABNORMAL HIGH (ref 0–99)

## 2020-07-05 ENCOUNTER — Telehealth: Payer: Self-pay

## 2020-07-05 NOTE — Telephone Encounter (Signed)
Pt would like to know what she can take for the constipation that the iron causes.   Please advise.

## 2020-07-06 NOTE — Telephone Encounter (Signed)
Colace 100mg  one twice daily.

## 2020-07-06 NOTE — Telephone Encounter (Signed)
Left detailed message informing patient of PCP recommendation to take 100 mg colace bid.   Informed via vm to call back if she has any additional questions or concerns.

## 2020-07-11 ENCOUNTER — Ambulatory Visit: Payer: BC Managed Care – PPO | Admitting: Cardiology

## 2020-07-13 ENCOUNTER — Other Ambulatory Visit: Payer: Self-pay

## 2020-07-13 ENCOUNTER — Ambulatory Visit (INDEPENDENT_AMBULATORY_CARE_PROVIDER_SITE_OTHER): Payer: BC Managed Care – PPO | Admitting: Cardiology

## 2020-07-13 DIAGNOSIS — U071 COVID-19: Secondary | ICD-10-CM | POA: Diagnosis not present

## 2020-07-13 DIAGNOSIS — I10 Essential (primary) hypertension: Secondary | ICD-10-CM

## 2020-07-13 MED ORDER — LABETALOL HCL 200 MG PO TABS: 200.0000 mg | ORAL_TABLET | Freq: Two times a day (BID) | ORAL | 3 refills | Status: DC

## 2020-07-13 MED ORDER — HYDRALAZINE HCL 25 MG PO TABS
25.0000 mg | ORAL_TABLET | Freq: Two times a day (BID) | ORAL | 1 refills | Status: DC
Start: 2020-07-13 — End: 2020-09-06

## 2020-07-13 MED ORDER — VALSARTAN-HYDROCHLOROTHIAZIDE 320-25 MG PO TABS: 0.5000 | ORAL_TABLET | Freq: Every day | ORAL | 3 refills | Status: DC

## 2020-07-13 MED ORDER — AMLODIPINE BESYLATE 10 MG PO TABS: 10.0000 mg | ORAL_TABLET | Freq: Every day | ORAL | 3 refills | Status: DC

## 2020-07-13 MED ORDER — POTASSIUM CHLORIDE CRYS ER 20 MEQ PO TBCR: 20.0000 meq | EXTENDED_RELEASE_TABLET | Freq: Once | ORAL | 0 refills | Status: DC

## 2020-07-13 NOTE — Assessment & Plan Note (Signed)
B/P by me 172/110.  I suggested adding Hydralazine 25 mg BID, she is not happy about this but did not want to try increasing her Diovan/HCTZ.  She is on multiple medications and I will ask Dr Oval Linsey in the HTN to evaluate her.
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
## 2020-07-13 NOTE — Patient Instructions (Signed)
Medication Instructions:   START Hydralazine 25 mg twice daily.  *If you need a refill on your cardiac medications before your next appointment, please call your pharmacy*   Follow-Up: At [facility], you and your health needs are our priority.  As part of our continuing mission to provide you with exceptional heart care, we have created designated Provider Care Teams.  These Care Teams include your primary Cardiologist (physician) and Advanced Practice Providers (APPs -  Physician Assistants and Nurse Practitioners) who all work together to provide you with the care you need, when you need it.  We recommend signing up for the patient portal called "MyChart".  Sign up information is provided on this After Visit Summary.  MyChart is used to connect with patients for Virtual Visits (Telemedicine).  Patients are able to view lab/test results, encounter notes, upcoming appointments, etc.  Non-urgent messages can be sent to your provider as well.   To learn more about what you can do with MyChart, go to [email or web address].    Your next appointment:   Next Available  The format for your next appointment:   In Person  Provider:   Dr. Skeet Latch in [facility]

## 2020-07-13 NOTE — Assessment & Plan Note (Signed)
Jan 2021

## 2020-07-22 DIAGNOSIS — Z20822 Contact with and (suspected) exposure to covid-19: Secondary | ICD-10-CM | POA: Diagnosis not present

## 2020-07-25 ENCOUNTER — Telehealth: Payer: Self-pay | Admitting: Cardiology

## 2020-07-25 NOTE — Telephone Encounter (Signed)
Attempted to contact patient to schedule follow up with [facility] from recall list, no answer and unable to leave voicemail cause mailbox was full
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
## 2020-08-08 ENCOUNTER — Telehealth: Payer: Self-pay | Admitting: General Practice

## 2020-08-08 ENCOUNTER — Encounter: Payer: Self-pay | Admitting: General Practice

## 2020-08-08 ENCOUNTER — Telehealth (INDEPENDENT_AMBULATORY_CARE_PROVIDER_SITE_OTHER): Payer: BC Managed Care – PPO | Admitting: General Practice

## 2020-08-08 VITALS — Ht 70.0 in | Wt 215.0 lb

## 2020-08-08 DIAGNOSIS — I10 Essential (primary) hypertension: Secondary | ICD-10-CM

## 2020-08-08 NOTE — Telephone Encounter (Signed)
Left Message for patient to call and schedule 2 weeks pharmacy BP check and 2 month follow up with Dr. Oval Linsey

## 2020-08-08 NOTE — Patient Instructions (Signed)
Medication Instructions:  Continue current medications  *If you need a refill on your cardiac medications before your next appointment, please call your pharmacy*   Lab Work: None Ordered   Testing/Procedures: None Ordered   Follow-Up: At [facility], you and your health needs are our priority.  As part of our continuing mission to provide you with exceptional heart care, we have created designated Provider Care Teams.  These Care Teams include your primary Cardiologist (physician) and Advanced Practice Providers (APPs -  Physician Assistants and Nurse Practitioners) who all work together to provide you with the care you need, when you need it.  We recommend signing up for the patient portal called "MyChart".  Sign up information is provided on this After Visit Summary.  MyChart is used to connect with patients for Virtual Visits (Telemedicine).  Patients are able to view lab/test results, encounter notes, upcoming appointments, etc.  Non-urgent messages can be sent to your provider as well.   To learn more about what you can do with MyChart, go to [email or web address].    Your next appointment:   2 Weeks follow up for blood pressure check 2 Months follow up with Dr Oval Linsey

## 2020-08-09 ENCOUNTER — Encounter: Payer: Self-pay | Admitting: Family Medicine

## 2020-09-06 ENCOUNTER — Ambulatory Visit: Payer: BC Managed Care – PPO | Admitting: Cardiovascular Disease

## 2020-09-06 ENCOUNTER — Encounter: Payer: Self-pay | Admitting: Cardiovascular Disease

## 2020-09-06 ENCOUNTER — Other Ambulatory Visit: Payer: Self-pay

## 2020-09-06 VITALS — BP 140/100 | HR 84 | Ht 69.0 in | Wt 216.0 lb

## 2020-09-06 DIAGNOSIS — I110 Hypertensive heart disease with heart failure: Secondary | ICD-10-CM | POA: Diagnosis not present

## 2020-09-06 DIAGNOSIS — Z9119 Patient's noncompliance with other medical treatment and regimen: Secondary | ICD-10-CM

## 2020-09-06 DIAGNOSIS — I5032 Chronic diastolic (congestive) heart failure: Secondary | ICD-10-CM

## 2020-09-06 DIAGNOSIS — I10 Essential (primary) hypertension: Secondary | ICD-10-CM | POA: Diagnosis not present

## 2020-09-06 DIAGNOSIS — Z91199 Patient's noncompliance with other medical treatment and regimen due to unspecified reason: Secondary | ICD-10-CM

## 2020-09-06 MED ORDER — BYSTOLIC 20 MG PO TABS: 20.0000 mg | ORAL_TABLET | Freq: Every day | ORAL | 1 refills | Status: DC

## 2020-09-06 MED ORDER — VALSARTAN-HYDROCHLOROTHIAZIDE 320-25 MG PO TABS: 0.5000 | ORAL_TABLET | Freq: Every day | ORAL | 3 refills | Status: DC

## 2020-09-06 NOTE — Patient Instructions (Addendum)
Medication Instructions:  STOP LABETALOL   START BYSTOLIC 20 MG AT BEDTIME   Labwork: NONE    Testing/Procedures: Your physician has requested that you have a renal artery duplex. During this test, an ultrasound is used to evaluate blood flow to the kidneys. Allow one hour for this exam. Do not eat after midnight the day before and avoid carbonated beverages. Take your medications as you usually do.   Follow-Up: 1 MONTH WITH PHARM D 10/09/2020 AT 8:30 AM   WILL HAVE AMY, CARE GUIDE REACH OUT TO YOU FOR HELP WITH STRESS MANAGEMENT    Special Instructions:   MONITOR YOUR BLOOD PRESSURE TWICE A DAY, LOG IN BOOK PROVIDED. BRING BOOK AND MACHINE TO FOLLOW UP  DASH Eating Plan DASH stands for "Dietary Approaches to Stop Hypertension." The DASH eating plan is a healthy eating plan that has been shown to reduce high blood pressure (hypertension). It may also reduce your risk for type 2 diabetes, heart disease, and stroke. The DASH eating plan may also help with weight loss. What are tips for following this plan?  General guidelines  Avoid eating more than 2,300 mg (milligrams) of salt (sodium) a day. If you have hypertension, you may need to reduce your sodium intake to 1,500 mg a day.  Limit alcohol intake to no more than 1 drink a day for nonpregnant women and 2 drinks a day for men. One drink equals 12 oz of beer, 5 oz of wine, or 1 oz of hard liquor.  Work with your health care provider to maintain a healthy body weight or to lose weight. Ask what an ideal weight is for you.  Get at least 30 minutes of exercise that causes your heart to beat faster (aerobic exercise) most days of the week. Activities may include walking, swimming, or biking.  Work with your health care provider or diet and nutrition specialist (dietitian) to adjust your eating plan to your individual calorie needs. Reading food labels   Check food labels for the amount of sodium per serving. Choose foods with less  than 5 percent of the Daily Value of sodium. Generally, foods with less than 300 mg of sodium per serving fit into this eating plan.  To find whole grains, look for the word "whole" as the first word in the ingredient list. Shopping  Buy products labeled as "low-sodium" or "no salt added."  Buy fresh foods. Avoid canned foods and premade or frozen meals. Cooking  Avoid adding salt when cooking. Use salt-free seasonings or herbs instead of table salt or sea salt. Check with your health care provider or pharmacist before using salt substitutes.  Do not fry foods. Cook foods using healthy methods such as baking, boiling, grilling, and broiling instead.  Cook with heart-healthy oils, such as olive, canola, soybean, or sunflower oil. Meal planning  Eat a balanced diet that includes: ? 5 or more servings of fruits and vegetables each day. At each meal, try to fill half of your plate with fruits and vegetables. ? Up to 6-8 servings of whole grains each day. ? Less than 6 oz of lean meat, poultry, or fish each day. A 3-oz serving of meat is about the same size as a deck of cards. One egg equals 1 oz. ? 2 servings of low-fat dairy each day. ? A serving of nuts, seeds, or beans 5 times each week. ? Heart-healthy fats. Healthy fats called Omega-3 fatty acids are found in foods such as flaxseeds and coldwater fish, like  sardines, salmon, and mackerel.  Limit how much you eat of the following: ? Canned or prepackaged foods. ? Food that is high in trans fat, such as fried foods. ? Food that is high in saturated fat, such as fatty meat. ? Sweets, desserts, sugary drinks, and other foods with added sugar. ? Full-fat dairy products.  Do not salt foods before eating.  Try to eat at least 2 vegetarian meals each week.  Eat more home-cooked food and less restaurant, buffet, and fast food.  When eating at a restaurant, ask that your food be prepared with less salt or no salt, if possible. What  foods are recommended? The items listed may not be a complete list. Talk with your dietitian about what dietary choices are best for you. Grains Whole-grain or whole-wheat bread. Whole-grain or whole-wheat pasta. Brown rice. [place]row. Bulgur. Whole-grain and low-sodium cereals. Pita bread. Low-fat, low-sodium crackers. Whole-wheat flour tortillas. Vegetables Fresh or frozen vegetables (raw, steamed, roasted, or grilled). Low-sodium or reduced-sodium tomato and vegetable juice. Low-sodium or reduced-sodium tomato sauce and tomato paste. Low-sodium or reduced-sodium canned vegetables. Fruits All fresh, dried, or frozen fruit. Canned fruit in natural juice (without added sugar). Meat and other protein foods Skinless chicken or [place]. Ground chicken or [place]. Pork with fat trimmed off. Fish and seafood. Egg whites. Dried beans, peas, or lentils. Unsalted nuts, nut butters, and seeds. Unsalted canned beans. Lean cuts of beef with fat trimmed off. Low-sodium, lean deli meat. Dairy Low-fat (1%) or fat-free (skim) milk. Fat-free, low-fat, or reduced-fat cheeses. Nonfat, low-sodium ricotta or cottage cheese. Low-fat or nonfat yogurt. Low-fat, low-sodium cheese. Fats and oils Soft margarine without trans fats. Vegetable oil. Low-fat, reduced-fat, or light mayonnaise and salad dressings (reduced-sodium). Canola, safflower, olive, soybean, and sunflower oils. Avocado. Seasoning and other foods Herbs. Spices. Seasoning mixes without salt. Unsalted popcorn and pretzels. Fat-free sweets. What foods are not recommended? The items listed may not be a complete list. Talk with your dietitian about what dietary choices are best for you. Grains Baked goods made with fat, such as croissants, muffins, or some breads. Dry pasta or rice meal packs. Vegetables Creamed or fried vegetables. Vegetables in a cheese sauce. Regular canned vegetables (not low-sodium or reduced-sodium). Regular canned tomato sauce and  paste (not low-sodium or reduced-sodium). Regular tomato and vegetable juice (not low-sodium or reduced-sodium). Angie Fava. Olives. Fruits Canned fruit in a light or heavy syrup. Fried fruit. Fruit in cream or butter sauce. Meat and other protein foods Fatty cuts of meat. Ribs. Fried meat. Berniece Salines. Sausage. Bologna and other processed lunch meats. Salami. Fatback. Hotdogs. Bratwurst. Salted nuts and seeds. Canned beans with added salt. Canned or smoked fish. Whole eggs or egg yolks. Chicken or [place] with skin. Dairy Whole or 2% milk, cream, and half-and-half. Whole or full-fat cream cheese. Whole-fat or sweetened yogurt. Full-fat cheese. Nondairy creamers. Whipped toppings. Processed cheese and cheese spreads. Fats and oils Butter. Stick margarine. Lard. Shortening. Ghee. Bacon fat. Tropical oils, such as coconut, palm kernel, or palm oil. Seasoning and other foods Salted popcorn and pretzels. Onion salt, garlic salt, seasoned salt, table salt, and sea salt. Worcestershire sauce. Tartar sauce. Barbecue sauce. Teriyaki sauce. Soy sauce, including reduced-sodium. Steak sauce. Canned and packaged gravies. Fish sauce. Oyster sauce. Cocktail sauce. Horseradish that you find on the shelf. Ketchup. Mustard. Meat flavorings and tenderizers. Bouillon cubes. Hot sauce and Tabasco sauce. Premade or packaged marinades. Premade or packaged taco seasonings. Relishes. Regular salad dressings. Where to find more information:  National Heart, Lung, and Blood Institute: https://wilson-eaton.com/  American Heart Association: www.heart.org Summary  The DASH eating plan is a healthy eating plan that has been shown to reduce high blood pressure (hypertension). It may also reduce your risk for type 2 diabetes, heart disease, and stroke.  With the DASH eating plan, you should limit salt (sodium) intake to 2,300 mg a day. If you have hypertension, you may need to reduce your sodium intake to 1,500 mg a day.  When on the DASH  eating plan, aim to eat more fresh fruits and vegetables, whole grains, lean proteins, low-fat dairy, and heart-healthy fats.  Work with your health care provider or diet and nutrition specialist (dietitian) to adjust your eating plan to your individual calorie needs. This information is not intended to replace advice given to you by your health care provider. Make sure you discuss any questions you have with your health care provider. Document Released: 11/20/2011 Document Revised: 11/13/2017 Document Reviewed: 11/24/2016 Elsevier Patient Education  2020 [employer].
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
## 2020-09-17 ENCOUNTER — Telehealth: Payer: Self-pay

## 2020-09-17 DIAGNOSIS — Z0000 Encounter for general adult medical examination without abnormal findings: Secondary | ICD-10-CM

## 2020-09-17 NOTE — Telephone Encounter (Signed)
Called patient regarding health coaching for stress management. Left a message to call Care Guide at [phone]. Will also send a message through my chart.

## 2020-09-28 ENCOUNTER — Encounter: Payer: Self-pay | Admitting: *Deleted

## 2020-09-28 ENCOUNTER — Other Ambulatory Visit: Payer: Self-pay

## 2020-09-28 ENCOUNTER — Ambulatory Visit (HOSPITAL_COMMUNITY)
Admission: RE | Admit: 2020-09-28 | Discharge: 2020-09-28 | Disposition: A | Discharge status: Home / Self Care | Payer: BC Managed Care – PPO | Source: Ambulatory Visit | Attending: Cardiology | Admitting: Cardiology

## 2020-09-28 DIAGNOSIS — I10 Essential (primary) hypertension: Secondary | ICD-10-CM

## 2020-10-01 DIAGNOSIS — M67912 Unspecified disorder of synovium and tendon, left shoulder: Secondary | ICD-10-CM | POA: Diagnosis not present

## 2020-10-08 ENCOUNTER — Ambulatory Visit: Payer: BC Managed Care – PPO | Admitting: Cardiovascular Disease

## 2020-10-09 ENCOUNTER — Other Ambulatory Visit: Payer: Self-pay

## 2020-10-09 ENCOUNTER — Ambulatory Visit (INDEPENDENT_AMBULATORY_CARE_PROVIDER_SITE_OTHER): Payer: BC Managed Care – PPO | Admitting: Pharmacist

## 2020-10-09 VITALS — BP 138/90 | HR 80 | Ht 69.0 in | Wt 219.8 lb

## 2020-10-09 DIAGNOSIS — I5032 Chronic diastolic (congestive) heart failure: Secondary | ICD-10-CM

## 2020-10-09 DIAGNOSIS — I110 Hypertensive heart disease with heart failure: Secondary | ICD-10-CM | POA: Diagnosis not present

## 2020-10-09 MED ORDER — AMLODIPINE BESYLATE 10 MG PO TABS: 5.0000 mg | ORAL_TABLET | Freq: Every day | ORAL | 3 refills | Status: DC

## 2020-10-09 MED ORDER — LABETALOL HCL 200 MG PO TABS
ORAL_TABLET | ORAL | 1 refills | Status: DC
Start: 2020-10-09 — End: 2021-09-18

## 2020-10-09 NOTE — Patient Instructions (Addendum)
Return for a  follow up appointment AS NEEDED  Check your blood pressure at home daily (if able) and keep record of the readings.  Take your BP meds as follows: *INCREASE labetalol 100mg  in the mornings and 200mg  every evening  Bring all of your meds, your BP cuff and your record of home blood pressures to your next appointment.  Exercise as you're able, try to walk approximately 30 minutes per day.  Keep salt intake to a minimum, especially watch canned and prepared boxed foods.  Eat more fresh fruits and vegetables and fewer canned items.  Avoid eating in fast food restaurants.    HOW TO TAKE YOUR BLOOD PRESSURE: . Rest 5 minutes before taking your blood pressure. .  Don't smoke or drink caffeinated beverages for at least 30 minutes before. . Take your blood pressure before (not after) you eat. . Sit comfortably with your back supported and both feet on the floor (don't cross your legs). . Elevate your arm to heart level on a table or a desk. . Use the proper sized cuff. It should fit smoothly and snugly around your bare upper arm. There should be enough room to slip a fingertip under the cuff. The bottom edge of the cuff should be 1 inch above the crease of the elbow. . Ideally, take 3 measurements at one sitting and record the average.
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
## 2020-10-16 ENCOUNTER — Encounter: Payer: Self-pay | Admitting: Pharmacist

## 2020-10-16 NOTE — Assessment & Plan Note (Signed)
Blood pressure improved this morning, but remains above goal. Patient took labetalol today before coming to her appointment, but usually takes once daily instead for BID as prescribed. She refuses dietary modifications, stress management tools, appointment/call with Care Guide, PREP program, or new medication. No home BP readings were provided today either.  Leslie [name]' BP is responsive to current therapy, but she is not compliance with prescribed medication or positive lifestyle modifications. Her sodium intake remains elevated, and patient refuses ALL therapy modification.  Recommendation given to change labetalol to 1/2 tablet (100mg ) every morning to increase tolerability and continue taking labetalol 200mg  every evening. No follow up appointment with pharmacists indicated. Will forward current note to Dr Oval Linsey for further assessment and to determine if additional management is appropriate.

## 2020-11-19 ENCOUNTER — Telehealth (INDEPENDENT_AMBULATORY_CARE_PROVIDER_SITE_OTHER): Payer: BC Managed Care – PPO | Admitting: Nurse Practitioner

## 2020-11-19 ENCOUNTER — Encounter: Payer: Self-pay | Admitting: Podiatry

## 2020-11-19 ENCOUNTER — Ambulatory Visit: Payer: BLUE CROSS/BLUE SHIELD | Admitting: Podiatry

## 2020-11-19 ENCOUNTER — Other Ambulatory Visit: Payer: Self-pay | Admitting: Nurse Practitioner

## 2020-11-19 ENCOUNTER — Other Ambulatory Visit: Payer: Self-pay

## 2020-11-19 ENCOUNTER — Encounter: Payer: Self-pay | Admitting: Family Medicine

## 2020-11-19 ENCOUNTER — Encounter: Payer: Self-pay | Admitting: Nurse Practitioner

## 2020-11-19 VITALS — BP 144/111 | HR 72 | Ht 70.0 in | Wt 215.0 lb

## 2020-11-19 DIAGNOSIS — J069 Acute upper respiratory infection, unspecified: Secondary | ICD-10-CM | POA: Diagnosis not present

## 2020-11-19 DIAGNOSIS — B353 Tinea pedis: Secondary | ICD-10-CM

## 2020-11-19 DIAGNOSIS — N96 Recurrent pregnancy loss: Secondary | ICD-10-CM | POA: Insufficient documentation

## 2020-11-19 MED ORDER — BENZONATATE 100 MG PO CAPS: 100.0000 mg | ORAL_CAPSULE | Freq: Three times a day (TID) | ORAL | 0 refills | Status: DC | PRN

## 2020-11-19 MED ORDER — ECONAZOLE NITRATE 1 % EX CREA: TOPICAL_CREAM | Freq: Every day | CUTANEOUS | 0 refills | Status: DC

## 2020-11-19 MED ORDER — FLUTICASONE PROPIONATE 50 MCG/ACT NA SUSP: 2.0000 | Freq: Every day | NASAL | 0 refills | Status: DC

## 2020-11-19 MED ORDER — TERBINAFINE HCL 250 MG PO TABS: 250.0000 mg | ORAL_TABLET | Freq: Every day | ORAL | 0 refills | Status: DC

## 2020-11-19 NOTE — Progress Notes (Signed)
Virtual Visit via Video Note  I connected with@ on 11/19/20 at 10:45 AM EST by a video enabled telemedicine application and verified that I am speaking with the correct person using two identifiers.  Location: Patient:Home Provider: Office Participants: patient and provider   I discussed the limitations of evaluation and management by telemedicine and the availability of in person appointments. I also discussed with the patient that there may be a patient responsible charge related to this service. The patient expressed understanding and agreed to proceed.  CC:Pt c/o sore throat, productive cough but unsure of color of mucus, and nasal congestion x4 days. 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 Denies fever, chills, or body aches.   History of Present Illness: Unable to tolerate acetaminophen. Did not take BP medication today. URI  This is a new problem. The current episode started in the past 7 days. The problem has been gradually worsening. There has been no fever. Associated symptoms include congestion, coughing, headaches, joint pain, rhinorrhea, sinus pain, sneezing and a sore throat. Pertinent negatives include no swollen glands, vomiting or wheezing. She has tried increased fluids for the symptoms. The treatment provided no relief.   Observations/Objective: Physical Exam Vitals reviewed.  Constitutional:      General: She is not in acute distress. Pulmonary:     Effort: Pulmonary effort is normal.  Neurological:     Mental Status: She is alert and oriented to person, place, and time.  Psychiatric:        Mood and Affect: Mood normal.        Behavior: Behavior normal.        Thought Content: Thought content normal.    Assessment and Plan: Jewell was seen today for acute visit.  Diagnoses and all orders for this visit:  Viral upper respiratory tract infection -     fluticasone (FLONASE) 50 MCG/ACT nasal spray; Place 2 sprays into  both nostrils daily. -     benzonatate (TESSALON) 100 MG capsule; Take 1 capsule (100 mg total) by mouth 3 (three) times daily as needed for cough.   Follow Up Instructions: Go for COVID test. Work letter provided. URI Instructions: Flonase and Afrin use: apply 1spray of afrin in each nare, wait 38mins, then apply 2sprays of flonase in each nare. Use both nasal spray consecutively x 3days, then flonase only for at least 14days. Encourage adequate oral hydration. Avoid decongestants if you have high blood pressure. Use" Delsym" or" Robitussin" cough syrup varietis for cough.  You can use plain "Tylenol" or "Advi"l for fever, chills and achyness.  "Common cold" symptoms are usually triggered by a virus.  The antibiotics are usually not necessary. On average, a" viral cold" illness would take 4-7 days to resolve. Please, make an appointment if you are not better or if you're worse.  I discussed the assessment and treatment plan with the patient. The patient was provided an opportunity to ask questions and all were answered. The patient agreed with the plan and demonstrated an understanding of the instructions.   The patient was advised to call back or seek an in-person evaluation if the symptoms worsen or if the condition fails to improve as anticipated.  Wilfred Lacy, NP

## 2020-11-20 DIAGNOSIS — Z20822 Contact with and (suspected) exposure to covid-19: Secondary | ICD-10-CM | POA: Diagnosis not present

## 2020-11-21 ENCOUNTER — Encounter: Payer: Self-pay | Admitting: Nurse Practitioner

## 2020-11-21 NOTE — Patient Instructions (Signed)
Go for COVID test. Work letter provided. URI Instructions: Flonase and Afrin use: apply 1spray of afrin in each nare, wait 39mins, then apply 2sprays of flonase in each nare. Use both nasal spray consecutively x 3days, then flonase only for at least 14days. Encourage adequate oral hydration. Avoid decongestants if you have high blood pressure. Use" Delsym" or" Robitussin" cough syrup varietis for cough.  You can use plain "Tylenol" or "Advi"l for fever, chills and achyness.  "Common cold" symptoms are usually triggered by a virus.  The antibiotics are usually not necessary. On average, a" viral cold" illness would take 4-7 days to resolve. Please, make an appointment if you are not better or if you're worse.

## 2020-11-22 NOTE — Progress Notes (Addendum)
  Subjective:  Patient ID: Leslie [name], female    DOB: June 22, 1976,  MRN: [id number]  Chief Complaint  Patient presents with  . Tinea Pedis    Patient presents today for athletes foot right foot x 2-3 years.  She says her foot burns, stings and itches and has a crack on the bottom.  She has been using Ketaconazole cream, vaseline and neosporin for treatment    44 y.o. female presents with the above complaint. History confirmed with patient.   Objective:  Physical Exam: warm, good capillary refill, no trophic changes or ulcerative lesions, normal DP and PT pulses and normal sensory exam.  Right Foot: Dry scaling pruritic rash in moccasin distribution consistent with tinea pedis   Assessment:   1. Tinea pedis of right foot      Plan:  Patient was evaluated and treated and all questions answered.  Discussed the etiology and treatment options for tinea pedis.  Discussed topical and oral treatment.  Recommended treatment with econazole nitrate and terbinafine p.o course considering that she has not responded well with ketoconazole 2% treatment previously.  This was sent to the patient's pharmacy.  I recommend we also take a skin scraping and sent to the lab for review.  This was taken and sent to [facility] pathology to evaluate for tinea versus other dermatitis.  Also discussed appropriate foot hygiene, use of antifungal spray such as Tinactin in shoes, as well as cleaning her foot surfaces such as showers and bathroom floors with bleach.   Return in about 6 weeks (around 12/31/2020).

## 2020-11-27 ENCOUNTER — Encounter: Payer: Self-pay | Admitting: Nurse Practitioner

## 2020-12-06 ENCOUNTER — Other Ambulatory Visit: Payer: Self-pay | Admitting: Podiatry

## 2020-12-10 NOTE — Telephone Encounter (Signed)
Please Advise

## 2020-12-29 ENCOUNTER — Encounter: Payer: Self-pay | Admitting: Family Medicine

## 2021-01-08 ENCOUNTER — Encounter: Payer: Self-pay | Admitting: Family Medicine

## 2021-02-06 ENCOUNTER — Telehealth: Payer: Self-pay

## 2021-02-06 NOTE — Telephone Encounter (Signed)
Pt calling to request another referral for colonoscopy.  Please advise, [phone]

## 2021-02-07 NOTE — Addendum Note (Signed)
Addended by: Lynda Rainwater on: 02/07/2021 09:53 AM   Modules accepted: Orders

## 2021-02-07 NOTE — Telephone Encounter (Signed)
Patient had referral in July 2021 but was not able to schedule appointment due to work. New referral placed patient aware and will await appointment.

## 2021-02-25 ENCOUNTER — Encounter: Payer: Self-pay | Admitting: Gastroenterology

## 2021-03-22 ENCOUNTER — Other Ambulatory Visit: Payer: Self-pay

## 2021-03-22 ENCOUNTER — Encounter: Payer: Self-pay | Admitting: Family Medicine

## 2021-03-22 DIAGNOSIS — M5136 Other intervertebral disc degeneration, lumbar region: Secondary | ICD-10-CM

## 2021-03-22 MED ORDER — MELOXICAM 15 MG PO TABS: 15.0000 mg | ORAL_TABLET | Freq: Every day | ORAL | 0 refills | Status: DC | PRN

## 2021-03-28 ENCOUNTER — Ambulatory Visit (AMBULATORY_SURGERY_CENTER): Payer: BC Managed Care – PPO

## 2021-03-28 ENCOUNTER — Other Ambulatory Visit: Payer: Self-pay

## 2021-03-28 VITALS — Ht 69.0 in | Wt 215.0 lb

## 2021-03-28 DIAGNOSIS — Z1211 Encounter for screening for malignant neoplasm of colon: Secondary | ICD-10-CM

## 2021-03-28 MED ORDER — PEG-KCL-NACL-NASULF-NA ASC-C 100 G PO SOLR
1.0000 | Freq: Once | ORAL | 0 refills | Status: AC
Start: 2021-03-28 — End: 2021-03-28

## 2021-03-28 NOTE — Progress Notes (Signed)
Pt verified name, DOB, address and insurance during PV today.   Pt mailed instruction packet to include copy of consent form to read and not return, and instructions. PV completed over the phone. Pt encouraged to call with questions or issues.   No allergies to soy or egg Pt is not on blood thinners or diet pills Denies issues with sedation/intubation Denies atrial flutter/fib Denies constipation   Pt is aware of Covid safety and care partner requirements.  

## 2021-04-06 ENCOUNTER — Encounter: Payer: Self-pay | Admitting: Family Medicine

## 2021-04-06 DIAGNOSIS — Z20822 Contact with and (suspected) exposure to covid-19: Secondary | ICD-10-CM | POA: Diagnosis not present

## 2021-04-08 ENCOUNTER — Other Ambulatory Visit: Payer: Self-pay

## 2021-04-08 MED ORDER — ALBUTEROL SULFATE HFA 108 (90 BASE) MCG/ACT IN AERS: 1.0000 | INHALATION_SPRAY | Freq: Four times a day (QID) | RESPIRATORY_TRACT | 0 refills | Status: DC | PRN

## 2021-04-10 ENCOUNTER — Encounter: Payer: Self-pay | Admitting: Gastroenterology

## 2021-04-11 ENCOUNTER — Telehealth: Payer: Self-pay | Admitting: Gastroenterology

## 2021-04-11 NOTE — Telephone Encounter (Signed)
Inbound call from patient requesting to speak with a nurse in regards to he prep for her procedure tomorrow.  Please advise.

## 2021-04-11 NOTE — Telephone Encounter (Signed)
Pt called with concerns about prep. She was wanting to know if she was supposed to drink pouch A and pouch B tonight. I went over prep instructions with pt, and advised pt to stay on clear liquids and to avoid red or purple drinks. Pt verbalized understanding.

## 2021-04-12 ENCOUNTER — Other Ambulatory Visit: Payer: Self-pay

## 2021-04-12 ENCOUNTER — Telehealth: Payer: Self-pay | Admitting: *Deleted

## 2021-04-12 ENCOUNTER — Encounter: Payer: Self-pay | Admitting: Gastroenterology

## 2021-04-12 ENCOUNTER — Encounter: Payer: BC Managed Care – PPO | Admitting: Gastroenterology

## 2021-04-12 ENCOUNTER — Ambulatory Visit: Payer: BC Managed Care – PPO | Admitting: Gastroenterology

## 2021-04-12 VITALS — BP 168/112 | HR 75 | Temp 97.7°F | Ht 69.0 in | Wt 215.0 lb

## 2021-04-12 MED ORDER — FLEET ENEMA 7-19 GM/118ML RE ENEM
1.0000 | ENEMA | Freq: Once | RECTAL | Status: AC
  Administered 2021-04-12: 1 via RECTAL

## 2021-04-12 MED ORDER — SODIUM CHLORIDE 0.9 % IV SOLN: 500.0000 mL | INTRAVENOUS | Status: DC

## 2021-04-12 NOTE — Telephone Encounter (Signed)
Pt arrived for procedure and was not completely cleaned out.  Per Dr. Silverio Decamp she will need to be rescheduled.  Rescheduled procedure for 05/29/21.  Encouraged pt to have an in person PV but she states that with work she doesn't feel she will be able to do that.  Scheduled virtual PV on May 24th at 4:30pm.  Pt states that she would like to try the pills the next time.  Per Dr Silverio Decamp pt will need a 2 day prep.

## 2021-04-30 ENCOUNTER — Other Ambulatory Visit: Payer: Self-pay | Admitting: Family Medicine

## 2021-05-03 DIAGNOSIS — Z20822 Contact with and (suspected) exposure to covid-19: Secondary | ICD-10-CM | POA: Diagnosis not present

## 2021-05-09 DIAGNOSIS — K5900 Constipation, unspecified: Secondary | ICD-10-CM | POA: Diagnosis not present

## 2021-05-09 DIAGNOSIS — Z1211 Encounter for screening for malignant neoplasm of colon: Secondary | ICD-10-CM | POA: Diagnosis not present

## 2021-05-09 DIAGNOSIS — E669 Obesity, unspecified: Secondary | ICD-10-CM | POA: Diagnosis not present

## 2021-05-09 DIAGNOSIS — D509 Iron deficiency anemia, unspecified: Secondary | ICD-10-CM | POA: Diagnosis not present

## 2021-05-10 ENCOUNTER — Encounter (HOSPITAL_COMMUNITY): Payer: Self-pay | Admitting: Emergency Medicine

## 2021-05-10 ENCOUNTER — Other Ambulatory Visit: Payer: Self-pay

## 2021-05-10 ENCOUNTER — Ambulatory Visit (HOSPITAL_COMMUNITY)
Admission: EM | Admit: 2021-05-10 | Discharge: 2021-05-10 | Disposition: A | Discharge status: Home / Self Care | Payer: BC Managed Care – PPO | Attending: Physician Assistant | Admitting: Physician Assistant

## 2021-05-10 DIAGNOSIS — J40 Bronchitis, not specified as acute or chronic: Secondary | ICD-10-CM | POA: Diagnosis not present

## 2021-05-10 DIAGNOSIS — J329 Chronic sinusitis, unspecified: Secondary | ICD-10-CM

## 2021-05-10 DIAGNOSIS — J029 Acute pharyngitis, unspecified: Secondary | ICD-10-CM | POA: Diagnosis not present

## 2021-05-10 DIAGNOSIS — R490 Dysphonia: Secondary | ICD-10-CM

## 2021-05-10 DIAGNOSIS — R059 Cough, unspecified: Secondary | ICD-10-CM

## 2021-05-10 MED ORDER — AMOXICILLIN-POT CLAVULANATE 875-125 MG PO TABS: 1.0000 | ORAL_TABLET | Freq: Two times a day (BID) | ORAL | 0 refills | Status: DC

## 2021-05-10 MED ORDER — PREDNISONE 20 MG PO TABS: 40.0000 mg | ORAL_TABLET | Freq: Every day | ORAL | 0 refills | Status: AC
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
## 2021-05-10 NOTE — Discharge Instructions (Addendum)
I have called in an antibiotic to treat sinus infection.  Take this twice a day for 1 week.  I have also called in prednisone to help with your symptoms.  You should take 2 tablets in the morning for 4 days.  While you are taking this do not take NSAIDs (aspirin, ibuprofen/Advil, naproxen/Aleve).  You can use Tylenol if needed.  Make sure you are drinking plenty of fluid.  I also recommended Mucinex and Flonase for symptom relief.  If your symptoms do not improve after this medication regimen I would recommend following up with ENT.  If anything worsens please return for reevaluation.

## 2021-05-10 NOTE — ED Triage Notes (Signed)
Pt presents with sore throat xs 2 weeks. States tested for COVID last week with a negative test result.

## 2021-05-16 ENCOUNTER — Telehealth: Payer: Self-pay | Admitting: Family Medicine

## 2021-05-16 NOTE — Telephone Encounter (Signed)
Pt was told by her Gastroenterologist who is doing her colonoscopy-that her blood count was low. I tried to figure out what blood test she was talking about. She did not know.  I offered her an appointment, but she is wanting a cb.

## 2021-05-17 NOTE — Telephone Encounter (Signed)
Returned patients call per patient she wanted to inform Dr. Ethelene Hal of low levels on blood work that gastrologist did. I asked patient if they were still going to preform colonoscopy she wasn't sure but said she would check with her gastro doctor and call us back.

## 2021-05-17 NOTE — Telephone Encounter (Signed)
Pt called back and said they said the blood count shouldn't affect the colonoscopy and that they are going to continue with it, she did say that they would have her stop taking iron supplements until it is done and that she would continue after

## 2021-05-24 DIAGNOSIS — K635 Polyp of colon: Secondary | ICD-10-CM | POA: Diagnosis not present

## 2021-05-24 DIAGNOSIS — D509 Iron deficiency anemia, unspecified: Secondary | ICD-10-CM | POA: Diagnosis not present

## 2021-05-24 DIAGNOSIS — Z1211 Encounter for screening for malignant neoplasm of colon: Secondary | ICD-10-CM | POA: Diagnosis not present

## 2021-05-24 DIAGNOSIS — D122 Benign neoplasm of ascending colon: Secondary | ICD-10-CM | POA: Diagnosis not present

## 2021-05-29 ENCOUNTER — Encounter: Payer: BC Managed Care – PPO | Admitting: Gastroenterology

## 2021-05-31 ENCOUNTER — Encounter: Payer: Self-pay | Admitting: Family Medicine

## 2021-06-07 ENCOUNTER — Telehealth: Payer: Self-pay

## 2021-06-07 DIAGNOSIS — I10 Essential (primary) hypertension: Secondary | ICD-10-CM

## 2021-06-07 MED ORDER — VALSARTAN-HYDROCHLOROTHIAZIDE 320-25 MG PO TABS: 0.5000 | ORAL_TABLET | Freq: Every day | ORAL | 2 refills | Status: DC

## 2021-06-07 NOTE — Telephone Encounter (Signed)
-----   Message from Bard Herbert sent at 06/07/2021 11:12 AM EDT ----- Regarding: cardiac medication refill Patient says she needs refills on her Cardiac Medication. Patient not sure if she needs a follow up visit, if so contact pt to schedule. Thank you

## 2021-06-07 NOTE — Telephone Encounter (Signed)
Patient calling states that's  she still has a sore throat x 2 weeks she was seen at urgent care on 05/22/21 they gave Amoxil patient had to stop taking for colonoscopy then started back with it. She's not sure if she need another round of antibiotics. Please advise

## 2021-06-07 NOTE — Telephone Encounter (Signed)
Appointment scheduled.

## 2021-06-10 ENCOUNTER — Encounter: Payer: Self-pay | Admitting: Family Medicine

## 2021-06-10 ENCOUNTER — Ambulatory Visit: Payer: BC Managed Care – PPO | Admitting: Family Medicine

## 2021-06-10 ENCOUNTER — Other Ambulatory Visit: Payer: Self-pay

## 2021-06-10 VITALS — BP 136/78 | HR 91 | Temp 97.7°F | Ht 69.0 in | Wt 219.6 lb

## 2021-06-10 DIAGNOSIS — W503XXA Accidental bite by another person, initial encounter: Secondary | ICD-10-CM | POA: Diagnosis not present

## 2021-06-10 DIAGNOSIS — D509 Iron deficiency anemia, unspecified: Secondary | ICD-10-CM

## 2021-06-10 DIAGNOSIS — Z0000 Encounter for general adult medical examination without abnormal findings: Secondary | ICD-10-CM | POA: Diagnosis not present
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
## 2021-06-11 LAB — URINALYSIS, ROUTINE W REFLEX MICROSCOPIC
Bilirubin Urine: NEGATIVE
Hgb urine dipstick: NEGATIVE
Ketones, ur: NEGATIVE
Leukocytes,Ua: NEGATIVE
Nitrite: NEGATIVE
RBC / HPF: NONE SEEN (ref 0–?)
Specific Gravity, Urine: >=1.03 — AB (ref 1.000–1.030)
Total Protein, Urine: NEGATIVE
Urine Glucose: NEGATIVE
Urobilinogen, UA: 0.2 (ref 0.0–1.0)
pH: 5.5 (ref 5.0–8.0)

## 2021-06-11 LAB — IRON,TIBC AND FERRITIN PANEL
%SAT: 10 % (calc) — ABNORMAL LOW (ref 16–45)
Ferritin: 12 ng/mL — ABNORMAL LOW (ref 16–232)
Iron: 41 ug/dL (ref 40–190)
TIBC: 413 mcg/dL (calc) (ref 250–450)

## 2021-06-11 LAB — COMPREHENSIVE METABOLIC PANEL
ALT: 11 U/L (ref 0–35)
AST: 13 U/L (ref 0–37)
Albumin: 4.3 g/dL (ref 3.5–5.2)
Alkaline Phosphatase: 70 U/L (ref 39–117)
BUN: 18 mg/dL (ref 6–23)
CO2: 28 mEq/L (ref 19–32)
Calcium: 9.5 mg/dL (ref 8.4–10.5)
Chloride: 102 mEq/L (ref 96–112)
Creatinine, Ser: 0.81 mg/dL (ref 0.40–1.20)
GFR: 87.26 mL/min (ref >60.00)
Glucose, Bld: 104 mg/dL — ABNORMAL HIGH (ref 70–99)
Potassium: 3.4 mEq/L — ABNORMAL LOW (ref 3.5–5.1)
Sodium: 140 mEq/L (ref 135–145)
Total Bilirubin: 0.2 mg/dL (ref 0.2–1.2)
Total Protein: 7.4 g/dL (ref 6.0–8.3)

## 2021-06-11 LAB — CBC
HCT: 34.6 % — ABNORMAL LOW (ref 36.0–46.0)
Hemoglobin: 11.2 g/dL — ABNORMAL LOW (ref 12.0–15.0)
MCHC: 32.4 g/dL (ref 30.0–36.0)
MCV: 80 fl (ref 78.0–100.0)
Platelets: 105 10*3/uL — ABNORMAL LOW (ref 150.0–400.0)
RBC: 4.33 Mil/uL (ref 3.87–5.11)
RDW: 17.2 % — ABNORMAL HIGH (ref 11.5–15.5)
WBC: 4.9 10*3/uL (ref 4.0–10.5)

## 2021-06-11 LAB — LDL CHOLESTEROL, DIRECT: Direct LDL: 136 mg/dL

## 2021-06-11 LAB — HEMOGLOBIN A1C: Hgb A1c MFr Bld: 6.1 % (ref 4.6–6.5)

## 2021-07-18 ENCOUNTER — Telehealth (HOSPITAL_COMMUNITY): Payer: Self-pay

## 2021-07-18 NOTE — Telephone Encounter (Signed)
A request from [facility] has been requested for documents for the patient however, I faxed them to inform them that the patient's care is not being managed in this office to the number [phone]. GA, RMA

## 2021-07-29 ENCOUNTER — Encounter: Payer: Self-pay | Admitting: Family Medicine

## 2021-07-29 DIAGNOSIS — D508 Other iron deficiency anemias: Secondary | ICD-10-CM

## 2021-07-29 MED ORDER — FERROUS SULFATE 325 (65 FE) MG PO TABS: 325.0000 mg | ORAL_TABLET | Freq: Three times a day (TID) | ORAL | 3 refills | Status: DC

## 2021-07-29 NOTE — Addendum Note (Signed)
Addended by: Jon Billings on: 07/29/2021 12:47 PM   Modules accepted: Orders

## 2021-08-02 ENCOUNTER — Telehealth (HOSPITAL_COMMUNITY): Payer: Self-pay

## 2021-08-02 ENCOUNTER — Other Ambulatory Visit (HOSPITAL_COMMUNITY): Payer: Self-pay

## 2021-08-02 NOTE — Telephone Encounter (Signed)
[facility] sent another request for patient's records however, her care is not managed in our office. I tried calling their office at [phone] and was unable to reach anyone and another faxed was sent of the above information.

## 2021-08-09 ENCOUNTER — Telehealth (HOSPITAL_COMMUNITY): Payer: Self-pay

## 2021-08-09 NOTE — Telephone Encounter (Signed)
Spoke to Mickel Baas with [facility] at [phone],  because the patient's care has not been managed in our office. Per her request I faxed over 2 separate faxes to [phone] of the above information.

## 2021-08-15 DIAGNOSIS — Z20822 Contact with and (suspected) exposure to covid-19: Secondary | ICD-10-CM | POA: Diagnosis not present

## 2021-08-16 ENCOUNTER — Encounter (HOSPITAL_COMMUNITY): Payer: Self-pay | Admitting: Emergency Medicine

## 2021-08-16 ENCOUNTER — Ambulatory Visit (HOSPITAL_COMMUNITY)
Admission: EM | Admit: 2021-08-16 | Discharge: 2021-08-16 | Disposition: A | Discharge status: Home / Self Care | Payer: BC Managed Care – PPO | Attending: Emergency Medicine | Admitting: Emergency Medicine

## 2021-08-16 ENCOUNTER — Encounter: Payer: Self-pay | Admitting: Nurse Practitioner

## 2021-08-16 ENCOUNTER — Other Ambulatory Visit: Payer: Self-pay

## 2021-08-16 DIAGNOSIS — R059 Cough, unspecified: Secondary | ICD-10-CM

## 2021-08-16 DIAGNOSIS — I10 Essential (primary) hypertension: Secondary | ICD-10-CM | POA: Diagnosis not present

## 2021-08-16 MED ORDER — BENZONATATE 200 MG PO CAPS: 200.0000 mg | ORAL_CAPSULE | Freq: Three times a day (TID) | ORAL | 0 refills | Status: AC | PRN

## 2021-08-16 NOTE — ED Triage Notes (Signed)
Pt reports for past couple days had cough, fever intermittently, sore throat.
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
## 2021-08-16 NOTE — Discharge Instructions (Addendum)
Begin Tessalon Perles as prescribed.  Follow-up with your primary care physician as soon as possible regarding your elevated blood pressure to ensure that you are taking the correct medications correctly.  If you wish to return for a strep test at a later time you are welcome to do so.

## 2021-08-20 ENCOUNTER — Telehealth (INDEPENDENT_AMBULATORY_CARE_PROVIDER_SITE_OTHER): Payer: BC Managed Care – PPO | Admitting: Family Medicine

## 2021-08-20 ENCOUNTER — Encounter: Payer: Self-pay | Admitting: Family Medicine

## 2021-08-20 DIAGNOSIS — U071 COVID-19: Secondary | ICD-10-CM
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
## 2021-08-20 NOTE — Patient Instructions (Addendum)
   ---------------------------------------------------------------------------------------------------------------------------      WORK SLIP:  Patient Leslie [name],  1976-04-11, was seen for a medical visit today, 08/20/21 . Please excuse from work for a COVID like illness. We advise 10 days minimum from the onset of symptoms (08/13/21) PLUS 1 day of no fever and improved symptoms. Will defer to employer for a sooner return to work if symptoms have resolved, it is greater than 5 days since the positive test and the patient can wear a high-quality, tight fitting mask such as N95 or KN95 at all times for an additional 5 days. Would also suggest COVID19 antigen testing is negative prior to return.  Sincerely: E-signature: Dr. Colin Benton, DO [facility] Ph: [phone]   ------------------------------------------------------------------------------------------------------------------------------  -nasal saline twice daily  -Afrin nasal stray for 3 days  -warm herbal tea without caffeine, drink plenty of fluids  -avoid dairy  -if amoxicillin upsets the stomach ok to stop. Seek inperson evaluation for repeat strep swab if symptoms recur or persist.   I hope you are feeling better soon!  Seek in person care promptly if your symptoms worsen, new concerns arise or you are not improving with treatment.  It was nice to meet you today. I help [name] out with telemedicine visits on Tuesdays and Thursdays and am available for visits on those days. If you have any concerns or questions following this visit please schedule a follow up visit with your Primary Care doctor or seek care at a local urgent care clinic to avoid delays in care.

## 2021-09-03 ENCOUNTER — Encounter: Payer: Self-pay | Admitting: Family Medicine

## 2021-09-03 ENCOUNTER — Telehealth (INDEPENDENT_AMBULATORY_CARE_PROVIDER_SITE_OTHER): Payer: BC Managed Care – PPO | Admitting: Family Medicine

## 2021-09-03 ENCOUNTER — Telehealth: Payer: BC Managed Care – PPO | Admitting: Family Medicine

## 2021-09-03 DIAGNOSIS — R0600 Dyspnea, unspecified: Secondary | ICD-10-CM | POA: Diagnosis not present

## 2021-09-03 DIAGNOSIS — J029 Acute pharyngitis, unspecified: Secondary | ICD-10-CM | POA: Diagnosis not present

## 2021-09-03 DIAGNOSIS — R059 Cough, unspecified: Secondary | ICD-10-CM

## 2021-09-03 DIAGNOSIS — J45909 Unspecified asthma, uncomplicated: Secondary | ICD-10-CM | POA: Diagnosis not present

## 2021-09-03 DIAGNOSIS — R0602 Shortness of breath: Secondary | ICD-10-CM | POA: Diagnosis not present
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
## 2021-09-03 NOTE — Patient Instructions (Signed)
Please go get checked out in person today as we discussed.  I hope you are feeling better soon!  It was nice to meet you today. I help [facility] out with telemedicine visits on Tuesdays and Thursdays and am available for visits on those days. If you have any concerns or questions following this visit please schedule a follow up visit with your Primary Care doctor or seek care at a local urgent care clinic to avoid delays in care.        ---------------------------------------------------------------------------------------------------------------------------    WORK SLIP:  Patient Leslie [name],  29-Oct-1976, was seen for a medical visit today, 09/03/21 . Please excuse from work today as we have advised her to go to Urgent care this evening for further evaluation.   Sincerely: E-signature: Dr. Colin Benton, DO [facility] Ph: [phone]   ------------------------------------------------------------------------------------------------------------------------------

## 2021-09-10 ENCOUNTER — Ambulatory Visit
Admission: RE | Admit: 2021-09-10 | Discharge: 2021-09-10 | Disposition: A | Discharge status: Home / Self Care | Payer: BC Managed Care – PPO | Source: Ambulatory Visit | Attending: Emergency Medicine | Admitting: Emergency Medicine

## 2021-09-10 ENCOUNTER — Other Ambulatory Visit: Payer: Self-pay

## 2021-09-10 VITALS — BP 169/97 | HR 81 | Temp 98.3°F | Resp 18

## 2021-09-10 DIAGNOSIS — N921 Excessive and frequent menstruation with irregular cycle: Secondary | ICD-10-CM

## 2021-09-10 MED ORDER — NORETHINDRONE ACETATE 5 MG PO TABS: 10.0000 mg | ORAL_TABLET | Freq: Every day | ORAL | 0 refills | Status: DC
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
## 2021-09-10 NOTE — ED Triage Notes (Signed)
Patient has had vaginal bleeding at 2 week increments since august.  Patient did not skip any cycles prior to this vaginal bleeding.  Patient intermittently has abdominal pain and treats with ibuprofen.  Patient reports the bleeding is a lot heavier than a normal period.  This episode started yesterday

## 2021-09-10 NOTE — Discharge Instructions (Signed)
If heavy and frequent vaginal bleeding continues, you will need to see a gynecologist for further treatment

## 2021-09-18 ENCOUNTER — Other Ambulatory Visit: Payer: Self-pay

## 2021-09-18 MED ORDER — AMLODIPINE BESYLATE 10 MG PO TABS: 5.0000 mg | ORAL_TABLET | Freq: Every day | ORAL | 1 refills | Status: DC

## 2021-09-18 MED ORDER — LABETALOL HCL 200 MG PO TABS: ORAL_TABLET | ORAL | 1 refills | Status: DC

## 2021-09-18 NOTE — Telephone Encounter (Signed)
Refill sent to pharmacy.   

## 2021-09-25 ENCOUNTER — Telehealth: Payer: BC Managed Care – PPO | Admitting: Physician Assistant

## 2021-09-25 ENCOUNTER — Telehealth (HOSPITAL_COMMUNITY): Payer: Self-pay | Admitting: Emergency Medicine

## 2021-09-25 DIAGNOSIS — R109 Unspecified abdominal pain: Secondary | ICD-10-CM

## 2021-09-25 NOTE — Telephone Encounter (Signed)
Patient called and states she has a f/u appointment with Gynecologist on Tuesday, but she has been trying to stretch out the norethindrone pills that were prescribed and has been taking them every other day.  Bleeding then became more severe today and she did not take it yesterday.   Reviewed with Levada Dy, APP, who asked that patient be screened for any concerning symptoms for significant blood loss prior to refilling.   Attempted to reach patient x 1, LVM

## 2021-09-25 NOTE — Progress Notes (Signed)
Based on what you shared with me, I feel your condition warrants further evaluation and I recommend that you be seen in a face to face visit.  Giving belly pain along with vaginal symptoms, you will need to follow-up with your PCP or be seen at a local Urgent Care so you can get an abdominal examination to make sure you get proper diagnosis and treatment and to make sure nothing is being missed.    NOTE: There will be NO CHARGE for this eVisit   If you are having a true medical emergency please call 911.      For an urgent face to face visit, [facility] has six urgent care centers for your convenience:     [facility] [place] Get Driving Directions [phone] [place], [place] [address]    [facility]) Get Driving Directions [phone] [place], [place] [address]  [facility] [place] [facility]) Get Driving Directions [phone] [address] [place],  [place]  [address]  [facility] Get Driving Directions [phone] [address] [place], [place], [place] [address]   [facility] Get Driving Directions  [phone] [address] [place], [place] [address]   [facility] [place] Get Driving Directions [phone] [address], [place], [place] [address]  Your MyChart E-visit questionnaire answers were reviewed by a board certified advanced clinical practitioner to complete your personal care plan based on your specific symptoms.  Thank you for using e-Visits.

## 2021-09-26 ENCOUNTER — Telehealth (HOSPITAL_COMMUNITY): Payer: Self-pay | Admitting: Emergency Medicine

## 2021-09-26 ENCOUNTER — Ambulatory Visit: Payer: BC Managed Care – PPO

## 2021-09-26 MED ORDER — NORETHINDRONE ACETATE 5 MG PO TABS: 10.0000 mg | ORAL_TABLET | Freq: Every day | ORAL | 0 refills | Status: DC

## 2021-09-26 NOTE — Telephone Encounter (Signed)
Patient left voicemail overnight, attempted to return call today, LVM

## 2021-09-26 NOTE — Telephone Encounter (Signed)
Additional notes did not save to previous telephone encounter.  PAtient returned call, denied shortness of breath, severe fatigue, light-headedness, or tachycardia/palpitations.  She states the medication was helping before she began taking it every other day.  Encouraged patient to begin using as prescribed again, sent refill, okay'd by Levada Dy, APP.
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
## 2021-10-01 ENCOUNTER — Telehealth: Payer: Self-pay | Admitting: Family Medicine

## 2021-10-01 DIAGNOSIS — I10 Essential (primary) hypertension: Secondary | ICD-10-CM | POA: Diagnosis not present

## 2021-10-01 DIAGNOSIS — N921 Excessive and frequent menstruation with irregular cycle: Secondary | ICD-10-CM | POA: Diagnosis not present

## 2021-10-01 DIAGNOSIS — Z0000 Encounter for general adult medical examination without abnormal findings: Secondary | ICD-10-CM

## 2021-10-01 MED ORDER — POTASSIUM CHLORIDE CRYS ER 20 MEQ PO TBCR
20.0000 meq | EXTENDED_RELEASE_TABLET | Freq: Once | ORAL | 0 refills | Status: DC
Start: 2021-10-01 — End: 2021-10-03

## 2021-10-01 NOTE — Telephone Encounter (Signed)
What is the name of the medication? potassium chloride SA (KLOR-CON) 20 MEQ tablet [[id number]]   Have you contacted your pharmacy to request a refill? Pt is wanting a script for this. I informed her Dr. Ethelene Hal had never filled this and she should call that provider who did. She still wanted a message put in.   Which pharmacy would you like this sent to? [id number] [place], [place] [address] [place]  [address], [place] [address]  Phone:  [phone]  Fax:  [phone]  DEA #:  [id number]   Patient notified that their request is being sent to the clinical staff for review and that they should receive a call once it is complete. If they do not receive a call within 72 hours they can check with their pharmacy or our office.

## 2021-10-03 ENCOUNTER — Telehealth: Payer: BC Managed Care – PPO | Admitting: Physician Assistant

## 2021-10-03 ENCOUNTER — Encounter: Payer: Self-pay | Admitting: Cardiology

## 2021-10-03 ENCOUNTER — Telehealth (INDEPENDENT_AMBULATORY_CARE_PROVIDER_SITE_OTHER): Payer: BC Managed Care – PPO | Admitting: Cardiology

## 2021-10-03 VITALS — BP 135/80 | Ht 70.0 in | Wt 215.0 lb

## 2021-10-03 DIAGNOSIS — S6991XA Unspecified injury of right wrist, hand and finger(s), initial encounter: Secondary | ICD-10-CM

## 2021-10-03 DIAGNOSIS — E7800 Pure hypercholesterolemia, unspecified: Secondary | ICD-10-CM

## 2021-10-03 DIAGNOSIS — I10 Essential (primary) hypertension: Secondary | ICD-10-CM

## 2021-10-03 DIAGNOSIS — Z91199 Patient's noncompliance with other medical treatment and regimen due to unspecified reason: Secondary | ICD-10-CM

## 2021-10-03 DIAGNOSIS — T148XXA Other injury of unspecified body region, initial encounter: Secondary | ICD-10-CM

## 2021-10-03 DIAGNOSIS — Z0000 Encounter for general adult medical examination without abnormal findings: Secondary | ICD-10-CM

## 2021-10-03 MED ORDER — POTASSIUM CHLORIDE CRYS ER 20 MEQ PO TBCR
20.0000 meq | EXTENDED_RELEASE_TABLET | Freq: Two times a day (BID) | ORAL | 3 refills | Status: DC
Start: 2021-10-03 — End: 2023-03-16

## 2021-10-03 MED ORDER — MUPIROCIN CALCIUM 2 % EX CREA: TOPICAL_CREAM | CUTANEOUS | 0 refills | Status: DC

## 2021-10-03 NOTE — Patient Instructions (Signed)
Medication Instructions:  Refill sent to the pharmacy electronically.  *If you need a refill on your cardiac medications before your next appointment, please call your pharmacy*   Lab Work: Your physician recommends that you return for lab work in:2 [place] If you have labs (blood work) drawn today and your tests are completely normal, you will receive your results only by: [place] (if you have MyChart) OR A paper copy in the mail If you have any lab test that is abnormal or we need to change your treatment, we will call you to review the results.   Follow-Up: At [facility], you and your health needs are our priority.  As part of our continuing mission to provide you with exceptional heart care, we have created designated Provider Care Teams.  These Care Teams include your primary Cardiologist (physician) and Advanced Practice Providers (APPs -  Physician Assistants and Nurse Practitioners) who all work together to provide you with the care you need, when you need it.  We recommend signing up for the patient portal called "MyChart".  Sign up information is provided on this After Visit Summary.  MyChart is used to connect with patients for Virtual Visits (Telemedicine).  Patients are able to view lab/test results, encounter notes, upcoming appointments, etc.  Non-urgent messages can be sent to your provider as well.   To learn more about what you can do with MyChart, go to [email or web address].    Your next appointment:   6 month(s)  The format for your next appointment:   In Person  Provider:   You will see one of the following Advanced Practice Providers on your designated Care Team:   Sande Rives, PA-C Coletta Memos, FNP  Then, Kirk Ruths, MD will plan to see you again in 12 month(s).
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
## 2021-10-03 NOTE — Patient Instructions (Signed)
Leslie [name], thank you for joining [name], PA-C for today's virtual visit.  While this provider is not your primary care provider (PCP), if your PCP is located in our provider database this encounter information will be shared with them immediately following your visit.  Consent: (Patient) Leslie [name] provided verbal consent for this virtual visit at the beginning of the encounter.  Current Medications:  Current Outpatient Medications:    mupirocin cream (BACTROBAN) 2 %, Apply thin layer topically to wound 2 x daily for 5 days., Disp: 15 g, Rfl: 0   albuterol (VENTOLIN HFA) 108 (90 Base) MCG/ACT inhaler, INHALE 1-2 PUFFS BY MOUTH EVERY 6 HOURS AS NEEDED FOR WHEEZE OR SHORTNESS OF BREATH, Disp: 1 each, Rfl: 0   amLODipine (NORVASC) 10 MG tablet, Take 0.5 tablets (5 mg total) by mouth daily., Disp: 60 tablet, Rfl: 1   Ascorbic Acid (VITAMIN C) 100 MG tablet, Take 100 mg by mouth daily., Disp: , Rfl:    cetirizine (ZYRTEC) 10 MG tablet, TAKE 1 TABLET BY MOUTH EVERY DAY, Disp: 30 tablet, Rfl: 0   ferrous sulfate 325 (65 FE) MG tablet, Take 1 tablet (325 mg total) by mouth 3 (three) times daily with meals., Disp: 90 tablet, Rfl: 3   labetalol (NORMODYNE) 200 MG tablet, Take labetalol 100mg  every morning and 200mg  every evening, Disp: 60 tablet, Rfl: 1   meloxicam (MOBIC) 15 MG tablet, Take 1 tablet (15 mg total) by mouth daily as needed for pain. With food., Disp: 30 tablet, Rfl: 0   Multiple Vitamins-Minerals (AIRBORNE PO), Take by mouth., Disp: , Rfl:    norethindrone (AYGESTIN) 5 MG tablet, Take 2 tablets (10 mg total) by mouth daily., Disp: 20 tablet, Rfl: 0   ondansetron (ZOFRAN-ODT) 8 MG disintegrating tablet, Take 8 mg by mouth 3 (three) times daily as needed. (Patient not taking: No sig reported), Disp: , Rfl:    PARoxetine (PAXIL-CR) 25 MG 24 hr tablet, TAKE 1 TABLET BY MOUTH EVERY DAY (Patient not taking: No sig reported), Disp: 90 tablet, Rfl: 1   potassium chloride  SA (KLOR-CON) 20 MEQ tablet, Take 1 tablet (20 mEq total) by mouth 2 (two) times daily. Pt states tablet is too big and she cuts tablet in in 1/4, Disp: 180 tablet, Rfl: 3   valsartan-hydrochlorothiazide (DIOVAN-HCT) 320-25 MG tablet, Take 0.5 tablets by mouth daily. Refills with PCP, Disp: 45 tablet, Rfl: 2  Current Facility-Administered Medications:    0.9 %  sodium chloride infusion, 500 mL, Intravenous, Continuous, Nandigam, Kavitha V, MD   Medications ordered in this encounter:  Meds ordered this encounter  Medications   mupirocin cream (BACTROBAN) 2 %    Sig: Apply thin layer topically to wound 2 x daily for 5 days.    Dispense:  15 g    Refill:  0    Order Specific Question:   Supervising Provider    Answer:   Sabra Heck, [place]     *If you need refills on other medications prior to your next appointment, please contact your pharmacy*  Follow-Up: Call back or seek an in-person evaluation if the symptoms worsen or if the condition fails to improve as anticipated.  Other Instructions Please keep skin clean and dry as discussed during visit. You can apply a very thin layer of the prescription cream to the area 1-2 x daily for a few days. Keep the area bandaged when out and about. Alternate tylenol and ibuprofen for pain.  For any worsening or  new symptoms, you need to be seen in person either at PCP office or local urgent care. Do not delay care.     If you have been instructed to have an in-person evaluation today at a local Urgent Care facility, please use the link below. It will take you to a list of all of our available [facility], including address, phone number and hours of operation. Please do not delay care.  [facility]  If you or a family member do not have a primary care provider, use the link below to schedule a visit and establish care. When you choose a [facility] primary care physician or advanced practice provider, you gain a  long-term partner in health. Find a Primary Care Provider  Learn more about [facility] in-office and virtual care options: [facility]

## 2021-10-04 ENCOUNTER — Other Ambulatory Visit: Payer: Self-pay | Admitting: Physician Assistant

## 2021-10-04 MED ORDER — MUPIROCIN 2 % EX OINT: 1.0000 "application " | TOPICAL_OINTMENT | Freq: Two times a day (BID) | CUTANEOUS | 0 refills | Status: DC

## 2021-10-09 DIAGNOSIS — I10 Essential (primary) hypertension: Secondary | ICD-10-CM | POA: Diagnosis not present

## 2021-10-09 DIAGNOSIS — D251 Intramural leiomyoma of uterus: Secondary | ICD-10-CM | POA: Diagnosis not present

## 2021-10-09 DIAGNOSIS — N921 Excessive and frequent menstruation with irregular cycle: Secondary | ICD-10-CM | POA: Diagnosis not present

## 2021-10-09 DIAGNOSIS — D252 Subserosal leiomyoma of uterus: Secondary | ICD-10-CM | POA: Diagnosis not present

## 2021-10-10 ENCOUNTER — Other Ambulatory Visit: Payer: Self-pay | Admitting: Cardiology

## 2021-10-11 ENCOUNTER — Telehealth: Payer: BC Managed Care – PPO | Admitting: Physician Assistant

## 2021-10-11 DIAGNOSIS — M722 Plantar fascial fibromatosis: Secondary | ICD-10-CM | POA: Diagnosis not present

## 2021-10-11 DIAGNOSIS — M79672 Pain in left foot: Secondary | ICD-10-CM
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
## 2021-10-11 NOTE — Patient Instructions (Addendum)
[facility]   [address]  [place], [place] [address]   GET DIRECTIONS   CONTACT   Phone: [phone]   Phone: [phone]   URGENT CARE HOURS   Monday - Friday:   8:00am to 8:00pm   Saturday:   10:00am to 3:00pm         [facility]   [address]  [place], [place] [address]   GET DIRECTIONS   CONTACT   Phone: [place]   Monday:   9:00AM - 9:00PM   Tuesday:   9:00AM - 9:00PM   Wednesday:   9:00AM - 9:00PM   Thursday:   9:00AM - 9:00PM   Friday:   9:00AM - 9:00PM   Saturday:   9:00AM - 9:00PM   Sunday:   9:00AM - 9:00PM   HOLIDAY HOURS   Holidays:   8:30AM - 4:30PM      Jamal Maes   [facility] [place] [phone]      [facility] [address] [place], [place] [address]      EVENINGS & WEEKENDS NO APPOINTMENT NECESSARY Mon-Fri 5:30PM - 9PM    Sat 9 AM - 2 PM Sun 10 AM - 2 PM      [facility]   [address]  [address]  [place], [place] [address]   [phone]      OFFICE HOURS:   [place] : 8:00 A.M. - 4:00 P.M.      [facility]  [place], [place] [address]   [phone]      OFFICE HOURS:   [place]: 8:00 A.M. - 8:30 P.M.   SATURDAY: 10:00 A.M. - 2:00 P.M.      Posterior Tibial Tendinitis Posterior tibial tendinitis is irritation of a tendon called the posterior tibial tendon. Your posterior tibial tendon is a cord-like tissue that connects bones of your lower leg and foot to a muscle that: Supports your arch. Helps you raise up on your toes. Helps you turn your foot down and in. This condition causes foot and ankle pain. It can also lead to a flat foot. What are the causes? This condition is most often caused by repeated stress to the tendon (overuse injury). It can also be caused by a sudden  injury that stresses the tendon, such as landing on your foot after jumping or falling. What increases the risk? This condition is more likely to develop in: People who play a sport that involves putting a lot of pressure on the feet, such as: Basketball. Tennis. Soccer. Hockey. Runners. Females who are older than 46 years of age and are overweight. People with diabetes. People with decreased foot stability. People with flat feet. What are the signs or symptoms? Symptoms include: Pain in the inner ankle. Pain at the arch of your foot. Pain that gets worse with running, walking, or standing. Swelling on the inside of your ankle and foot. Weakness in your ankle or foot. Inability to stand up on tiptoe. Flattening of the arch of your foot. How is this diagnosed? This condition may be diagnosed based on: Your symptoms. Your medical history. A physical exam. Tests, such as: X-ray. MRI. Ultrasound. How is this treated? This condition may be treated by: Putting ice to the injured area. Taking NSAIDs, such as ibuprofen, to reduce pain and swelling. Wearing a special shoe or shoe insert to support your arch (orthotic). Having physical therapy. Replacing  high-impact exercise with low-impact exercise, such as swimming or cycling. If your symptoms do not improve with these treatments, you may need to wear a splint, removable walking boot, or short leg cast for 6-8 weeks to keep your foot and ankle still (immobilized). Follow these instructions at home: If you have a cast, splint, or boot: Keep it clean and dry. Check the skin around it every day. Tell your health care provider about any concerns. If you have a cast: Do not stick anything inside it to scratch your skin. Doing that increases your risk of infection. You may put lotion on dry skin around the edges of the cast. Do not put lotion on the skin underneath the cast. If you have a splint or boot: Wear it as told by your  health care provider. Remove it only as told by your health care provider. Loosen it if your toes tingle, become numb, or turn cold and blue. Bathing Do not take baths, swim, or use a hot tub until your health care provider approves. Ask your health care provider if you may take showers. If your cast, splint, or boot is not waterproof: Do not let it get wet. Cover it with a waterproof covering while you take a bath or a shower. Managing pain and swelling  If directed, put ice on the injured area. If you have a removable splint or boot, remove it as told by your health care provider. Put ice in a plastic bag. Place a towel between your skin and the bag or between your cast and the bag. Leave the ice on for 20 minutes, 2-3 times a day. Move your toes often to reduce stiffness and swelling. Raise (elevate) the injured area above the level of your heart while you are sitting or lying down. Activity Do not use the injured foot to support your body weight until your health care provider says that you can. Use crutches as told by your health care provider. Do not do activities that make pain or swelling worse. Ask your health care provider when it is safe to drive if you have a cast, splint, or boot on your foot. Return to your normal activities as told by your health care provider. Ask your health care provider what activities are safe for you. Do exercises as told by your health care provider. General instructions Take over-the-counter and prescription medicines only as told by your health care provider. If you have an orthotic, use it as told by your health care provider. Keep all follow-up visits as told by your health care provider. This is important. How is this prevented? Wear footwear that is appropriate to your athletic activity. Avoid athletic activities that cause pain or swelling in your ankle or foot. Before being active, do range-of-motion and stretching exercises. If you develop  pain or swelling while training, stop training. If you have pain or swelling that does not improve after a few days of rest, see your health care provider. If you start a new athletic activity, start gradually so you can build up your strength and flexibility. Contact a health care provider if: Your symptoms get worse. Your symptoms do not improve in 6-8 weeks. You develop new, unexplained symptoms. Your splint, boot, or cast gets damaged. Summary Posterior tibial tendinitis is irritation of a tendon called the posterior tibial tendon. This condition is most often caused by repeated stress to the tendon (overuse injury). This condition causes foot pain and ankle pain. It can also lead to  a flat foot. This condition may be treated by not doing high-impact activities, applying ice, having physical therapy, wearing orthotics, and wearing a cast, splint, or boot if needed. This information is not intended to replace advice given to you by your health care provider. Make sure you discuss any questions you have with your health care provider. Document Revised: 03/29/2019 Document Reviewed: 02/03/2019 Elsevier Patient Education  [place].  Posterior Tibial Tendinitis Rehab Ask your health care provider which exercises are safe for you. Do exercises exactly as told by your health care provider and adjust them as directed. It is normal to feel mild stretching, pulling, tightness, or discomfort as you do these exercises. Stop right away if you feel sudden pain or your pain gets worse. Do not begin these exercises until told by your health care provider. Stretching and range-of-motion exercises These exercises warm up your muscles and joints and improve the movement and flexibility in your ankle and foot. These exercises may also help to relieve pain. Standing wall calf stretch, knee straight  Stand with your hands against a wall. Extend your left / right leg behind you, and bend your front  knee slightly. If directed, place a folded washcloth under the arch of your foot for support. Point the toes of your back foot slightly inward. Keeping your heels on the floor and your back knee straight, shift your weight toward the wall. Do not allow your back to arch. You should feel a gentle stretch in your upper left / right calf. Hold this position for __________ seconds. Repeat __________ times. Complete this exercise __________ times a day. Standing wall calf stretch, knee bent Stand with your hands against a wall. Extend your left / right leg behind you, and bend your front knee slightly. If directed, place a folded washcloth under the arch of your foot for support. Point the toes of your back foot slightly inward. Unlock your back knee so it is bent. Keep your heels on the floor. You should feel a gentle stretch deep in your lower left / right calf. Hold this position for __________ seconds. Repeat __________ times. Complete this exercise __________ times a day. Strengthening exercises These exercises build strength and endurance in your ankle and foot. Endurance is the ability to use your muscles for a long time, even after they get tired. Ankle inversion with band Secure one end of a rubber exercise band or tubing to a fixed object, such as a table leg or a pole, that will stay still when the band is pulled. Loop the other end of the band around the middle of your left / right foot. Sit on the floor facing the object with your left / right leg extended. The band or tube should be slightly tense when your foot is relaxed. Leading with your big toe, slowly bring your left / right foot and ankle inward, toward your other foot (inversion). Hold this position for __________ seconds. Slowly return your foot to the starting position. Repeat __________ times. Complete this exercise __________ times a day. Towel curls  Sit in a chair on a non-carpeted surface, and put your feet on the  floor. Place a towel in front of your feet. If told by your health care provider, add a __________ weight to the end of the towel. Keeping your heel on the floor, put your left / right foot on the towel. Pull the towel toward you by grabbing the towel with your toes and curling them under.  Keep your heel on the floor while you do this. Let your toes relax. Grab the towel with your toes again. Keep going until the towel is completely underneath your foot. Repeat __________ times. Complete this exercise __________ times a day. Balance exercise This exercise improves or maintains your balance. Balance is important in preventing falls. Single leg stand Without wearing shoes, stand near a railing or in a doorway. You may hold on to the railing or door frame as needed for balance. Stand on your left / right foot. Keep your big toe down on the floor and try to keep your arch lifted. If balancing in this position is too easy, try the exercise with your eyes closed or while standing on a pillow. Hold this position for __________ seconds. Repeat __________ times. Complete this exercise __________ times a day. This information is not intended to replace advice given to you by your health care provider. Make sure you discuss any questions you have with your health care provider. Document Revised: 03/29/2019 Document Reviewed: 02/02/2019 Elsevier Patient Education  [place].

## 2021-10-21 DIAGNOSIS — J4520 Mild intermittent asthma, uncomplicated: Secondary | ICD-10-CM | POA: Insufficient documentation

## 2021-10-21 DIAGNOSIS — F1911 Other psychoactive substance abuse, in remission: Secondary | ICD-10-CM | POA: Insufficient documentation

## 2021-10-22 DIAGNOSIS — D252 Subserosal leiomyoma of uterus: Secondary | ICD-10-CM | POA: Diagnosis not present

## 2021-10-22 DIAGNOSIS — M722 Plantar fascial fibromatosis: Secondary | ICD-10-CM | POA: Diagnosis not present

## 2021-10-22 DIAGNOSIS — D250 Submucous leiomyoma of uterus: Secondary | ICD-10-CM | POA: Diagnosis not present

## 2021-10-22 DIAGNOSIS — D251 Intramural leiomyoma of uterus: Secondary | ICD-10-CM | POA: Diagnosis not present

## 2021-10-22 DIAGNOSIS — D259 Leiomyoma of uterus, unspecified: Secondary | ICD-10-CM | POA: Diagnosis not present

## 2021-10-22 DIAGNOSIS — R29898 Other symptoms and signs involving the musculoskeletal system: Secondary | ICD-10-CM | POA: Diagnosis not present

## 2021-10-24 ENCOUNTER — Other Ambulatory Visit: Payer: Self-pay | Admitting: Family Medicine

## 2021-10-24 DIAGNOSIS — D508 Other iron deficiency anemias: Secondary | ICD-10-CM

## 2021-11-11 DIAGNOSIS — M722 Plantar fascial fibromatosis: Secondary | ICD-10-CM | POA: Diagnosis not present

## 2021-11-13 ENCOUNTER — Encounter: Payer: Self-pay | Admitting: *Deleted

## 2021-12-27 DIAGNOSIS — Z3202 Encounter for pregnancy test, result negative: Secondary | ICD-10-CM | POA: Diagnosis not present

## 2021-12-27 DIAGNOSIS — N939 Abnormal uterine and vaginal bleeding, unspecified: Secondary | ICD-10-CM | POA: Diagnosis not present

## 2021-12-27 DIAGNOSIS — D259 Leiomyoma of uterus, unspecified: Secondary | ICD-10-CM | POA: Diagnosis not present

## 2021-12-31 LAB — HM PAP SMEAR: Chlamydia, Swab/Urine, PCR: NEGATIVE

## 2022-01-01 ENCOUNTER — Encounter: Payer: Self-pay | Admitting: Family Medicine

## 2022-01-06 LAB — RESULTS CONSOLE HPV: CHL HPV: NEGATIVE

## 2022-01-08 ENCOUNTER — Telehealth: Payer: Self-pay | Admitting: Hematology and Oncology

## 2022-01-08 NOTE — Telephone Encounter (Signed)
Scheduled appt per 1/24 referral. Pt is aware of appt date and time. Pt is aware to arrive 15 mins prior to appt time.

## 2022-01-13 DIAGNOSIS — D259 Leiomyoma of uterus, unspecified: Secondary | ICD-10-CM | POA: Diagnosis not present
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
## 2022-01-19 ENCOUNTER — Other Ambulatory Visit: Payer: Self-pay | Admitting: Cardiology

## 2022-01-20 ENCOUNTER — Other Ambulatory Visit: Payer: Self-pay

## 2022-01-20 ENCOUNTER — Encounter: Payer: Self-pay | Admitting: Hematology and Oncology

## 2022-01-20 ENCOUNTER — Inpatient Hospital Stay: Payer: BC Managed Care – PPO | Attending: Hematology and Oncology

## 2022-01-20 ENCOUNTER — Inpatient Hospital Stay (HOSPITAL_BASED_OUTPATIENT_CLINIC_OR_DEPARTMENT_OTHER): Payer: BC Managed Care – PPO | Admitting: Hematology and Oncology

## 2022-01-20 VITALS — BP 169/73 | HR 94 | Temp 99.8°F | Resp 18 | Ht 70.0 in | Wt 215.6 lb

## 2022-01-20 DIAGNOSIS — R110 Nausea: Secondary | ICD-10-CM | POA: Insufficient documentation

## 2022-01-20 DIAGNOSIS — D250 Submucous leiomyoma of uterus: Secondary | ICD-10-CM | POA: Insufficient documentation

## 2022-01-20 DIAGNOSIS — D696 Thrombocytopenia, unspecified: Secondary | ICD-10-CM

## 2022-01-20 DIAGNOSIS — D509 Iron deficiency anemia, unspecified: Secondary | ICD-10-CM | POA: Diagnosis not present

## 2022-01-20 DIAGNOSIS — Z79899 Other long term (current) drug therapy: Secondary | ICD-10-CM

## 2022-01-20 LAB — CBC WITH DIFFERENTIAL/PLATELET
Abs Immature Granulocytes: 0.01 10*3/uL (ref 0.00–0.07)
Basophils Absolute: 0 10*3/uL (ref 0.0–0.1)
Basophils Relative: 1 %
Eosinophils Absolute: 0 10*3/uL (ref 0.0–0.5)
Eosinophils Relative: 1 %
HCT: 32.2 % — ABNORMAL LOW (ref 36.0–46.0)
Hemoglobin: 9.6 g/dL — ABNORMAL LOW (ref 12.0–15.0)
Immature Granulocytes: 0 %
Lymphocytes Relative: 32 %
Lymphs Abs: 1.6 10*3/uL (ref 0.7–4.0)
MCH: 26.2 pg (ref 26.0–34.0)
MCHC: 29.8 g/dL — ABNORMAL LOW (ref 30.0–36.0)
MCV: 87.7 fL (ref 80.0–100.0)
Monocytes Absolute: 0.5 10*3/uL (ref 0.1–1.0)
Monocytes Relative: 10 %
Neutro Abs: 2.8 10*3/uL (ref 1.7–7.7)
Neutrophils Relative %: 56 %
Platelets: 61 10*3/uL — ABNORMAL LOW (ref 150–400)
RBC: 3.67 MIL/uL — ABNORMAL LOW (ref 3.87–5.11)
RDW: 20.4 % — ABNORMAL HIGH (ref 11.5–15.5)
WBC: 5.1 10*3/uL (ref 4.0–10.5)
nRBC: 0 % (ref 0.0–0.2)

## 2022-01-20 LAB — ABO/RH: ABO/RH(D): B POS

## 2022-01-20 LAB — IRON AND IRON BINDING CAPACITY (CC-WL,HP ONLY)
Iron: 31 ug/dL (ref 28–170)
Saturation Ratios: 7 % — ABNORMAL LOW (ref 10.4–31.8)
TIBC: 448 ug/dL (ref 250–450)
UIBC: 417 ug/dL (ref 148–442)

## 2022-01-20 LAB — SAMPLE TO BLOOD BANK

## 2022-01-20 LAB — VITAMIN B12: Vitamin B-12: 218 pg/mL (ref 180–914)

## 2022-01-20 LAB — SEDIMENTATION RATE: Sed Rate: 24 mm/hr — ABNORMAL HIGH (ref 0–22)

## 2022-01-20 LAB — SAVE SMEAR(SSMR), FOR PROVIDER SLIDE REVIEW

## 2022-01-20 NOTE — Assessment & Plan Note (Signed)
She has severe thrombocytopenia, likely due to consumption Possible ITP cannot be excluded I recommend close observation for now and avoid NSAID I will order additional work-up to exclude treatable causes such as nutritional deficiency She does not need platelet transfusion right now

## 2022-01-20 NOTE — Assessment & Plan Note (Signed)
This is multifactorial, contributed by medications such as potassium supplement and iron supplement We discussed the role of antiemetics and to avoid taking iron supplement down the road as I will be providing IV iron I also recommend potassium rich diet to see if we can avoid potassium replacement therapy

## 2022-01-20 NOTE — Assessment & Plan Note (Signed)
She is known to have uterine fibroids I recommend a trial of Lupron injection to stop her menstrual cycle and to shrink the fibroid The risk and benefits are discussed and she is in agreement

## 2022-01-20 NOTE — Assessment & Plan Note (Signed)
The cause of her severe iron deficiency anemia is due to severe menorrhagia, in the setting of uterine fibroid bleeding, thrombocytopenia and the use of recent NSAID She is profoundly symptomatic The most likely cause of her anemia is due to chronic blood loss/malabsorption syndrome. We discussed some of the risks, benefits, and alternatives of intravenous iron infusions. The patient is symptomatic from anemia and the iron level is critically low. She tolerated oral iron supplement poorly and desires to achieved higher levels of iron faster for adequate hematopoesis. Some of the side-effects to be expected including risks of infusion reactions, phlebitis, headaches, nausea and fatigue.  The patient is willing to proceed. Patient education material was dispensed.  Goal is to keep ferritin level greater than 50 and resolution of anemia I recommend 3 doses of IV iron sucrose and recheck next month

## 2022-01-21 ENCOUNTER — Encounter: Payer: Self-pay | Admitting: Hematology and Oncology

## 2022-01-21 LAB — TSH: TSH: 1.221 u[IU]/mL (ref 0.308–3.960)

## 2022-01-21 LAB — FERRITIN: Ferritin: 49 ng/mL (ref 11–307)

## 2022-01-25 ENCOUNTER — Other Ambulatory Visit: Payer: Self-pay

## 2022-01-25 ENCOUNTER — Inpatient Hospital Stay: Payer: BC Managed Care – PPO

## 2022-01-25 VITALS — BP 133/88 | HR 70 | Temp 98.2°F | Resp 16

## 2022-01-25 DIAGNOSIS — D250 Submucous leiomyoma of uterus: Secondary | ICD-10-CM | POA: Diagnosis not present

## 2022-01-25 DIAGNOSIS — R110 Nausea: Secondary | ICD-10-CM | POA: Diagnosis not present

## 2022-01-25 DIAGNOSIS — D696 Thrombocytopenia, unspecified: Secondary | ICD-10-CM | POA: Diagnosis not present

## 2022-01-25 DIAGNOSIS — D509 Iron deficiency anemia, unspecified: Secondary | ICD-10-CM | POA: Diagnosis not present

## 2022-01-25 DIAGNOSIS — Z79899 Other long term (current) drug therapy: Secondary | ICD-10-CM | POA: Diagnosis not present

## 2022-01-25 MED ORDER — LEUPROLIDE ACETATE (3 MONTH) 11.25 MG IM KIT
11.2500 mg | PACK | Freq: Once | INTRAMUSCULAR | Status: AC
  Administered 2022-01-25: 11.25 mg via INTRAMUSCULAR
  Filled 2022-01-25: qty 11.25

## 2022-01-25 MED ORDER — SODIUM CHLORIDE 0.9 % IV SOLN
300.0000 mg | Freq: Once | INTRAVENOUS | Status: AC
  Administered 2022-01-25: 300 mg via INTRAVENOUS
  Filled 2022-01-25: qty 300

## 2022-01-25 MED ORDER — SODIUM CHLORIDE 0.9 % IV SOLN: Freq: Once | INTRAVENOUS | Status: AC

## 2022-01-31 MED FILL — Iron Sucrose Inj 20 MG/ML (Fe Equiv): INTRAVENOUS | Qty: 15 | Status: AC

## 2022-02-01 ENCOUNTER — Other Ambulatory Visit: Payer: Self-pay

## 2022-02-01 ENCOUNTER — Inpatient Hospital Stay: Payer: BC Managed Care – PPO

## 2022-02-01 VITALS — BP 141/70 | HR 62 | Temp 98.5°F | Resp 18

## 2022-02-01 DIAGNOSIS — Z79899 Other long term (current) drug therapy: Secondary | ICD-10-CM | POA: Diagnosis not present

## 2022-02-01 DIAGNOSIS — D696 Thrombocytopenia, unspecified: Secondary | ICD-10-CM | POA: Diagnosis not present

## 2022-02-01 DIAGNOSIS — D509 Iron deficiency anemia, unspecified: Secondary | ICD-10-CM | POA: Diagnosis not present

## 2022-02-01 DIAGNOSIS — R110 Nausea: Secondary | ICD-10-CM | POA: Diagnosis not present

## 2022-02-01 DIAGNOSIS — D250 Submucous leiomyoma of uterus: Secondary | ICD-10-CM | POA: Diagnosis not present

## 2022-02-01 MED ORDER — SODIUM CHLORIDE 0.9 % IV SOLN: Freq: Once | INTRAVENOUS | Status: AC

## 2022-02-01 MED ORDER — SODIUM CHLORIDE 0.9 % IV SOLN
300.0000 mg | Freq: Once | INTRAVENOUS | Status: AC
  Administered 2022-02-01: 300 mg via INTRAVENOUS
  Filled 2022-02-01: qty 300

## 2022-02-01 NOTE — Progress Notes (Signed)
Patient refused 30 min post observation after iron infusion. Discharged in stable condition

## 2022-02-01 NOTE — Patient Instructions (Signed)

## 2022-02-08 ENCOUNTER — Ambulatory Visit: Payer: BC Managed Care – PPO

## 2022-02-08 ENCOUNTER — Inpatient Hospital Stay: Payer: BC Managed Care – PPO

## 2022-02-08 ENCOUNTER — Other Ambulatory Visit: Payer: Self-pay

## 2022-02-08 VITALS — BP 143/80 | HR 72 | Temp 98.2°F | Resp 18

## 2022-02-08 DIAGNOSIS — R110 Nausea: Secondary | ICD-10-CM | POA: Diagnosis not present

## 2022-02-08 DIAGNOSIS — Z79899 Other long term (current) drug therapy: Secondary | ICD-10-CM | POA: Diagnosis not present

## 2022-02-08 DIAGNOSIS — D509 Iron deficiency anemia, unspecified: Secondary | ICD-10-CM | POA: Diagnosis not present

## 2022-02-08 DIAGNOSIS — D696 Thrombocytopenia, unspecified: Secondary | ICD-10-CM | POA: Diagnosis not present

## 2022-02-08 DIAGNOSIS — D250 Submucous leiomyoma of uterus: Secondary | ICD-10-CM | POA: Diagnosis not present

## 2022-02-08 MED ORDER — SODIUM CHLORIDE 0.9 % IV SOLN: Freq: Once | INTRAVENOUS | Status: AC

## 2022-02-08 MED ORDER — SODIUM CHLORIDE 0.9 % IV SOLN
300.0000 mg | Freq: Once | INTRAVENOUS | Status: AC
  Administered 2022-02-08: 300 mg via INTRAVENOUS
  Filled 2022-02-08: qty 300

## 2022-02-11 ENCOUNTER — Ambulatory Visit: Payer: BC Managed Care – PPO

## 2022-02-17 ENCOUNTER — Inpatient Hospital Stay: Payer: BC Managed Care – PPO | Admitting: Hematology and Oncology

## 2022-02-17 ENCOUNTER — Inpatient Hospital Stay: Payer: BC Managed Care – PPO
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
## 2022-02-20 ENCOUNTER — Encounter: Payer: Self-pay | Admitting: Obstetrics and Gynecology

## 2022-02-20 ENCOUNTER — Other Ambulatory Visit: Payer: Self-pay

## 2022-02-20 ENCOUNTER — Ambulatory Visit: Payer: BC Managed Care – PPO | Admitting: Obstetrics and Gynecology

## 2022-02-20 VITALS — BP 152/104 | HR 88 | Wt 214.0 lb

## 2022-02-20 DIAGNOSIS — N920 Excessive and frequent menstruation with regular cycle: Secondary | ICD-10-CM | POA: Diagnosis not present

## 2022-02-20 DIAGNOSIS — Z1231 Encounter for screening mammogram for malignant neoplasm of breast: Secondary | ICD-10-CM

## 2022-02-20 DIAGNOSIS — D250 Submucous leiomyoma of uterus: Secondary | ICD-10-CM

## 2022-02-20 MED ORDER — TRANEXAMIC ACID 650 MG PO TABS: 1300.0000 mg | ORAL_TABLET | Freq: Three times a day (TID) | ORAL | 2 refills | Status: DC

## 2022-02-24 ENCOUNTER — Inpatient Hospital Stay: Payer: BC Managed Care – PPO | Attending: Hematology and Oncology

## 2022-02-24 DIAGNOSIS — E876 Hypokalemia: Secondary | ICD-10-CM | POA: Insufficient documentation

## 2022-02-24 DIAGNOSIS — D693 Immune thrombocytopenic purpura: Secondary | ICD-10-CM | POA: Insufficient documentation

## 2022-02-24 DIAGNOSIS — R110 Nausea: Secondary | ICD-10-CM | POA: Diagnosis not present

## 2022-02-24 DIAGNOSIS — D250 Submucous leiomyoma of uterus: Secondary | ICD-10-CM | POA: Insufficient documentation

## 2022-02-24 DIAGNOSIS — Z7952 Long term (current) use of systemic steroids: Secondary | ICD-10-CM | POA: Insufficient documentation

## 2022-02-24 DIAGNOSIS — D696 Thrombocytopenia, unspecified: Secondary | ICD-10-CM

## 2022-02-24 DIAGNOSIS — D509 Iron deficiency anemia, unspecified: Secondary | ICD-10-CM

## 2022-02-24 DIAGNOSIS — Z79899 Other long term (current) drug therapy: Secondary | ICD-10-CM | POA: Insufficient documentation

## 2022-02-24 DIAGNOSIS — Z7982 Long term (current) use of aspirin: Secondary | ICD-10-CM | POA: Diagnosis not present

## 2022-02-24 DIAGNOSIS — N920 Excessive and frequent menstruation with regular cycle: Secondary | ICD-10-CM | POA: Diagnosis not present

## 2022-02-24 DIAGNOSIS — I509 Heart failure, unspecified: Secondary | ICD-10-CM | POA: Insufficient documentation

## 2022-02-24 DIAGNOSIS — D500 Iron deficiency anemia secondary to blood loss (chronic): Secondary | ICD-10-CM | POA: Insufficient documentation

## 2022-02-24 LAB — CBC WITH DIFFERENTIAL/PLATELET
Abs Immature Granulocytes: 0.01 10*3/uL (ref 0.00–0.07)
Basophils Absolute: 0 10*3/uL (ref 0.0–0.1)
Basophils Relative: 1 %
Eosinophils Absolute: 0.1 10*3/uL (ref 0.0–0.5)
Eosinophils Relative: 1 %
HCT: 34.7 % — ABNORMAL LOW (ref 36.0–46.0)
Hemoglobin: 10.5 g/dL — ABNORMAL LOW (ref 12.0–15.0)
Immature Granulocytes: 0 %
Lymphocytes Relative: 32 %
Lymphs Abs: 1.5 10*3/uL (ref 0.7–4.0)
MCH: 26.8 pg (ref 26.0–34.0)
MCHC: 30.3 g/dL (ref 30.0–36.0)
MCV: 88.5 fL (ref 80.0–100.0)
Monocytes Absolute: 0.4 10*3/uL (ref 0.1–1.0)
Monocytes Relative: 8 %
Neutro Abs: 2.7 10*3/uL (ref 1.7–7.7)
Neutrophils Relative %: 58 %
Platelets: 55 10*3/uL — ABNORMAL LOW (ref 150–400)
RBC: 3.92 MIL/uL (ref 3.87–5.11)
RDW: 16.6 % — ABNORMAL HIGH (ref 11.5–15.5)
WBC: 4.7 10*3/uL (ref 4.0–10.5)
nRBC: 0 % (ref 0.0–0.2)

## 2022-02-24 LAB — SAMPLE TO BLOOD BANK

## 2022-02-25 ENCOUNTER — Other Ambulatory Visit: Payer: Self-pay | Admitting: Hematology and Oncology

## 2022-02-25 DIAGNOSIS — D509 Iron deficiency anemia, unspecified: Secondary | ICD-10-CM

## 2022-02-27 ENCOUNTER — Other Ambulatory Visit: Payer: Self-pay

## 2022-02-27 ENCOUNTER — Other Ambulatory Visit: Payer: Self-pay | Admitting: Hematology and Oncology

## 2022-02-27 ENCOUNTER — Encounter: Payer: Self-pay | Admitting: Hematology and Oncology

## 2022-02-27 ENCOUNTER — Inpatient Hospital Stay: Payer: BC Managed Care – PPO | Admitting: Hematology and Oncology

## 2022-02-27 ENCOUNTER — Other Ambulatory Visit: Payer: BC Managed Care – PPO

## 2022-02-27 DIAGNOSIS — D696 Thrombocytopenia, unspecified: Secondary | ICD-10-CM

## 2022-02-27 DIAGNOSIS — D509 Iron deficiency anemia, unspecified: Secondary | ICD-10-CM | POA: Diagnosis not present

## 2022-02-27 DIAGNOSIS — D250 Submucous leiomyoma of uterus: Secondary | ICD-10-CM

## 2022-02-27 DIAGNOSIS — D500 Iron deficiency anemia secondary to blood loss (chronic): Secondary | ICD-10-CM | POA: Diagnosis not present

## 2022-02-27 DIAGNOSIS — Z7952 Long term (current) use of systemic steroids: Secondary | ICD-10-CM | POA: Diagnosis not present

## 2022-02-27 DIAGNOSIS — R110 Nausea: Secondary | ICD-10-CM | POA: Diagnosis not present

## 2022-02-27 DIAGNOSIS — D693 Immune thrombocytopenic purpura: Secondary | ICD-10-CM | POA: Diagnosis not present

## 2022-02-27 DIAGNOSIS — Z7982 Long term (current) use of aspirin: Secondary | ICD-10-CM | POA: Diagnosis not present

## 2022-02-27 DIAGNOSIS — N920 Excessive and frequent menstruation with regular cycle: Secondary | ICD-10-CM | POA: Diagnosis not present

## 2022-02-27 DIAGNOSIS — Z79899 Other long term (current) drug therapy: Secondary | ICD-10-CM | POA: Diagnosis not present

## 2022-02-27 DIAGNOSIS — E876 Hypokalemia: Secondary | ICD-10-CM | POA: Diagnosis not present

## 2022-02-27 DIAGNOSIS — I509 Heart failure, unspecified: Secondary | ICD-10-CM | POA: Diagnosis not present

## 2022-02-27 MED ORDER — PREDNISONE 20 MG PO TABS: 60.0000 mg | ORAL_TABLET | Freq: Every day | ORAL | 0 refills | Status: DC

## 2022-02-27 NOTE — Progress Notes (Signed)
Received call from Charlane Ferretti, Dr. Kendall Flack office at [phone] requesting office note faxed to [phone]. Faxed note and received confirmation. ?

## 2022-02-27 NOTE — Assessment & Plan Note (Signed)
I suspect she might have ITP ?Consumption from menorrhagia causing low platelet count is also possible ?I recommend a trial of prednisone ?We discussed risk, benefits and side effects of prednisone and she is in agreement ?I plan to repeat blood work again in about 10 days for further review ?

## 2022-02-27 NOTE — Progress Notes (Signed)
? ?Leslie [name] ?OFFICE PROGRESS NOTE ? ?Leslie Maw, MD ? ?ASSESSMENT & PLAN:  ?Iron deficiency anemia ?She continues to have significant frequent menorrhagia causing persistent anemia ?The most likely cause of her anemia is due to chronic blood loss/malabsorption syndrome. ?We discussed some of the risks, benefits, and alternatives of intravenous iron infusions. The patient is symptomatic from anemia and the iron level is critically low. She tolerated oral iron supplement poorly and desires to achieved higher levels of iron faster for adequate hematopoesis. Some of the side-effects to be expected including risks of infusion reactions, phlebitis, headaches, nausea and fatigue.  The patient is willing to proceed. Patient education material was dispensed.  ?Goal is to keep ferritin level greater than 50 and resolution of anemia ?I plan to order 2 more doses of IV iron ? ? 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 ?Fibroids, submucosal ?She is scheduled for procedure on April 7 ?I discussed importance of getting her on treatment to get her hemoglobin and platelet count is near normal as possible prior to surgery and she is in agreement to proceed with the plan of care ? ?No orders of the defined types were placed in this encounter. ? ? ?The total time spent in the appointment was 40 minutes encounter with patients including review of chart and various tests results, discussions about plan of care and coordination of care plan ? ? All questions were answered. The patient knows to call the clinic with any problems, questions or concerns. No barriers to learning was detected. ? ? ? Leslie Lark, MD ?3/16/202312:54  PM ? ?INTERVAL HISTORY: ?Leslie [name] 46 y.o. 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 ?SUMMARY OF HEMATOLOGIC HISTORY: ? ?She was found to have abnormal CBC from recent blood draw ? ?Her recent CBC from 06/10/2021 showed low platelet count of 105 ?Most recently, on January 06, 2022, her platelet count was 63,000 ?She denies recent bruising/bleeding, such as spontaneous epistaxis, hematuria, melena or hematochezia ?She has excessive menorrhagia since July of 2022 which she attributed to excessive menstruation after COVID-19 vaccination on July 31 ?However, on review of her blood work dated back to June 2022, she was noted to have mild thrombocytopenia ?At the end of May, the patient had severe bronchitis/respiratory infection which I think precipitated possible acute ITP event ?She was even recently evaluated by gynecologist and was told she had 3 uterine fibroids ?Hysterectomy was discussed ?The patient denies history of liver disease, exposure to heparin, history of cardiac murmur/prior cardiovascular surgery or recent new medications ?She denies prior blood or platelet transfusions ?The patient started taking more iron rich diet such as spinach and liver ?She has been taking oral iron supplement daily at night for several years ?She has severe chronic nausea ?Of note, she is taking potassium replacement therapy due to hypokalemia related to her diuretic therapy ?Several years ago, she was diagnosed with congestive heart failure and she takes aspirin on a regular basis ?She also  take NSAIDs regularly ?She was started on Megace for several months without success of stopping her menorrhagia and recently was placed on the birth control ?She has chronic  intermittent abdominal pain ?She menarche at the age of 49 and has no children ?She usually has regular menstruation until after July, she has been having frequent bleeding that really stops.  She has passage of large amount of clots ?She had prior colonoscopy and that was norma ? ?I have reviewed the past medical history, past surgical history, social history and family history with the patient and they are unchanged from previous note. ? ?ALLERGIES:  is allergic to amlodipine, bystolic [nebivolol hcl], and lisinopril. ? ?MEDICATIONS:  ?Current Outpatient Medications  ?Medication Sig Dispense Refill  ? albuterol (VENTOLIN HFA) 108 (90 Base) MCG/ACT inhaler INHALE 1-2 PUFFS BY MOUTH EVERY 6 HOURS AS NEEDED FOR WHEEZE OR SHORTNESS OF BREATH 1 each 0  ? amLODipine (NORVASC) 10 MG tablet Take 0.5 tablets (5 mg total) by mouth daily. 60 tablet 1  ? Ascorbic Acid (VITAMIN C) 100 MG tablet Take 100 mg by mouth daily.    ? aspirin EC 81 MG tablet Take 81 mg by mouth daily. Swallow whole.    ? cetirizine (ZYRTEC) 10 MG tablet TAKE 1 TABLET BY MOUTH EVERY DAY (Patient not taking: Reported on 02/20/2022) 30 tablet 0  ? ferrous sulfate 325 (65 FE) MG tablet TAKE 1 TABLET BY MOUTH 3 TIMES DAILY WITH MEALS. 270 tablet 1  ? labetalol (NORMODYNE) 200 MG tablet TAKE 1/2 TAB (100 MG) BY MOUTH EVERY MORNING AND 1 TAB EVERY EVENING. PT  NEEDS  TO  SCHEDULE  AN  OFFICE  VISIT  FOR  FURTHER  REFILLS 135 tablet 0  ? meloxicam (MOBIC) 15 MG tablet Take 1 tablet (15 mg total) by mouth daily as needed for pain. With food. (Patient not taking: Reported on 02/20/2022) 30 tablet 0  ? Multiple Vitamins-Minerals (AIRBORNE PO) Take by mouth.    ? mupirocin ointment (BACTROBAN) 2 % Apply 1 application topically 2 (two) times daily. 22 g 0  ? norethindrone (AYGESTIN) 5 MG tablet Take 2 tablets (10 mg total) by mouth daily. 20 tablet 0  ? ondansetron (ZOFRAN-ODT) 8 MG disintegrating tablet Take 8 mg by mouth 3 (three) times daily as needed. (Patient not  taking: Reported on 09/10/2021)    ? PARoxetine (PAXIL-CR) 25 MG 24 hr tablet TAKE 1 TABLET BY MOUTH EVERY DAY (Patient not taking: Reported on 09/10/2021) 90 tablet 1  ? potassium chloride SA (KLOR-CON) 20 MEQ tablet Take 1 tablet (20 mEq total) by mouth 2 (two) times daily. Pt states tablet is too big and she cuts tablet in in 1/4 180 tablet 3  ? predniSONE (DELTASONE) 20 MG tablet Take 3 tablets (60 mg total) by mouth daily with breakfast. 90 tablet 0  ? tranexamic acid (LYSTEDA) 650 MG TABS tablet Take 2 tablets (1,300 mg total) by mouth 3 (three) times daily. Take during menses for a maximum of five days 30 tablet 2  ? valsartan-hydrochlorothiazide (DIOVAN-HCT) 320-25 MG tablet Take 0.5 tablets by mouth daily. Refills with PCP 45 tablet 2  ? ?Current Facility-Administered Medications  ?Medication Dose Route Frequency Provider Last Rate Last Admin  ? 0.9 %  sodium chloride infusion  500 mL Intravenous Continuous Nandigam, Venia Minks, MD      ?  ? ?REVIEW OF SYSTEMS:   ?Constitutional: Denies fevers, chills or night sweats ?Eyes: Denies blurriness of vision ?Ears, nose, mouth, throat, and  face: Denies mucositis or sore throat ?Respiratory: Denies cough, dyspnea or wheezes ?Cardiovascular: Denies palpitation, chest discomfort or lower extremity swelling ?Gastrointestinal:  Denies nausea, heartburn or change in bowel habits ?Skin: Denies abnormal skin rashes ?Lymphatics: Denies new lymphadenopathy or easy bruising ?Neurological:Denies numbness, tingling or new weaknesses ?Behavioral/Psych: Mood is stable, no new changes  ?All other systems were reviewed with the patient and are negative. ? ?PHYSICAL EXAMINATION: ?ECOG PERFORMANCE STATUS: 2 - Symptomatic, <50% confined to bed ? ?Vitals:  ? 02/27/22 0846  ?BP: (!) 149/91  ?Pulse: 92  ?Resp: 18  ?Temp: 98.1 ?F (36.7 ?C)  ?SpO2: 100%  ? ?Filed Weights  ? 02/27/22 0846  ?Weight: 209 lb 9.6 oz (95.1 kg)  ? ? ?GENERAL:alert, no distress and comfortable ?NEURO: alert &  oriented x 3 with fluent speech, no focal motor/sensory deficits ? ?LABORATORY DATA:  ?I have reviewed the data as listed ? ?   ?Component Value Date/Time  ? NA 140 06/10/2021 1541  ? NA 139 06/27/2020 1618  ? K 3.4 (L) 06/

## 2022-02-27 NOTE — Assessment & Plan Note (Signed)
She is scheduled for procedure on April 7 ?I discussed importance of getting her on treatment to get her hemoglobin and platelet count is near normal as possible prior to surgery and she is in agreement to proceed with the plan of care ?

## 2022-02-27 NOTE — Assessment & Plan Note (Signed)
She continues to have significant frequent menorrhagia causing persistent anemia ?The most likely cause of her anemia is due to chronic blood loss/malabsorption syndrome. ?We discussed some of the risks, benefits, and alternatives of intravenous iron infusions. The patient is symptomatic from anemia and the iron level is critically low. She tolerated oral iron supplement poorly and desires to achieved higher levels of iron faster for adequate hematopoesis. Some of the side-effects to be expected including risks of infusion reactions, phlebitis, headaches, nausea and fatigue.  The patient is willing to proceed. Patient education material was dispensed.  ?Goal is to keep ferritin level greater than 50 and resolution of anemia ?I plan to order 2 more doses of IV iron ? ?

## 2022-03-05 ENCOUNTER — Inpatient Hospital Stay: Payer: BC Managed Care – PPO

## 2022-03-05 ENCOUNTER — Other Ambulatory Visit: Payer: Self-pay

## 2022-03-05 ENCOUNTER — Encounter: Payer: Self-pay | Admitting: Family Medicine

## 2022-03-05 VITALS — BP 152/88 | HR 91 | Temp 98.2°F | Resp 18

## 2022-03-05 DIAGNOSIS — D509 Iron deficiency anemia, unspecified: Secondary | ICD-10-CM

## 2022-03-05 DIAGNOSIS — R110 Nausea: Secondary | ICD-10-CM | POA: Diagnosis not present

## 2022-03-05 DIAGNOSIS — E876 Hypokalemia: Secondary | ICD-10-CM | POA: Diagnosis not present

## 2022-03-05 DIAGNOSIS — D500 Iron deficiency anemia secondary to blood loss (chronic): Secondary | ICD-10-CM | POA: Diagnosis not present

## 2022-03-05 DIAGNOSIS — I509 Heart failure, unspecified: Secondary | ICD-10-CM | POA: Diagnosis not present

## 2022-03-05 DIAGNOSIS — Z7952 Long term (current) use of systemic steroids: Secondary | ICD-10-CM | POA: Diagnosis not present

## 2022-03-05 DIAGNOSIS — Z79899 Other long term (current) drug therapy: Secondary | ICD-10-CM | POA: Diagnosis not present

## 2022-03-05 DIAGNOSIS — D693 Immune thrombocytopenic purpura: Secondary | ICD-10-CM | POA: Diagnosis not present

## 2022-03-05 DIAGNOSIS — F419 Anxiety disorder, unspecified: Secondary | ICD-10-CM

## 2022-03-05 DIAGNOSIS — N920 Excessive and frequent menstruation with regular cycle: Secondary | ICD-10-CM | POA: Diagnosis not present

## 2022-03-05 DIAGNOSIS — D250 Submucous leiomyoma of uterus: Secondary | ICD-10-CM | POA: Diagnosis not present

## 2022-03-05 DIAGNOSIS — Z7982 Long term (current) use of aspirin: Secondary | ICD-10-CM | POA: Diagnosis not present

## 2022-03-05 MED ORDER — PAROXETINE HCL ER 25 MG PO TB24: 25.0000 mg | ORAL_TABLET | Freq: Every day | ORAL | 1 refills | Status: DC

## 2022-03-05 MED ORDER — SODIUM CHLORIDE 0.9 % IV SOLN: Freq: Once | INTRAVENOUS | Status: AC

## 2022-03-05 MED ORDER — SODIUM CHLORIDE 0.9 % IV SOLN
300.0000 mg | Freq: Once | INTRAVENOUS | Status: AC
  Administered 2022-03-05: 300 mg via INTRAVENOUS
  Filled 2022-03-05: qty 300

## 2022-03-05 NOTE — Progress Notes (Signed)
Patient did not wait 30 minutes post iron observation. VSS upon discharge  ?

## 2022-03-07 ENCOUNTER — Telehealth: Payer: Self-pay | Admitting: *Deleted

## 2022-03-07 NOTE — Telephone Encounter (Signed)
Error

## 2022-03-07 NOTE — Telephone Encounter (Signed)
Left patient an urgent message to call the surgery scheduler or office back with best surgery date as of this time today, 03/29, 04/12, or 04/26. ?

## 2022-03-10 ENCOUNTER — Inpatient Hospital Stay: Payer: BC Managed Care – PPO

## 2022-03-10 ENCOUNTER — Inpatient Hospital Stay: Payer: BC Managed Care – PPO | Admitting: Hematology and Oncology

## 2022-03-10 ENCOUNTER — Other Ambulatory Visit: Payer: Self-pay

## 2022-03-10 ENCOUNTER — Telehealth: Payer: Self-pay

## 2022-03-10 ENCOUNTER — Encounter (HOSPITAL_BASED_OUTPATIENT_CLINIC_OR_DEPARTMENT_OTHER): Payer: Self-pay | Admitting: Obstetrics and Gynecology

## 2022-03-10 DIAGNOSIS — D693 Immune thrombocytopenic purpura: Secondary | ICD-10-CM | POA: Diagnosis not present

## 2022-03-10 DIAGNOSIS — D500 Iron deficiency anemia secondary to blood loss (chronic): Secondary | ICD-10-CM | POA: Diagnosis not present

## 2022-03-10 DIAGNOSIS — D509 Iron deficiency anemia, unspecified: Secondary | ICD-10-CM

## 2022-03-10 DIAGNOSIS — D696 Thrombocytopenia, unspecified: Secondary | ICD-10-CM

## 2022-03-10 DIAGNOSIS — Z79899 Other long term (current) drug therapy: Secondary | ICD-10-CM | POA: Diagnosis not present

## 2022-03-10 DIAGNOSIS — D250 Submucous leiomyoma of uterus: Secondary | ICD-10-CM

## 2022-03-10 DIAGNOSIS — N920 Excessive and frequent menstruation with regular cycle: Secondary | ICD-10-CM | POA: Diagnosis not present

## 2022-03-10 DIAGNOSIS — Z7952 Long term (current) use of systemic steroids: Secondary | ICD-10-CM | POA: Diagnosis not present

## 2022-03-10 DIAGNOSIS — I509 Heart failure, unspecified: Secondary | ICD-10-CM | POA: Diagnosis not present

## 2022-03-10 DIAGNOSIS — E876 Hypokalemia: Secondary | ICD-10-CM | POA: Diagnosis not present

## 2022-03-10 DIAGNOSIS — Z7982 Long term (current) use of aspirin: Secondary | ICD-10-CM | POA: Diagnosis not present

## 2022-03-10 DIAGNOSIS — R110 Nausea: Secondary | ICD-10-CM | POA: Diagnosis not present

## 2022-03-10 LAB — CBC WITH DIFFERENTIAL/PLATELET
Abs Immature Granulocytes: 0.07 10*3/uL (ref 0.00–0.07)
Basophils Absolute: 0 10*3/uL (ref 0.0–0.1)
Basophils Relative: 0 %
Eosinophils Absolute: 0 10*3/uL (ref 0.0–0.5)
Eosinophils Relative: 0 %
HCT: 31.5 % — ABNORMAL LOW (ref 36.0–46.0)
Hemoglobin: 9.7 g/dL — ABNORMAL LOW (ref 12.0–15.0)
Immature Granulocytes: 1 %
Lymphocytes Relative: 5 %
Lymphs Abs: 0.6 10*3/uL — ABNORMAL LOW (ref 0.7–4.0)
MCH: 27.2 pg (ref 26.0–34.0)
MCHC: 30.8 g/dL (ref 30.0–36.0)
MCV: 88.5 fL (ref 80.0–100.0)
Monocytes Absolute: 0.1 10*3/uL (ref 0.1–1.0)
Monocytes Relative: 1 %
Neutro Abs: 11.5 10*3/uL — ABNORMAL HIGH (ref 1.7–7.7)
Neutrophils Relative %: 93 %
Platelets: 406 10*3/uL — ABNORMAL HIGH (ref 150–400)
RBC: 3.56 MIL/uL — ABNORMAL LOW (ref 3.87–5.11)
RDW: 16.7 % — ABNORMAL HIGH (ref 11.5–15.5)
WBC: 12.3 10*3/uL — ABNORMAL HIGH (ref 4.0–10.5)
nRBC: 0 % (ref 0.0–0.2)

## 2022-03-10 LAB — IRON AND IRON BINDING CAPACITY (CC-WL,HP ONLY)
Iron: 34 ug/dL (ref 28–170)
Saturation Ratios: 8 % — ABNORMAL LOW (ref 10.4–31.8)
TIBC: 430 ug/dL (ref 250–450)
UIBC: 396 ug/dL (ref 148–442)

## 2022-03-10 LAB — SAMPLE TO BLOOD BANK

## 2022-03-10 NOTE — Progress Notes (Signed)
Spoke w/ via phone for pre-op interview: patient ?Lab needs dos: EKG and I-Stat ?Lab results: Echo 05/31/17, CBCD 02/24/22 ?COVID test: patient states asymptomatic no test needed. ?Arrive at 0900 03/12/22 ?NPO after MN except clear liquids.Clear liquids from MN until 0800 ?Med rec completed. ?Medications to take morning of surgery: none- pt states she takes all her medications at night ?Diabetic medication: NA ?Patient instructed no nail polish to be worn day of surgery. ?Patient instructed to bring photo id and insurance card day of surgery.  ?Patient aware to have Driver (ride ) / caregiver for 24 hours after surgery. (Husband to drive) ?Patient Special Instructions: NA ?Pre-Op special Istructions: NA ?Patient verbalized understanding of instructions that were given at this phone interview. ?Patient denies shortness of breath, chest pain, fever, cough at this phone interview.  ?
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
## 2022-03-10 NOTE — Telephone Encounter (Signed)
The patient would like for the nurse to give her a call back. ?

## 2022-03-11 ENCOUNTER — Encounter: Payer: Self-pay | Admitting: Hematology and Oncology

## 2022-03-11 ENCOUNTER — Ambulatory Visit: Payer: BC Managed Care – PPO | Admitting: Nurse Practitioner

## 2022-03-11 ENCOUNTER — Telehealth: Payer: Self-pay

## 2022-03-11 LAB — FERRITIN: Ferritin: 118 ng/mL (ref 11–307)

## 2022-03-11 MED ORDER — PREDNISONE 20 MG PO TABS: ORAL_TABLET | ORAL | 0 refills | Status: DC

## 2022-03-11 NOTE — Telephone Encounter (Signed)
Called patient, no answer, left voicemail with surgery date, time and preop instructions. ?

## 2022-03-11 NOTE — Assessment & Plan Note (Signed)
Even though she is still anemic, her iron deficiency has resolved ?Her ferritin is adequate ?I will proceed to cancel her scheduled IV iron infusion this week I will monitor in her next visit ?

## 2022-03-11 NOTE — Assessment & Plan Note (Signed)
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

## 2022-03-11 NOTE — Progress Notes (Signed)
? ?[facility] ?OFFICE PROGRESS NOTE ? ?[name], MD ? ?ASSESSMENT & PLAN:  ?Iron deficiency anemia ?Even though she is still anemic, her iron deficiency has resolved ?Her ferritin is adequate ?I will proceed to cancel her scheduled IV iron infusion this week I will monitor in her next visit ? 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 ?Fibroids, submucosal ?She is scheduled to undergo procedure end of the week ?I would defer to her gynecologist for management ? ?No orders of the defined types were placed in this encounter. ? ? ?The total time spent in the appointment was 20 minutes encounter with patients including review of chart and various tests results, discussions about plan of care and coordination of care plan ? ? All questions were answered. The patient knows to call the clinic with any problems, questions or concerns. No barriers to learning was detected. ? ? ? [name], MD ?3/28/20239:05 AM ? ?INTERVAL HISTORY: ?Leslie [name] 46 y.o. female returns for follow-up on ITP and iron deficiency anemia ?She tolerated intravenous iron well although her procedure was complicated by poor venous access ?She denies recent bleeding ? ?SUMMARY OF HEMATOLOGIC HISTORY: ? ?She was found to have abnormal CBC from recent blood draw ? ?Her recent CBC from 06/10/2021 showed low platelet count of 105 ?Most recently, on January 06, 2022, her platelet count was 63,000 ?She denies recent bruising/bleeding, such as spontaneous epistaxis, hematuria, melena or hematochezia ?She has excessive menorrhagia since July of 2022 which she attributed to excessive menstruation after COVID-19 vaccination on July 31 ?However, on review of her blood work dated back to June 2022, she was noted to have mild thrombocytopenia ?At the end of May, the patient had severe bronchitis/respiratory  infection which I think precipitated possible acute ITP event ?She was even recently evaluated by gynecologist and was told she had 3 uterine fibroids ?Hysterectomy was discussed ?The patient denies history of liver disease, exposure to heparin, history of cardiac murmur/prior cardiovascular surgery or recent new medications ?She denies prior blood or platelet transfusions ?The patient started taking more iron rich diet such as spinach and liver ?She has been taking oral iron supplement daily at night for several years ?She has severe chronic nausea ?Of note, she is taking potassium replacement therapy due to hypokalemia related to her diuretic therapy ?Several years ago, she was diagnosed with congestive heart failure and she takes aspirin on a regular basis ?She also take NSAIDs regularly ?She was started on Megace for several months without success of stopping her menorrhagia and recently was placed on the birth control ?She has chronic intermittent abdominal pain ?She menarche at the age of 76 and has no children ?She usually has regular menstruation until after July, she has been having frequent bleeding that really stops.  She has passage of large amount of clots ?She had prior colonoscopy and that was normal ?February 27, 2022, she was started on prednisone for ITP ?March 11, 2022, prednisone taper is initiated for ITP ? ? ?I have reviewed the past medical history, past surgical history, social history and family history with the patient and they are unchanged from previous note. ? ?ALLERGIES:  is allergic to amlodipine, bystolic [nebivolol hcl], and lisinopril. ? ?MEDICATIONS:  ?Current Outpatient Medications  ?Medication Sig Dispense Refill  ? albuterol (VENTOLIN HFA) 108 (90 Base) MCG/ACT inhaler INHALE 1-2  PUFFS BY MOUTH EVERY 6 HOURS AS NEEDED FOR WHEEZE OR SHORTNESS OF BREATH 1 each 0  ? amLODipine (NORVASC) 10 MG tablet Take 0.5 tablets (5 mg total) by mouth daily. 60 tablet 1  ? Ascorbic Acid (VITAMIN C)  100 MG tablet Take 100 mg by mouth daily.    ? aspirin EC 81 MG tablet Take 81 mg by mouth daily. Swallow whole.    ? labetalol (NORMODYNE) 200 MG tablet TAKE 1/2 TAB (100 MG) BY MOUTH EVERY MORNING AND 1 TAB EVERY EVENING. PT  NEEDS  TO  SCHEDULE  AN  OFFICE  VISIT  FOR  FURTHER  REFILLS 135 tablet 0  ? Loratadine (CLARITIN PO) Take by mouth daily.    ? Multiple Vitamins-Minerals (AIRBORNE PO) Take by mouth.    ? mupirocin ointment (BACTROBAN) 2 % Apply 1 application topically 2 (two) times daily. 22 g 0  ? norethindrone (AYGESTIN) 5 MG tablet Take 2 tablets (10 mg total) by mouth daily. 20 tablet 0  ? PARoxetine (PAXIL-CR) 25 MG 24 hr tablet Take 1 tablet (25 mg total) by mouth daily. 90 tablet 1  ? potassium chloride SA (KLOR-CON) 20 MEQ tablet Take 1 tablet (20 mEq total) by mouth 2 (two) times daily. Pt states tablet is too big and she cuts tablet in in 1/4 180 tablet 3  ? predniSONE (DELTASONE) 20 MG tablet Prednisone taper as directed 90 tablet 0  ? tranexamic acid (LYSTEDA) 650 MG TABS tablet Take 2 tablets (1,300 mg total) by mouth 3 (three) times daily. Take during menses for a maximum of five days 30 tablet 2  ? valsartan-hydrochlorothiazide (DIOVAN-HCT) 320-25 MG tablet Take 0.5 tablets by mouth daily. Refills with PCP 45 tablet 2  ? ?Current Facility-Administered Medications  ?Medication Dose Route Frequency Provider Last Rate Last Admin  ? 0.9 %  sodium chloride infusion  500 mL Intravenous Continuous Nandigam, Venia Minks, MD      ?  ? ?REVIEW OF SYSTEMS:   ?Constitutional: Denies fevers, chills or night sweats ?Eyes: Denies blurriness of vision ?Ears, nose, mouth, throat, and face: Denies mucositis or sore throat ?Respiratory: Denies cough, dyspnea or wheezes ?Cardiovascular: Denies palpitation, chest discomfort or lower extremity swelling ?Gastrointestinal:  Denies nausea, heartburn or change in bowel habits ?Skin: Denies abnormal skin rashes ?Lymphatics: Denies new lymphadenopathy or easy  bruising ?Neurological:Denies numbness, tingling or new weaknesses ?Behavioral/Psych: Mood is stable, no new changes  ?All other systems were reviewed with the patient and are negative. ? ?PHYSICAL EXAMINATION: ?ECOG PERFORMANCE STATUS: 0 - Asymptomatic ? ?Vitals:  ? 03/10/22 1509  ?BP: (!) 171/80  ?Pulse: (!) 114  ?Resp: 18  ?Temp: 99.6 ?F (37.6 ?C)  ?SpO2: 100%  ? ?Filed Weights  ? 03/10/22 1509  ?Weight: 210 lb 12.8 oz (95.6 kg)  ? ? ?GENERAL:alert, no distress and comfortable ?NEURO: alert & oriented x 3 with fluent speech, no focal motor/sensory deficits ? ?LABORATORY DATA:  ?I have reviewed the data as listed ? ?   ?Component Value Date/Time  ? NA 140 06/10/2021 1541  ? NA 139 06/27/2020 1618  ? K 3.4 (L) 06/10/2021 1541  ? CL 102 06/10/2021 1541  ? CO2 28 06/10/2021 1541  ? GLUCOSE 104 (H) 06/10/2021 1541  ? BUN 18 06/10/2021 1541  ? BUN 13 06/27/2020 1618  ? CREATININE 0.81 06/10/2021 1541  ? CREATININE 0.64 06/13/2016 0928  ? CALCIUM 9.5 06/10/2021 1541  ? PROT 7.4 06/10/2021 1541  ? PROT 7.2 06/27/2020 1618  ?  ALBUMIN 4.3 06/10/2021 1541  ? ALBUMIN 4.4 06/27/2020 1618  ? AST 13 06/10/2021 1541  ? ALT 11 06/10/2021 1541  ? ALKPHOS 70 06/10/2021 1541  ? BILITOT 0.2 06/10/2021 1541  ? BILITOT <0.2 06/27/2020 1618  ? GFRNONAA 105 06/27/2020 1618  ? GFRAA 121 06/27/2020 1618  ? ? ?No results found for: SPEP, UPEP ? ?Lab Results  ?Component Value Date  ? WBC 12.3 (H) 03/10/2022  ? NEUTROABS 11.5 (H) 03/10/2022  ? HGB 9.7 (L) 03/10/2022  ? HCT 31.5 (L) 03/10/2022  ? MCV 88.5 03/10/2022  ? PLT 406 (H) 03/10/2022  ? ? ?  Chemistry   ?   ?Component Value Date/Time  ? NA 140 06/10/2021 1541  ? NA 139 06/27/2020 1618  ? K 3.4 (L) 06/10/2021 1541  ? CL 102 06/10/2021 1541  ? CO2 28 06/10/2021 1541  ? BUN 18 06/10/2021 1541  ? BUN 13 06/27/2020 1618  ? CREATININE 0.81 06/10/2021 1541  ? CREATININE 0.64 06/13/2016 0928  ?    ?Component Value Date/Time  ? CALCIUM 9.5 06/10/2021 1541  ? ALKPHOS 70 06/10/2021 1541  ? AST 13  06/10/2021 1541  ? ALT 11 06/10/2021 1541  ? BILITOT 0.2 06/10/2021 1541  ? BILITOT <0.2 06/27/2020 1618  ?  ? ? ?

## 2022-03-11 NOTE — Telephone Encounter (Signed)
Left message for pt to call.

## 2022-03-11 NOTE — Assessment & Plan Note (Signed)
She is scheduled to undergo procedure end of the week ?I would defer to her gynecologist for management ?

## 2022-03-12 ENCOUNTER — Ambulatory Visit (HOSPITAL_BASED_OUTPATIENT_CLINIC_OR_DEPARTMENT_OTHER): Payer: BC Managed Care – PPO | Admitting: Anesthesiology

## 2022-03-12 ENCOUNTER — Encounter (HOSPITAL_BASED_OUTPATIENT_CLINIC_OR_DEPARTMENT_OTHER): Payer: Self-pay | Admitting: Obstetrics and Gynecology

## 2022-03-12 ENCOUNTER — Ambulatory Visit (HOSPITAL_BASED_OUTPATIENT_CLINIC_OR_DEPARTMENT_OTHER)
Admission: RE | Admit: 2022-03-12 | Discharge: 2022-03-12 | Disposition: A | Discharge status: Home / Self Care | Payer: BC Managed Care – PPO | Source: Ambulatory Visit | Attending: Obstetrics and Gynecology | Admitting: Obstetrics and Gynecology

## 2022-03-12 ENCOUNTER — Other Ambulatory Visit: Payer: Self-pay

## 2022-03-12 ENCOUNTER — Encounter (HOSPITAL_BASED_OUTPATIENT_CLINIC_OR_DEPARTMENT_OTHER)
Admission: RE | Disposition: A | Discharge status: Home / Self Care | Payer: Self-pay | Source: Ambulatory Visit | Attending: Obstetrics and Gynecology

## 2022-03-12 DIAGNOSIS — D251 Intramural leiomyoma of uterus: Secondary | ICD-10-CM

## 2022-03-12 DIAGNOSIS — D573 Sickle-cell trait: Secondary | ICD-10-CM | POA: Insufficient documentation

## 2022-03-12 DIAGNOSIS — I10 Essential (primary) hypertension: Secondary | ICD-10-CM | POA: Diagnosis not present

## 2022-03-12 DIAGNOSIS — D759 Disease of blood and blood-forming organs, unspecified: Secondary | ICD-10-CM | POA: Insufficient documentation

## 2022-03-12 DIAGNOSIS — J45909 Unspecified asthma, uncomplicated: Secondary | ICD-10-CM | POA: Insufficient documentation

## 2022-03-12 DIAGNOSIS — R9389 Abnormal findings on diagnostic imaging of other specified body structures: Secondary | ICD-10-CM

## 2022-03-12 DIAGNOSIS — D250 Submucous leiomyoma of uterus: Secondary | ICD-10-CM

## 2022-03-12 DIAGNOSIS — D500 Iron deficiency anemia secondary to blood loss (chronic): Secondary | ICD-10-CM | POA: Insufficient documentation

## 2022-03-12 DIAGNOSIS — F419 Anxiety disorder, unspecified: Secondary | ICD-10-CM | POA: Diagnosis not present

## 2022-03-12 DIAGNOSIS — D259 Leiomyoma of uterus, unspecified: Secondary | ICD-10-CM | POA: Diagnosis not present

## 2022-03-12 DIAGNOSIS — N920 Excessive and frequent menstruation with regular cycle: Secondary | ICD-10-CM

## 2022-03-12 HISTORY — PX: DILATATION & CURETTAGE/HYSTEROSCOPY WITH MYOSURE: SHX6511

## 2022-03-12 LAB — POCT I-STAT, CHEM 8
BUN: 10 mg/dL (ref 6–20)
Calcium, Ion: 1.16 mmol/L (ref 1.15–1.40)
Chloride: 106 mmol/L (ref 98–111)
Creatinine, Ser: 0.7 mg/dL (ref 0.44–1.00)
Glucose, Bld: 100 mg/dL — ABNORMAL HIGH (ref 70–99)
HCT: 32 % — ABNORMAL LOW (ref 36.0–46.0)
Hemoglobin: 10.9 g/dL — ABNORMAL LOW (ref 12.0–15.0)
Potassium: 3.4 mmol/L — ABNORMAL LOW (ref 3.5–5.1)
Sodium: 144 mmol/L (ref 135–145)
TCO2: 25 mmol/L (ref 22–32)

## 2022-03-12 LAB — POCT PREGNANCY, URINE: Preg Test, Ur: NEGATIVE

## 2022-03-12 SURGERY — RADIOFREQUENCY ABLATION, LEIOMYOMA, UTERUS, TRANSCERVICAL APPROACH, WITH US GUIDANCE
Anesthesia: General | Site: Uterus

## 2022-03-12 MED ORDER — FENTANYL CITRATE (PF) 100 MCG/2ML IJ SOLN
INTRAMUSCULAR | Status: AC
  Filled 2022-03-12: qty 2

## 2022-03-12 MED ORDER — KETOROLAC TROMETHAMINE 15 MG/ML IJ SOLN: 15.0000 mg | INTRAMUSCULAR | Status: DC

## 2022-03-12 MED ORDER — KETOROLAC TROMETHAMINE 30 MG/ML IJ SOLN
INTRAMUSCULAR | Status: DC | PRN
  Administered 2022-03-12: 30 mg via INTRAVENOUS

## 2022-03-12 MED ORDER — SODIUM CHLORIDE 0.9 % IR SOLN
Status: DC | PRN
  Administered 2022-03-12 (×4): 3000 mL

## 2022-03-12 MED ORDER — ONDANSETRON HCL 4 MG/2ML IJ SOLN
INTRAMUSCULAR | Status: DC | PRN
  Administered 2022-03-12: 4 mg via INTRAVENOUS

## 2022-03-12 MED ORDER — KETOROLAC TROMETHAMINE 30 MG/ML IJ SOLN
INTRAMUSCULAR | Status: AC
  Filled 2022-03-12: qty 1

## 2022-03-12 MED ORDER — ONDANSETRON HCL 4 MG/2ML IJ SOLN
INTRAMUSCULAR | Status: AC
  Filled 2022-03-12: qty 2

## 2022-03-12 MED ORDER — LACTATED RINGERS IV SOLN: INTRAVENOUS | Status: DC

## 2022-03-12 MED ORDER — MIDAZOLAM HCL 2 MG/2ML IJ SOLN
INTRAMUSCULAR | Status: DC | PRN
Start: 2022-03-12 — End: 2022-03-12
  Administered 2022-03-12: 2 mg via INTRAVENOUS

## 2022-03-12 MED ORDER — ACETAMINOPHEN 500 MG PO TABS
1000.0000 mg | ORAL_TABLET | ORAL | Status: AC
  Administered 2022-03-12: 1000 mg via ORAL

## 2022-03-12 MED ORDER — PROPOFOL 10 MG/ML IV BOLUS
INTRAVENOUS | Status: DC | PRN
  Administered 2022-03-12: 150 mg via INTRAVENOUS

## 2022-03-12 MED ORDER — POVIDONE-IODINE 10 % EX SWAB: 2.0000 "application " | Freq: Once | CUTANEOUS | Status: DC

## 2022-03-12 MED ORDER — OXYCODONE HCL 5 MG PO TABS
ORAL_TABLET | ORAL | Status: AC
  Filled 2022-03-12: qty 1

## 2022-03-12 MED ORDER — LIDOCAINE HCL (PF) 2 % IJ SOLN
INTRAMUSCULAR | Status: AC
  Filled 2022-03-12: qty 5

## 2022-03-12 MED ORDER — TRANEXAMIC ACID-NACL 1000-0.7 MG/100ML-% IV SOLN
INTRAVENOUS | Status: DC | PRN
  Administered 2022-03-12: 1000 mg via INTRAVENOUS

## 2022-03-12 MED ORDER — DEXMEDETOMIDINE (PRECEDEX) IN NS 20 MCG/5ML (4 MCG/ML) IV SYRINGE
PREFILLED_SYRINGE | INTRAVENOUS | Status: DC | PRN
  Administered 2022-03-12 (×2): 8 ug via INTRAVENOUS

## 2022-03-12 MED ORDER — FENTANYL CITRATE (PF) 100 MCG/2ML IJ SOLN: 25.0000 ug | INTRAMUSCULAR | Status: DC | PRN

## 2022-03-12 MED ORDER — ONDANSETRON HCL 4 MG/2ML IJ SOLN: 4.0000 mg | Freq: Once | INTRAMUSCULAR | Status: DC | PRN

## 2022-03-12 MED ORDER — LIDOCAINE 2% (20 MG/ML) 5 ML SYRINGE
INTRAMUSCULAR | Status: DC | PRN
  Administered 2022-03-12: 60 mg via INTRAVENOUS

## 2022-03-12 MED ORDER — DEXMEDETOMIDINE (PRECEDEX) IN NS 20 MCG/5ML (4 MCG/ML) IV SYRINGE
PREFILLED_SYRINGE | INTRAVENOUS | Status: AC
  Filled 2022-03-12: qty 5

## 2022-03-12 MED ORDER — TRANEXAMIC ACID-NACL 1000-0.7 MG/100ML-% IV SOLN
INTRAVENOUS | Status: AC
  Filled 2022-03-12: qty 100

## 2022-03-12 MED ORDER — MIDAZOLAM HCL 2 MG/2ML IJ SOLN
INTRAMUSCULAR | Status: AC
  Filled 2022-03-12: qty 2

## 2022-03-12 MED ORDER — PROPOFOL 10 MG/ML IV BOLUS
INTRAVENOUS | Status: AC
Start: 2022-03-12 — End: ?
  Filled 2022-03-12: qty 20

## 2022-03-12 MED ORDER — OXYCODONE HCL 5 MG PO TABS
5.0000 mg | ORAL_TABLET | Freq: Once | ORAL | Status: AC | PRN
  Administered 2022-03-12: 5 mg via ORAL

## 2022-03-12 MED ORDER — WHITE PETROLATUM EX OINT
TOPICAL_OINTMENT | CUTANEOUS | Status: AC
  Filled 2022-03-12: qty 5

## 2022-03-12 MED ORDER — DEXAMETHASONE SODIUM PHOSPHATE 10 MG/ML IJ SOLN
INTRAMUSCULAR | Status: AC
  Filled 2022-03-12: qty 1

## 2022-03-12 MED ORDER — PHENYLEPHRINE 40 MCG/ML (10ML) SYRINGE FOR IV PUSH (FOR BLOOD PRESSURE SUPPORT)
PREFILLED_SYRINGE | INTRAVENOUS | Status: AC
  Filled 2022-03-12: qty 10

## 2022-03-12 MED ORDER — ACETAMINOPHEN 500 MG PO TABS
ORAL_TABLET | ORAL | Status: AC
  Filled 2022-03-12: qty 2

## 2022-03-12 MED ORDER — DEXAMETHASONE SODIUM PHOSPHATE 10 MG/ML IJ SOLN
INTRAMUSCULAR | Status: DC | PRN
  Administered 2022-03-12 (×2): 5 mg via INTRAVENOUS

## 2022-03-12 MED ORDER — OXYCODONE HCL 5 MG/5ML PO SOLN: 5.0000 mg | Freq: Once | ORAL | Status: AC | PRN

## 2022-03-12 MED ORDER — IBUPROFEN 800 MG PO TABS: 800.0000 mg | ORAL_TABLET | Freq: Three times a day (TID) | ORAL | 0 refills | Status: DC | PRN

## 2022-03-12 MED ORDER — FENTANYL CITRATE (PF) 100 MCG/2ML IJ SOLN
INTRAMUSCULAR | Status: DC | PRN
  Administered 2022-03-12: 25 ug via INTRAVENOUS
  Administered 2022-03-12 (×2): 50 ug via INTRAVENOUS

## 2022-03-12 SURGICAL SUPPLY — 25 items
CATH ROBINSON RED A/P 16FR (CATHETERS) ×3 IMPLANT
DEVICE MYOSURE LITE (MISCELLANEOUS) IMPLANT
DEVICE MYOSURE REACH (MISCELLANEOUS) IMPLANT
DILATOR CANAL MILEX (MISCELLANEOUS) IMPLANT
DRSG TELFA 3X8 NADH (GAUZE/BANDAGES/DRESSINGS) ×2 IMPLANT
ELECT DISPERSIVE SONATA (ELECTRODE) ×6 IMPLANT
ELECT REM PT RETURN 9FT ADLT (ELECTROSURGICAL)
ELECTRODE REM PT RTRN 9FT ADLT (ELECTROSURGICAL) IMPLANT
GAUZE 4X4 16PLY ~~LOC~~+RFID DBL (SPONGE) ×4 IMPLANT
GLOVE SURG ENC MOIS LTX SZ6 (GLOVE) ×3 IMPLANT
GOWN STRL REUS W/ TWL LRG LVL3 (GOWN DISPOSABLE) ×4 IMPLANT
GOWN STRL REUS W/TWL LRG LVL3 (GOWN DISPOSABLE) ×4
HANDPIECE RFA SONATA (MISCELLANEOUS) ×3 IMPLANT
IV NS IRRIG 3000ML ARTHROMATIC (IV SOLUTION) ×4 IMPLANT
KIT PROCEDURE FLUENT (KITS) ×3 IMPLANT
KIT TURNOVER CYSTO (KITS) ×3 IMPLANT
MYOSURE XL FIBROID (MISCELLANEOUS) ×2
PACK VAGINAL MINOR WOMEN LF (CUSTOM PROCEDURE TRAY) ×3 IMPLANT
PAD DRESSING TELFA 3X8 NADH (GAUZE/BANDAGES/DRESSINGS) ×2 IMPLANT
PAD OB MATERNITY 4.3X12.25 (PERSONAL CARE ITEMS) ×3 IMPLANT
PAD PREP 24X48 CUFFED NSTRL (MISCELLANEOUS) ×3 IMPLANT
SEAL ROD LENS SCOPE MYOSURE (ABLATOR) ×3 IMPLANT
SLEEVE SCD COMPRESS KNEE MED (STOCKING) ×3 IMPLANT
SYSTEM TISS REMOVAL MYOSURE XL (MISCELLANEOUS) IMPLANT
TOWEL OR 17X26 10 PK STRL BLUE (TOWEL DISPOSABLE) ×6 IMPLANT

## 2022-03-12 NOTE — Interval H&P Note (Deleted)
History and Physical Interval Note: ? ?03/12/2022 ?9:50 AM ? ?Leslie [name]  has presented today for surgery, with the diagnosis of Fibroids ?Menorrhagia.  The various methods of treatment have been discussed with the patient and family. After consideration of risks, benefits and other options for treatment, the patient has consented to  Procedure(s): ?Radio Frequency Ablation with Sonata (N/A) ?DILATATION & CURETTAGE/HYSTEROSCOPY WITH MYOSURE (N/A) as a surgical intervention.  The patient's history has been reviewed, patient examined, no change in status, stable for surgery.  I have reviewed the patient's chart and labs.  Questions were answered to the patient's satisfaction.   ? ? ?Radene Gunning ? ? ?

## 2022-03-12 NOTE — Anesthesia Procedure Notes (Signed)
Procedure Name: LMA Insertion ?Date/Time: 03/12/2022 10:24 AM ?Performed by: Suan Halter, CRNA ?Pre-anesthesia Checklist: Patient identified, Emergency Drugs available, Suction available and Patient being monitored ?Patient Re-evaluated:Patient Re-evaluated prior to induction ?Oxygen Delivery Method: Circle system utilized ?Preoxygenation: Pre-oxygenation with 100% oxygen ?Induction Type: IV induction ?Ventilation: Mask ventilation without difficulty ?LMA: LMA inserted ?LMA Size: 4.0 ?Number of attempts: 1 ?Airway Equipment and Method: Bite block ?Placement Confirmation: positive ETCO2 ?Tube secured with: Tape ?Dental Injury: Teeth and Oropharynx as per pre-operative assessment  ? ? ? ? ?

## 2022-03-12 NOTE — Transfer of Care (Signed)
Immediate Anesthesia Transfer of Care Note ? ?Patient: Leslie [name] ? ?Procedure(s) Performed: Procedure(s) (LRB): ?Radio Frequency Ablation with Sonata (N/A) ?DILATATION & CURETTAGE/HYSTEROSCOPY WITH MYOSURE (N/A) ? ?Patient Location: PACU ? ?Anesthesia Type: General ? ?Level of Consciousness: awake, oriented, sedated and patient cooperative ? ?Airway & Oxygen Therapy: Patient Spontanous Breathing and Patient connected to face mask oxygen ? ?Post-op Assessment: Report given to PACU RN and Post -op Vital signs reviewed and stable ? ?Post vital signs: Reviewed and stable ? ?Complications: No apparent anesthesia complications ?Last Vitals:  ?Vitals Value Taken Time  ?BP 154/97 03/12/22 1215  ?Temp    ?Pulse    ?Resp 23 03/12/22 1218  ?SpO2    ?Vitals shown include unvalidated device data. ? ?Last Pain:  ?Vitals:  ? 03/12/22 0907  ?TempSrc: Oral  ?PainSc: 0-No pain  ?   ? ?Patients Stated Pain Goal: 2 (03/12/22 6333) ? ?Complications: No notable events documented. ?

## 2022-03-12 NOTE — Op Note (Signed)
Preop: Symptomatic fibroid ?Postop: Same ?Procedure: D&C, hysteroscopy, sonata ablation of fibroid, myosure resection of fibroid ?Surgeon: Dr. Damita Dunnings ?Assist: None ?EBL 150 cc ?UOP: not drained ?Specimens: uterine fibroid ?Complications: none ? ?Findings: Normal external female genitalia. Normal appearing cervix and vagina. Normal ostia visualized bilaterally. Uterine fibroid noted in the right ostia ? ?Description of the procedure: ?Informed consent reviewed and signed prior to the procedure after the patient was given opportunity to ask questions. The patient was taken to the operating room where general anesthesia was found to be adequate. A timeout was performed confirming the patient and the procedure.  ? ?She was prepared and draped in the dorsal lithotomy fashion.  ? ?Weighted speculum placed into the patient's vagina. Single tooth tenaculum applied to the 12 o'clock position of the cervix. Uterus sounded to 9 cm.  Cervix progressively dilated to a 27 Pratt. 30 degree hysteroscope was inserted into the cavity with the aforementioned findings noted. Endometrial curetting was collected with an 0 curette.  ? ?The Sonata headpiece was then inserted and a compete uterine survey was performed with the ultrasound. Sonata ablation was then performed: ? ?Fibroid 1:  ?3.1 x 2.3 for 3:12  ?3.2 x 2.3 for 3:18 ?2.4 x 1.7 for 1:54 ? ?Fibroid 2:  ?2.0 x 1.3 for 1:06 ? ?All cycles were carried out under direct ultrasound intrauterine guidance and visualization with the ablation guide noted within the sera at all times. The fibroids treated appeared ablated with US guidance with outgassing noted by [place] appearance.  ? ?Hysteroscopy was performed following the procedure to assure ablation of the submucosal side. Myosure resection then performed to the stalk. Several passes done with polyp forceps.  ? ?Hemostatic at the end of the procedure. Procedure completed. All instruments removed. Counts correct x2. ? ?Pt taken awake to  recovery room in stable condition. She tolerated the procedure well.  ? ?Radene Gunning, MD ?Attending [place], Faculty Practice ?[facility] ? ?

## 2022-03-12 NOTE — Discharge Instructions (Signed)

## 2022-03-12 NOTE — Anesthesia Postprocedure Evaluation (Signed)
Anesthesia Post Note ? ?Patient: Leslie [name] ? ?Procedure(s) Performed: Radio Frequency Ablation with Sonata (Uterus) ?DILATATION & CURETTAGE/HYSTEROSCOPY WITH MYOSURE (Uterus) ? ?  ? ?Patient location during evaluation: PACU ?Anesthesia Type: General ?Level of consciousness: awake and alert ?Pain management: pain level controlled ?Vital Signs Assessment: post-procedure vital signs reviewed and stable ?Respiratory status: spontaneous breathing, nonlabored ventilation and respiratory function stable ?Cardiovascular status: stable and blood pressure returned to baseline ?Anesthetic complications: no ? ? ?No notable events documented. ? ?Last Vitals:  ?Vitals:  ? 03/12/22 1245 03/12/22 1300  ?BP: (!) 146/93 (!) 145/88  ?Pulse: 74 74  ?Resp: (!) 22 20  ?Temp:  36.5 ?C  ?SpO2: 96% 96%  ?  ?Last Pain:  ?Vitals:  ? 03/12/22 1245  ?TempSrc:   ?PainSc: 3   ? ? ?  ?  ?  ?  ?  ?  ? ?Audry Pili ? ? ? ? ?

## 2022-03-12 NOTE — Anesthesia Preprocedure Evaluation (Addendum)
Anesthesia Evaluation  ?Patient identified by MRN, date of birth, ID band ?Patient awake ? ? ? ?Reviewed: ?Allergy & Precautions, NPO status , Patient's Chart, lab work & pertinent test results ? ?History of Anesthesia Complications ?Negative for: history of anesthetic complications ? ?Airway ?Mallampati: II ? ?TM Distance: >3 FB ?Neck ROM: Full ? ? ? Dental ? ?(+) Dental Advisory Given ?  ?Pulmonary ?asthma ,  ?  ?Pulmonary exam normal ? ? ? ? ? ? ? Cardiovascular ?hypertension, Pt. on medications and Pt. on home beta blockers ?Normal cardiovascular exam ? ? ?  ?Neuro/Psych ? Headaches, PSYCHIATRIC DISORDERS Anxiety   ? GI/Hepatic ?negative GI ROS, (+)  ?  ? substance abuse ? alcohol use,   ?Endo/Other  ? ?Obesity ? ? Renal/GU ?negative Renal ROS  ? ?  ?Musculoskeletal ? ?(+) Arthritis ,  ? Abdominal ?  ?Peds ? Hematology ? ?(+) Blood dyscrasia, Sickle cell trait and anemia ,   ?Anesthesia Other Findings ? ? Reproductive/Obstetrics ? ?  ? ? ? ? ? ? ? ? ? ? ? ? ? ?  ?  ? ? ? ? ? ? ? ?Anesthesia Physical ?Anesthesia Plan ? ?ASA: 3 ? ?Anesthesia Plan: General  ? ?Post-op Pain Management: Tylenol PO (pre-op)*  ? ?Induction: Intravenous ? ?PONV Risk Score and Plan: 3 and Treatment may vary due to age or medical condition, Ondansetron, Dexamethasone and Midazolam ? ?Airway Management Planned: LMA ? ?Additional Equipment: None ? ?Intra-op Plan:  ? ?Post-operative Plan: Extubation in OR ? ?Informed Consent: I have reviewed the patients History and Physical, chart, labs and discussed the procedure including the risks, benefits and alternatives for the proposed anesthesia with the patient or authorized representative who has indicated his/her understanding and acceptance.  ? ? ? ?Dental advisory given ? ?Plan Discussed with: CRNA and Anesthesiologist ? ?Anesthesia Plan Comments:   ? ? ? ? ? ?Anesthesia Quick Evaluation ? ?

## 2022-03-13 ENCOUNTER — Encounter: Payer: Self-pay | Admitting: *Deleted

## 2022-03-13 ENCOUNTER — Encounter (HOSPITAL_BASED_OUTPATIENT_CLINIC_OR_DEPARTMENT_OTHER): Payer: Self-pay | Admitting: Obstetrics and Gynecology

## 2022-03-13 ENCOUNTER — Telehealth: Payer: Self-pay | Admitting: Cardiology

## 2022-03-13 LAB — SURGICAL PATHOLOGY

## 2022-03-13 NOTE — Telephone Encounter (Signed)
Patient is returning call. She states her concern was with "hx of drug and alcohol use" in her chart. She would like to have this removed as she states it is untrue. She repeatedly mentioned Dr. Aundra Dubin being the provider who put this in her records. I do not see anything documented with Dr. Aundra Dubin recently, but she states she saw him in the hospital yesterday. I transferred to Dr. Herma Carson whether or not they are able to clarify. Is anyone able to advise on this matter? ?

## 2022-03-13 NOTE — Telephone Encounter (Signed)
?  Pt is calling and wanted to speak with a nurse regarding an information on her mychart stating hx of drug and alcohol posted - 2019, she said that is not true and wanted to be remove  ?
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
## 2022-03-14 ENCOUNTER — Encounter (INDEPENDENT_AMBULATORY_CARE_PROVIDER_SITE_OTHER): Payer: Self-pay | Admitting: *Deleted

## 2022-03-14 ENCOUNTER — Telehealth: Payer: Self-pay | Admitting: Family Medicine

## 2022-03-14 NOTE — Telephone Encounter (Signed)
Pt says she faxed over fmla paperwork for Dr Ethelene Hal to fill out yesterday 03/13/22. She would like the paperwork faxed to [phone] when completed.  ?

## 2022-03-14 NOTE — Telephone Encounter (Addendum)
If a patient would like to request an addendum be made to their medical record, they must submit a written request to our Medical Records team. To do so, they should visit our website: [email or web address] ? ?Called and spoke to the patient and provided her with the information below regarding how she can request an amendment to be made to her chart. MyChart message sent with the information as well. ? ?Steps: ?1- Go to the website: [email or web address] ?2- Scroll all the way to the bottom of the page and there's a form that must be submitted to the HIM department to take care of ?

## 2022-03-15 ENCOUNTER — Inpatient Hospital Stay: Payer: BC Managed Care – PPO

## 2022-03-15 ENCOUNTER — Other Ambulatory Visit: Payer: Self-pay | Admitting: Cardiology

## 2022-03-17 ENCOUNTER — Ambulatory Visit: Payer: BC Managed Care – PPO | Admitting: Family Medicine

## 2022-03-17 ENCOUNTER — Encounter: Payer: Self-pay | Admitting: Obstetrics and Gynecology

## 2022-03-17 NOTE — Telephone Encounter (Signed)
Forms received pending on Providers desk to me viewed and filled out.  ?

## 2022-03-18 ENCOUNTER — Other Ambulatory Visit: Payer: Self-pay | Admitting: Obstetrics and Gynecology

## 2022-03-18 ENCOUNTER — Encounter: Payer: Self-pay | Admitting: *Deleted

## 2022-03-18 DIAGNOSIS — D250 Submucous leiomyoma of uterus: Secondary | ICD-10-CM

## 2022-03-18 MED ORDER — OXYCODONE HCL 5 MG PO TABS
5.0000 mg | ORAL_TABLET | ORAL | 0 refills | Status: DC | PRN
Start: 2022-03-18 — End: 2022-06-27

## 2022-03-24 ENCOUNTER — Inpatient Hospital Stay: Payer: BC Managed Care – PPO

## 2022-03-24 ENCOUNTER — Inpatient Hospital Stay: Payer: BC Managed Care – PPO | Admitting: Hematology and Oncology

## 2022-03-26 ENCOUNTER — Telehealth: Payer: Self-pay | Admitting: *Deleted

## 2022-03-26 MED ORDER — VALSARTAN-HYDROCHLOROTHIAZIDE 160-12.5 MG PO TABS: 1.0000 | ORAL_TABLET | Freq: Every day | ORAL | 3 refills | Status: DC

## 2022-03-26 NOTE — Telephone Encounter (Signed)
Patient states she would like to have the correct dosage medication  160/12.5 mg --called into the pharmacy rather than cutting medication 1/2 tablet of  of 320 /25 mg.  ? ? Medication e-sent to pharmacy. ?

## 2022-03-30 ENCOUNTER — Encounter: Payer: Self-pay | Admitting: Family Medicine

## 2022-03-31 NOTE — Telephone Encounter (Signed)
Forms faxed patient aware.  ?
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
## 2022-04-09 ENCOUNTER — Encounter (HOSPITAL_COMMUNITY): Payer: Self-pay | Admitting: Emergency Medicine

## 2022-04-10 ENCOUNTER — Encounter: Payer: Self-pay | Admitting: Obstetrics and Gynecology

## 2022-04-10 ENCOUNTER — Ambulatory Visit (INDEPENDENT_AMBULATORY_CARE_PROVIDER_SITE_OTHER): Payer: BC Managed Care – PPO | Admitting: Obstetrics and Gynecology

## 2022-04-10 VITALS — BP 167/98 | HR 89 | Resp 16 | Ht 69.0 in | Wt 208.0 lb

## 2022-04-10 DIAGNOSIS — Z09 Encounter for follow-up examination after completed treatment for conditions other than malignant neoplasm: Secondary | ICD-10-CM

## 2022-04-14 ENCOUNTER — Encounter: Payer: Self-pay | Admitting: Obstetrics and Gynecology
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
## 2022-04-16 ENCOUNTER — Telehealth: Payer: Self-pay | Admitting: Cardiology

## 2022-04-16 NOTE — Telephone Encounter (Signed)
Pt has app scheduled for 05/14/22 and would like an order put in for labs to be drawn. Please advise ?

## 2022-04-16 NOTE — Telephone Encounter (Signed)
Returned call to patient who states that she would like blood work if possible prior to her appointment on 5/31 with the PA Sande Rives. Advised patient that I would forward message over to callie for her to advise on the need for blood work. Patient verbalized understanding.  ?

## 2022-04-17 NOTE — Telephone Encounter (Signed)
Leslie [name]. I would prefer not to order anything until after I have seen her because often what I order is dependent on what is said during the visit. Thanks so much! ?

## 2022-04-17 NOTE — Telephone Encounter (Signed)
Contacted patient, made aware of Leslie [name] recommendation to wait until her visit.  ? ?Patient states she is not sure if she can even come to that appointment but she is going to talk to her boss and call back if she needs to have it rescheduled.  ? ? ?

## 2022-04-23 ENCOUNTER — Encounter: Payer: Self-pay | Admitting: Family Medicine

## 2022-04-23 ENCOUNTER — Telehealth: Payer: BC Managed Care – PPO | Admitting: Physician Assistant

## 2022-04-23 DIAGNOSIS — J029 Acute pharyngitis, unspecified: Secondary | ICD-10-CM

## 2022-04-23 MED ORDER — LIDOCAINE VISCOUS HCL 2 % MT SOLN: OROMUCOSAL | 0 refills | Status: DC

## 2022-04-23 MED ORDER — BENZONATATE 100 MG PO CAPS: 100.0000 mg | ORAL_CAPSULE | Freq: Three times a day (TID) | ORAL | 0 refills | Status: DC | PRN

## 2022-04-23 MED ORDER — AMOXICILLIN 500 MG PO CAPS: 500.0000 mg | ORAL_CAPSULE | Freq: Two times a day (BID) | ORAL | 0 refills | Status: DC

## 2022-04-23 NOTE — Addendum Note (Signed)
Addended by: Mar Daring on: 04/23/2022 02:45 PM ? ? Modules accepted: Orders ? ?

## 2022-04-23 NOTE — Progress Notes (Signed)
?E-Visit for Sore Throat ? ?We are sorry that you are not feeling well.  Here is how we plan to help! ? ?Your symptoms indicate a likely viral infection (Pharyngitis).   Pharyngitis is inflammation in the back of the throat which can cause a sore throat, scratchiness and sometimes difficulty swallowing.   Pharyngitis is typically caused by a respiratory virus and will just run its course.  Please keep in mind that your symptoms could last up to 10 days.  For throat pain, we recommend over the counter oral pain relief medications such as acetaminophen or aspirin, or anti-inflammatory medications such as ibuprofen or naproxen sodium.  Topical treatments such as oral throat lozenges or sprays may be used as needed.  Avoid close contact with loved ones, especially the very young and elderly.  Remember to wash your hands thoroughly throughout the day as this is the number one way to prevent the spread of infection and wipe down door knobs and counters with disinfectant. ? ?I will prescribe Viscous lidocaine for the sore throat and Tessalon perles for the cough. ? ?After careful review of your answers, I would not recommend an antibiotic for your condition.  Antibiotics should not be used to treat conditions that we suspect are caused by viruses like the virus that causes the common cold or flu. However, some people can have Strep with atypical symptoms. You may need formal testing in clinic or office to confirm if your symptoms continue or worsen. ? ?Providers prescribe antibiotics to treat infections caused by bacteria. Antibiotics are very powerful in treating bacterial infections when they are used properly.  To maintain their effectiveness, they should be used only when necessary.  Overuse of antibiotics has resulted in the development of super bugs that are resistant to treatment!   ? ?Home Care: ?Only take medications as instructed by your medical team. ?Do not drink alcohol while taking these medications. ?A  steam or ultrasonic humidifier can help congestion.  You can place a towel over your head and breathe in the steam from hot water coming from a faucet. ?Avoid close contacts especially the very young and the elderly. ?Cover your mouth when you cough or sneeze. ?Always remember to wash your hands. ? ?Get Help Right Away If: ?You develop worsening fever or throat pain. ?You develop a severe head ache or visual changes. ?Your symptoms persist after you have completed your treatment plan. ? ?Make sure you ?Understand these instructions. ?Will watch your condition. ?Will get help right away if you are not doing well or get worse. ? ? ?Thank you for choosing an e-visit. ? ?Your e-visit answers were reviewed by a board certified advanced clinical practitioner to complete your personal care plan. Depending upon the condition, your plan could have included both over the counter or prescription medications. ? ?Please review your pharmacy choice. Make sure the pharmacy is open so you can pick up prescription now. If there is a problem, you may contact your provider through [employer] and have the prescription routed to another pharmacy.  Your safety is important to [place]. If you have drug allergies check your prescription carefully.  ? ?For the next 24 hours you can use MyChart to ask questions about today's visit, request a non-urgent call back, or ask for a work or school excuse. ?You will get an email in the next two days asking about your experience. I hope that your e-visit has been valuable and will speed your recovery. ? ? ?I provided 5  minutes of non face-to-face time during this encounter for chart review and documentation.  ? ?
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
## 2022-05-04 ENCOUNTER — Telehealth: Payer: BC Managed Care – PPO | Admitting: Emergency Medicine

## 2022-05-04 DIAGNOSIS — S39012A Strain of muscle, fascia and tendon of lower back, initial encounter: Secondary | ICD-10-CM

## 2022-05-05 MED ORDER — CYCLOBENZAPRINE HCL 5 MG PO TABS: 5.0000 mg | ORAL_TABLET | Freq: Three times a day (TID) | ORAL | 0 refills | Status: DC | PRN

## 2022-05-05 NOTE — Progress Notes (Signed)
We are sorry that you are not feeling well.  Here is how we plan to help!  Based on what you have shared with me it looks like you mostly have acute back pain.  Acute back pain is defined as musculoskeletal pain that can resolve in 1-3 weeks with conservative treatment.  I have prescribed flexeril '5mg'$ . You can take flexeril up to 3 times a day, but please keep in mind that muscle relaxer's can cause fatigue and should not be taken while at work or driving.  Back pain is very common.  The pain often gets better over time.  The cause of back pain is usually not dangerous.  Most people can learn to manage their back pain on their own.  Home Care Stay active.  Start with short walks on flat ground if you can.  Try to walk farther each day. Do not sit, drive or stand in one place for more than 30 minutes.  Do not stay in bed. Do not avoid exercise or work.  Activity can help your back heal faster. Be careful when you bend or lift an object.  Bend at your knees, keep the object close to you, and do not twist. Sleep on a firm mattress.  Lie on your side, and bend your knees.  If you lie on your back, put a pillow under your knees. Only take medicines as told by your doctor. Put ice on the injured area. Put ice in a plastic bag Place a towel between your skin and the bag Leave the ice on for 15-20 minutes, 3-4 times a day for the first 2-3 days. 210 After that, you can switch between ice and heat packs. Ask your doctor about back exercises or massage. Avoid feeling anxious or stressed.  Find good ways to deal with stress, such as exercise.  Get Help Right Way If: Your pain does not go away with rest or medicine. Your pain does not go away in 1 week. You have new problems. You do not feel well. The pain spreads into your legs. You cannot control when you poop (bowel movement) or pee (urinate) You feel sick to your stomach (nauseous) or throw up (vomit) You have belly (abdominal) pain. You feel  like you may pass out (faint). If you develop a fever.  Make Sure you: Understand these instructions. Will watch your condition Will get help right away if you are not doing well or get worse.  Your e-visit answers were reviewed by a board certified advanced clinical practitioner to complete your personal care plan.  Depending on the condition, your plan could have included both over the counter or prescription medications.  If there is a problem please reply  once you have received a response from your provider.  Your safety is important to [place].  If you have drug allergies check your prescription carefully.    You can use MyChart to ask questions about today's visit, request a non-urgent call back, or ask for a work or school excuse for 24 hours related to this e-Visit. If it has been greater than 24 hours you will need to follow up with your provider, or enter a new e-Visit to address those concerns.  You will get an e-mail in the next two days asking about your experience.  I hope that your e-visit has been valuable and will speed your recovery. Thank you for using e-visits.  I have spent 5 minutes in review of e-visit questionnaire, review and updating patient  chart, medical decision making and response to patient.   Willeen Cass, PhD, FNP-BC

## 2022-05-14 ENCOUNTER — Encounter: Payer: Self-pay | Admitting: Student

## 2022-05-14 ENCOUNTER — Ambulatory Visit (INDEPENDENT_AMBULATORY_CARE_PROVIDER_SITE_OTHER): Payer: BC Managed Care – PPO | Admitting: Student

## 2022-05-14 VITALS — BP 168/102 | HR 79 | Ht 70.0 in | Wt 210.0 lb

## 2022-05-14 DIAGNOSIS — E785 Hyperlipidemia, unspecified: Secondary | ICD-10-CM

## 2022-05-14 DIAGNOSIS — I5189 Other ill-defined heart diseases: Secondary | ICD-10-CM

## 2022-05-14 DIAGNOSIS — I10 Essential (primary) hypertension: Secondary | ICD-10-CM | POA: Diagnosis not present

## 2022-05-14 DIAGNOSIS — R252 Cramp and spasm: Secondary | ICD-10-CM

## 2022-05-14 NOTE — Patient Instructions (Signed)
Medication Instructions:  Increase Amlodipine 10 mg ( Take 1 Tablet Daily). *If you need a refill on your cardiac medications before your next appointment, please call your pharmacy*   Lab Work: BMET, Magnesium Level If you have labs (blood work) drawn today and your tests are completely normal, you will receive your results only by: [place] (if you have MyChart) OR A paper copy in the mail If you have any lab test that is abnormal or we need to change your treatment, we will call you to review the results.   Testing/Procedures: No Testing   Follow-Up: At [facility], you and your health needs are our priority.  As part of our continuing mission to provide you with exceptional heart care, we have created designated Provider Care Teams.  These Care Teams include your primary Cardiologist (physician) and Advanced Practice Providers (APPs -  Physician Assistants and Nurse Practitioners) who all work together to provide you with the care you need, when you need it.  We recommend signing up for the patient portal called "MyChart".  Sign up information is provided on this After Visit Summary.  MyChart is used to connect with patients for Virtual Visits (Telemedicine).  Patients are able to view lab/test results, encounter notes, upcoming appointments, etc.  Non-urgent messages can be sent to your provider as well.   To learn more about what you can do with MyChart, go to [email or web address].    Your next appointment:   6 month(s)  The format for your next appointment:   In Person  Provider:   Kirk Ruths, MD       Important Information About Sugar

## 2022-05-15 ENCOUNTER — Encounter: Payer: Self-pay | Admitting: Family Medicine

## 2022-05-15 LAB — BASIC METABOLIC PANEL
BUN/Creatinine Ratio: 21 (ref 9–23)
BUN: 18 mg/dL (ref 6–24)
CO2: 25 mmol/L (ref 20–29)
Calcium: 9.9 mg/dL (ref 8.7–10.2)
Chloride: 103 mmol/L (ref 96–106)
Creatinine, Ser: 0.86 mg/dL (ref 0.57–1.00)
Glucose: 96 mg/dL (ref 70–99)
Potassium: 3.5 mmol/L (ref 3.5–5.2)
Sodium: 143 mmol/L (ref 134–144)
eGFR: 84 mL/min/{1.73_m2} (ref >59)

## 2022-05-15 LAB — MAGNESIUM: Magnesium: 2.1 mg/dL (ref 1.6–2.3)

## 2022-05-21 ENCOUNTER — Other Ambulatory Visit: Payer: Self-pay | Admitting: Student

## 2022-05-21 MED ORDER — AMLODIPINE BESYLATE 5 MG PO TABS: 5.0000 mg | ORAL_TABLET | Freq: Every day | ORAL | 3 refills | Status: DC

## 2022-05-21 MED ORDER — VALSARTAN-HYDROCHLOROTHIAZIDE 160-12.5 MG PO TABS: 2.0000 | ORAL_TABLET | Freq: Every day | ORAL | 3 refills | Status: DC

## 2022-05-21 NOTE — Telephone Encounter (Addendum)
Called pt she states that she already decreased the amlodipine to '5mg'$ . She states that she is currently taking 1/4-1/2 pill of her Valsartan-HCTZ daily. She states she absolutely cannot take 2 pills when she took a whole pill she had chest pain, she WILL not increase Valsartan-HCTZ. Please advise

## 2022-05-21 NOTE — Telephone Encounter (Signed)
Yes, OK to decrease Amlodipine back down to '5mg'$  daily daily and let's increase her Valsartan-HCTZ (Diovan) to 320-12.'5mg'$  once daily. She will then need a repeat BMET in 1-2 weeks. It is also important that she take her Labetalol twice a day.   Thank you!

## 2022-05-21 NOTE — Addendum Note (Signed)
Addended by: Waylan Rocher on: 05/21/2022 05:24 PM   Modules accepted: Orders

## 2022-05-22 NOTE — Telephone Encounter (Signed)
So I am confused. I thought the message below said she wanted to increase the Valsartan again like we talked about at her visit.What exactly is she taking right now? What is she taking 1/4 to 1/2 pill of?  I wasn't going to have her take 2 pills of her current Valsartan-HCTZ tablet because I was only going to increase the Valsartan component first. If she is agreeable to this, we will need to send her in a new prescription with the 320-12.'5mg'$  tablet.   Thank you!

## 2022-05-23 NOTE — Telephone Encounter (Signed)
Options are limited then because she has trouble taking medications twice daily? Is she taking her Labetalol twice daily right now? We could try adding Hydralazine but she is going to have to take that at least twice a day as well.

## 2022-05-24 NOTE — Telephone Encounter (Signed)
Actually Sharyn Lull, let's start Spironolactone '25mg'$  daily. I think this is a better option because it is once daily and should also help with her leg swelling. She will need to come back in for a BMET in 1-2 weeks after starting this.  Thanks so much! Ernesteen Mihalic

## 2022-05-27 MED ORDER — SPIRONOLACTONE 25 MG PO TABS: 25.0000 mg | ORAL_TABLET | Freq: Every day | ORAL | 3 refills | Status: DC

## 2022-05-27 NOTE — Addendum Note (Signed)
Addended by: Waylan Rocher on: 05/27/2022 09:29 AM   Modules accepted: Orders

## 2022-05-27 NOTE — Telephone Encounter (Signed)
Pt informed of providers result & recommendations. Pt verbalized understanding. Pt states that she is taking 4 medications and "cannot take another. She states that she will stop the Amlodipine and start Spironolactone '25MG'$ .  Amlodipine taken off medication list

## 2022-06-09 ENCOUNTER — Other Ambulatory Visit: Payer: Self-pay | Admitting: Family Medicine

## 2022-06-09 DIAGNOSIS — F419 Anxiety disorder, unspecified: Secondary | ICD-10-CM

## 2022-06-12 ENCOUNTER — Other Ambulatory Visit: Payer: Self-pay | Admitting: Family Medicine

## 2022-06-12 DIAGNOSIS — D508 Other iron deficiency anemias: Secondary | ICD-10-CM

## 2022-06-13 NOTE — Telephone Encounter (Signed)
It looks like her RBC not her hemoglobin was 3. Her hemoglobin was in the 9 to 10 range. Her surgery was back in March. I would have her follow-up with her PCP to see if repeat CBC is needed at this point because if her hemoglobin is significantly low they would be the ones to manage that.  Thanks!

## 2022-06-13 NOTE — Telephone Encounter (Signed)
No answer on patient's phone. [place] eto leave message. Sent in [place].

## 2022-06-16 ENCOUNTER — Encounter: Payer: Self-pay | Admitting: Family Medicine

## 2022-06-17 ENCOUNTER — Other Ambulatory Visit: Payer: Self-pay | Admitting: Cardiology

## 2022-06-23 ENCOUNTER — Telehealth: Payer: Self-pay | Admitting: Family Medicine

## 2022-06-23 NOTE — Telephone Encounter (Signed)
Spoke with patient grand daughter who states that patient is currently at home doing well, they a requesting order for Home Health. Patient has an appointment for labs and follow up this week.

## 2022-06-23 NOTE — Telephone Encounter (Signed)
Patient due for follow up, appointment scheduled

## 2022-06-23 NOTE — Telephone Encounter (Signed)
Pt said she needs labs. She is bringing her Granfather on Friday and would like to do this then if possibel.

## 2022-06-27 ENCOUNTER — Ambulatory Visit (INDEPENDENT_AMBULATORY_CARE_PROVIDER_SITE_OTHER): Payer: BC Managed Care – PPO | Admitting: Family Medicine

## 2022-06-27 ENCOUNTER — Encounter: Payer: Self-pay | Admitting: Family Medicine

## 2022-06-27 VITALS — BP 160/84 | HR 73 | Temp 97.0°F | Ht 70.0 in | Wt 205.6 lb

## 2022-06-27 DIAGNOSIS — Z818 Family history of other mental and behavioral disorders: Secondary | ICD-10-CM | POA: Diagnosis not present

## 2022-06-27 DIAGNOSIS — D509 Iron deficiency anemia, unspecified: Secondary | ICD-10-CM | POA: Diagnosis not present

## 2022-06-27 DIAGNOSIS — F419 Anxiety disorder, unspecified: Secondary | ICD-10-CM

## 2022-06-27 LAB — CBC
HCT: 36.7 % (ref 36.0–46.0)
Hemoglobin: 12 g/dL (ref 12.0–15.0)
MCHC: 32.6 g/dL (ref 30.0–36.0)
MCV: 80.4 fl (ref 78.0–100.0)
Platelets: 63 10*3/uL — ABNORMAL LOW (ref 150.0–400.0)
RBC: 4.57 Mil/uL (ref 3.87–5.11)
RDW: 18.4 % — ABNORMAL HIGH (ref 11.5–15.5)
WBC: 5.7 10*3/uL (ref 4.0–10.5)

## 2022-06-27 MED ORDER — PAROXETINE HCL ER 25 MG PO TB24: 25.0000 mg | ORAL_TABLET | Freq: Every day | ORAL | 1 refills | Status: DC

## 2022-06-27 NOTE — Progress Notes (Signed)
Established Patient Office Visit  Subjective   Patient ID: Leslie [name], female    DOB: November 10, 1976  Age: 46 y.o. MRN: [id number]  Chief Complaint  Patient presents with   Follow-up    Routine follow up, states she felt very shaky last week. Needing labs today patient fasting.     HPI here with her husband and father for follow-up of iron deficiency anemia.  Hopefully this is improving.  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  She continues with iron infusions per hematology.  Continues with Paxil for anxiety.  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  Blood pressure elevated today.  She says it is much lower at home and averages in the 120s over 80s.    Review of Systems  Constitutional:  Negative for chills, diaphoresis, malaise/fatigue and weight loss.  HENT: Negative.    Eyes: Negative.  Negative for blurred vision and double vision.  Cardiovascular:  Negative for chest pain.  Gastrointestinal:  Negative for abdominal pain.  Genitourinary: Negative.   Musculoskeletal:  Negative for falls and myalgias.  Neurological:  Negative for speech change, loss of consciousness and weakness.  Psychiatric/Behavioral: Negative.        06/27/2022    9:24 AM 06/27/2022    8:32 AM 12/19/2019    3:08 PM  Depression screen PHQ 2/9  Decreased Interest 0 0 0  Down, Depressed, Hopeless 0 0 0  PHQ - 2 Score 0 0 0  Altered sleeping 3    Tired, decreased energy 0    Change in appetite 0    Feeling bad or failure about yourself  0    Trouble concentrating 0    Moving slowly or fidgety/restless 0    Suicidal thoughts 0    PHQ-9 Score 3    Difficult doing work/chores Not difficult at all         Objective:     BP (!) 160/84 (BP Location: Right Arm, Patient Position: Sitting, Cuff Size: Large)   Pulse 73   Temp (!) 97 F (36.1 C) (Temporal)   Ht '5\' 10"'$  (1.778 m)   Wt 205 lb 9.6 oz (93.3 kg)   SpO2 97%   BMI 29.50 kg/m    Physical  Exam Constitutional:      General: She is not in acute distress.    Appearance: Normal appearance. She is not ill-appearing, toxic-appearing or diaphoretic.  HENT:     Head: Normocephalic and atraumatic.     Right Ear: External ear normal.     Left Ear: External ear normal.  Eyes:     General: No scleral icterus.       Right eye: No discharge.        Left eye: No discharge.     Extraocular Movements: Extraocular movements intact.     Conjunctiva/sclera: Conjunctivae normal.  Cardiovascular:     Rate and Rhythm: Normal rate and regular rhythm.  Pulmonary:     Effort: Pulmonary effort is normal. No respiratory distress.     Breath sounds: Normal breath sounds.  Abdominal:     General: Bowel sounds are normal.     Tenderness: There is no abdominal tenderness. There is no guarding.  Musculoskeletal:     Cervical back: No rigidity or tenderness.  Skin:    General: Skin is warm and dry.  Neurological:     Mental Status: She is alert and oriented to person, place, and time.  Psychiatric:        Mood and Affect: Mood normal.        Behavior: Behavior normal.      No results found for any visits on 06/27/22.    The ASCVD Risk score (Arnett DK, et al., 2019) failed to calculate for the following reasons:   Cannot find a previous HDL lab   Cannot find a previous total cholesterol lab    Assessment & Plan:   Problem List Items Addressed This Visit       Other   Anxiety   Relevant Medications   PARoxetine (PAXIL-CR) 25 MG 24 hr tablet   Iron deficiency anemia - Primary   Relevant Orders   CBC   Iron, TIBC and Ferritin Panel   Family history of stress    Return in about 6 months (around 12/28/2022).  Continue Paxil.  May need referral back to hematology pending hemoglobin levels.  Hopefully they are removed.  Follow-up with cardiology for elevated blood pressure.  [name], MD

## 2022-06-28 LAB — IRON,TIBC AND FERRITIN PANEL
%SAT: 18 % (calc) (ref 16–45)
Ferritin: 30 ng/mL (ref 16–232)
Iron: 72 ug/dL (ref 40–190)
TIBC: 390 mcg/dL (calc) (ref 250–450)

## 2022-07-08 ENCOUNTER — Other Ambulatory Visit: Payer: Self-pay

## 2022-07-08 ENCOUNTER — Encounter: Payer: Self-pay | Admitting: Hematology and Oncology

## 2022-07-08 ENCOUNTER — Inpatient Hospital Stay: Payer: BC Managed Care – PPO | Attending: Hematology and Oncology | Admitting: Hematology and Oncology

## 2022-07-08 DIAGNOSIS — D696 Thrombocytopenia, unspecified: Secondary | ICD-10-CM | POA: Diagnosis not present

## 2022-07-08 DIAGNOSIS — D693 Immune thrombocytopenic purpura: Secondary | ICD-10-CM | POA: Diagnosis not present

## 2022-07-08 DIAGNOSIS — Z862 Personal history of diseases of the blood and blood-forming organs and certain disorders involving the immune mechanism: Secondary | ICD-10-CM | POA: Insufficient documentation

## 2022-07-08 DIAGNOSIS — R110 Nausea: Secondary | ICD-10-CM | POA: Diagnosis not present

## 2022-07-08 DIAGNOSIS — D509 Iron deficiency anemia, unspecified: Secondary | ICD-10-CM

## 2022-07-08 NOTE — Assessment & Plan Note (Addendum)
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 I tried my best to explain the pathophysiology of ITP and treatment options including splenectomy, Promacta and others At this point in time, she does not need treatment as she is not symptomatic I find it very difficult to explain to the patient the rationale of observation I also find it very difficult to explain to her why she should not take prednisone to improve energy level If she wants to be treated for ITP, she will need to take prednisone daily Ultimately, she is in agreement to return here in 3 months for further follow-up  There is no contraindication to remain on antiplatelet agents or anticoagulants as long as the platelet is greater than 50,000.

## 2022-07-08 NOTE — Progress Notes (Signed)
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 I tried my best to explain the pathophysiology of ITP and treatment options including splenectomy, Promacta and others At this point in time, she does not need treatment as she is not symptomatic I find it very difficult to explain to the patient the rationale of observation I also find it very difficult to explain to her why she should not take prednisone to improve energy level If she wants to be treated for ITP, she will need to take prednisone daily Ultimately, she is in agreement to return here in 3 months for further follow-up  There is no contraindication to remain on antiplatelet agents or anticoagulants as long as the platelet is greater than 50,000.   No orders of the defined types were placed in this encounter.   The total time spent in the appointment was 20 minutes encounter with patients including review of chart and various tests results, discussions about plan of care and coordination of care plan   All questions were answered. The patient knows to call the clinic with any problems, questions or concerns. No barriers to learning was detected.    [name], MD 7/25/202310:58 AM  INTERVAL HISTORY: Leslie [name] 45 y.o. female returns for history of iron deficiency anemia as well as ITP She complains of fatigue We reviewed test results and discussed treatment options today She had no recent bleeding or menorrhagia  SUMMARY OF HEMATOLOGIC HISTORY:  She was found to have abnormal CBC from recent blood draw  Her recent CBC from 06/10/2021 showed low platelet count of 105 Most  recently, on January 06, 2022, her platelet count was 63,000 She denies recent bruising/bleeding, such as spontaneous epistaxis, hematuria, melena or hematochezia She has excessive menorrhagia since July of 2022 which she attributed to excessive menstruation after COVID-19 vaccination on July 31 However, on review of her blood work dated back to June 2022, she was noted to have mild thrombocytopenia At the end of May, the patient had severe bronchitis/respiratory infection which I think precipitated possible acute ITP event She was even recently evaluated by gynecologist and was told she had 3 uterine fibroids Hysterectomy was discussed The patient denies history of liver disease, exposure to heparin, history of cardiac murmur/prior cardiovascular surgery or recent new medications She denies prior blood or platelet transfusions The patient started taking more iron rich diet such as spinach and liver She has been taking oral iron supplement daily at night for several years She has severe chronic nausea Of note, she is taking potassium replacement therapy due to hypokalemia related to her diuretic therapy Several years ago, she was diagnosed with congestive heart failure and she takes aspirin on a regular basis She also take NSAIDs regularly She was started on Megace for several months without success of stopping her menorrhagia and recently was placed on the birth control She has chronic intermittent abdominal pain She menarche at the age of 79 and has no children She usually has regular menstruation until after July, she has been having frequent bleeding that really stops.  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  ALLERGIES:  is allergic to amlodipine, bystolic [nebivolol hcl], and lisinopril.  MEDICATIONS:  Current Outpatient Medications  Medication Sig Dispense Refill   albuterol (VENTOLIN HFA) 108 (90 Base) MCG/ACT inhaler INHALE 1-2 PUFFS BY MOUTH EVERY 6 HOURS AS NEEDED FOR WHEEZE OR SHORTNESS OF BREATH 1 each 0   Ascorbic Acid (VITAMIN C) 100 MG tablet Take 100 mg by mouth daily.     aspirin EC 81 MG tablet Take 81 mg by mouth daily. Swallow whole.     benzonatate (TESSALON) 100 MG capsule Take 1 capsule (100 mg total) by mouth 3 (three) times daily as needed. 30 capsule 0   cyclobenzaprine (FLEXERIL) 5 MG tablet Take 1 tablet (5 mg total) by mouth 3 (three) times daily as needed for muscle spasms. 21 tablet 0   ferrous sulfate 325 (65 FE) MG tablet TAKE 1 TABLET BY MOUTH 3 TIMES DAILY WITH MEALS. 270 tablet 1   labetalol (NORMODYNE) 200 MG tablet TAKE 1/2 TAB (100 MG) BY MOUTH EVERY MORNING AND 1 TAB EVERY EVENING. 135 tablet 2   Loratadine (CLARITIN PO) Take by mouth daily.     Melatonin 5 MG CHEW Chew 5 mg by mouth.     Multiple Vitamins-Minerals (AIRBORNE PO) Take by mouth.     mupirocin ointment (BACTROBAN) 2 % Apply 1 application topically 2 (two) times daily. 22 g 0   PARoxetine (PAXIL-CR) 25 MG 24 hr tablet Take 1 tablet (25 mg total) by mouth daily. 90 tablet 1   potassium chloride SA (KLOR-CON) 20 MEQ tablet Take 1 tablet (20 mEq total) by mouth 2 (two) times daily. Pt states tablet is too big and she cuts tablet in in 1/4 180 tablet 3   valsartan-hydrochlorothiazide (DIOVAN HCT) 160-12.5 MG tablet Take 2 tablets by mouth daily. 60 tablet 3   No current facility-administered medications for this visit.     REVIEW OF SYSTEMS:    Constitutional: Denies fevers, chills or night sweats Eyes: Denies blurriness of vision Ears, nose, mouth, throat, and face: Denies mucositis or sore throat Respiratory: Denies cough, dyspnea or wheezes Cardiovascular: Denies palpitation, chest discomfort or lower extremity swelling Gastrointestinal:  Denies nausea, heartburn or change in bowel habits Skin: Denies abnormal skin rashes Lymphatics: Denies new lymphadenopathy or easy bruising Neurological:Denies numbness, tingling or new weaknesses Behavioral/Psych: Mood is stable, no new changes  All other systems were reviewed with the patient and are negative.  PHYSICAL EXAMINATION: ECOG PERFORMANCE STATUS: 0 - Asymptomatic  Vitals:   07/08/22 0954  BP: (!) 172/92  Pulse: 85  Resp: 18  SpO2: 100%   Filed Weights   07/08/22 0954  Weight: 202 lb 9.6 oz (91.9 kg)    GENERAL:alert, no distress and comfortable NEURO: alert & oriented x 3 with fluent speech, no focal motor/sensory deficits  LABORATORY DATA:  I have reviewed the data as listed     Component Value Date/Time   NA 143 05/14/2022 1636   K 3.5 05/14/2022 1636   CL 103 05/14/2022 1636   CO2 25 05/14/2022  1636   GLUCOSE 96 05/14/2022 1636   GLUCOSE 100 (H) 03/12/2022 0909   BUN 18 05/14/2022 1636   CREATININE 0.86 05/14/2022 1636   CREATININE 0.64 06/13/2016 0928   CALCIUM 9.9 05/14/2022 1636   PROT 7.4 06/10/2021 1541   PROT 7.2 06/27/2020 1618   ALBUMIN 4.3 06/10/2021 1541   ALBUMIN 4.4 06/27/2020 1618   AST 13 06/10/2021 1541   ALT 11 06/10/2021 1541   ALKPHOS 70 06/10/2021 1541   BILITOT 0.2 06/10/2021 1541   BILITOT <0.2 06/27/2020 1618   GFRNONAA 105 06/27/2020 1618   GFRAA 121 06/27/2020 1618    No results found for: "SPEP", "UPEP"  Lab Results  Component Value Date   WBC 5.7 06/27/2022   NEUTROABS 11.5 (H) 03/10/2022   HGB 12.0 06/27/2022   HCT 36.7 06/27/2022   MCV 80.4 06/27/2022   PLT 63.0 Repeated and verified X2. (L) 06/27/2022       Chemistry      Component Value Date/Time   NA 143 05/14/2022 1636   K 3.5 05/14/2022 1636   CL 103 05/14/2022 1636   CO2 25 05/14/2022 1636   BUN 18 05/14/2022 1636   CREATININE 0.86 05/14/2022 1636   CREATININE 0.64 06/13/2016 0928      Component Value Date/Time   CALCIUM 9.9 05/14/2022 1636   ALKPHOS 70 06/10/2021 1541   AST 13 06/10/2021 1541   ALT 11 06/10/2021 1541   BILITOT 0.2 06/10/2021 1541   BILITOT <0.2 06/27/2020 1618

## 2022-07-08 NOTE — Assessment & Plan Note (Signed)
Her iron deficiency anemia has resolved today There is no indication for her to proceed with intravenous iron infusion I plan to repeat it again in 3 months for further follow-up

## 2022-07-22 ENCOUNTER — Encounter: Payer: Self-pay | Admitting: Family Medicine

## 2022-07-22 DIAGNOSIS — M25519 Pain in unspecified shoulder: Secondary | ICD-10-CM

## 2022-07-23 MED ORDER — METHOCARBAMOL 500 MG PO TABS: 500.0000 mg | ORAL_TABLET | Freq: Three times a day (TID) | ORAL | 0 refills | Status: DC | PRN

## 2022-08-18 ENCOUNTER — Other Ambulatory Visit: Payer: Self-pay | Admitting: Student

## 2022-08-21 MED ORDER — VALSARTAN-HYDROCHLOROTHIAZIDE 160-12.5 MG PO TABS: 2.0000 | ORAL_TABLET | Freq: Every day | ORAL | 3 refills | Status: DC

## 2022-08-27 ENCOUNTER — Other Ambulatory Visit: Payer: Self-pay | Admitting: Student

## 2022-09-13 ENCOUNTER — Other Ambulatory Visit: Payer: Self-pay | Admitting: Student

## 2022-09-15 ENCOUNTER — Telehealth: Payer: Self-pay | Admitting: Cardiology

## 2022-09-15 MED ORDER — AMLODIPINE BESYLATE 10 MG PO TABS: 5.0000 mg | ORAL_TABLET | Freq: Every day | ORAL | 1 refills | Status: DC

## 2022-09-15 NOTE — Telephone Encounter (Signed)
*  STAT* If patient is at the pharmacy, call can be transferred to refill team.   1. Which medications need to be refilled? (please list name of each medication and dose if known) Amlodipine 5 mg   2. Which pharmacy/location (including street and city if local pharmacy) is medication to be sent to? [id number]- [place], [place] - [address]. Do they need a 30 day or 90 day supply? 90  Has two pills left

## 2022-09-29 ENCOUNTER — Encounter: Payer: Self-pay | Admitting: Family Medicine

## 2022-10-06 ENCOUNTER — Inpatient Hospital Stay: Payer: BC Managed Care – PPO | Attending: Hematology and Oncology

## 2022-10-06 ENCOUNTER — Other Ambulatory Visit: Payer: Self-pay

## 2022-10-06 DIAGNOSIS — D509 Iron deficiency anemia, unspecified: Secondary | ICD-10-CM | POA: Diagnosis not present

## 2022-10-06 DIAGNOSIS — D696 Thrombocytopenia, unspecified: Secondary | ICD-10-CM | POA: Insufficient documentation

## 2022-10-06 LAB — CBC WITH DIFFERENTIAL/PLATELET
Abs Immature Granulocytes: 0.03 10*3/uL (ref 0.00–0.07)
Basophils Absolute: 0 10*3/uL (ref 0.0–0.1)
Basophils Relative: 0 %
Eosinophils Absolute: 0.1 10*3/uL (ref 0.0–0.5)
Eosinophils Relative: 2 %
HCT: 37.3 % (ref 36.0–46.0)
Hemoglobin: 12.3 g/dL (ref 12.0–15.0)
Immature Granulocytes: 0 %
Lymphocytes Relative: 31 %
Lymphs Abs: 2.1 10*3/uL (ref 0.7–4.0)
MCH: 28.3 pg (ref 26.0–34.0)
MCHC: 33 g/dL (ref 30.0–36.0)
MCV: 85.9 fL (ref 80.0–100.0)
Monocytes Absolute: 0.7 10*3/uL (ref 0.1–1.0)
Monocytes Relative: 10 %
Neutro Abs: 3.8 10*3/uL (ref 1.7–7.7)
Neutrophils Relative %: 57 %
Platelets: 97 10*3/uL — ABNORMAL LOW (ref 150–400)
RBC: 4.34 MIL/uL (ref 3.87–5.11)
RDW: 14.6 % (ref 11.5–15.5)
WBC: 6.8 10*3/uL (ref 4.0–10.5)
nRBC: 0 % (ref 0.0–0.2)

## 2022-10-06 LAB — IRON AND IRON BINDING CAPACITY (CC-WL,HP ONLY)
Iron: 49 ug/dL (ref 28–170)
Saturation Ratios: 12 % (ref 10.4–31.8)
TIBC: 420 ug/dL (ref 250–450)
UIBC: 371 ug/dL (ref 148–442)

## 2022-10-06 LAB — FERRITIN: Ferritin: 22 ng/mL (ref 11–307)

## 2022-10-09 ENCOUNTER — Ambulatory Visit: Payer: BC Managed Care – PPO | Admitting: Hematology and Oncology

## 2022-10-17 ENCOUNTER — Inpatient Hospital Stay: Payer: BC Managed Care – PPO | Attending: Hematology and Oncology | Admitting: Hematology and Oncology

## 2022-10-17 ENCOUNTER — Encounter: Payer: Self-pay | Admitting: Hematology and Oncology

## 2022-10-17 DIAGNOSIS — D696 Thrombocytopenia, unspecified: Secondary | ICD-10-CM

## 2022-10-17 DIAGNOSIS — D509 Iron deficiency anemia, unspecified: Secondary | ICD-10-CM

## 2022-10-17 NOTE — Assessment & Plan Note (Signed)
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

## 2022-10-17 NOTE — Assessment & Plan Note (Signed)
She is not anemic and her iron studies are stable I plan to recheck it again in 3 months for further follow-up

## 2022-10-17 NOTE — Progress Notes (Signed)
HEMATOLOGY-ONCOLOGY ELECTRONIC VISIT PROGRESS NOTE  Patient Care Team: [name], MD as PCP - General (Family Medicine) Stanford Breed Denice Bors, MD as PCP - Cardiology (Cardiology) Leo Grosser, Seymour Bars, MD (Inactive) as Consulting Physician (Obstetrics and Gynecology)  I connected with the patient via telephone conference and verified that I am speaking with the correct person using two identifiers. The patient's location is at home and I am providing care from the [facility] I discussed the limitations, risks, security and privacy concerns of performing an evaluation and management service by e-visits and the availability of in person appointments.  I also discussed with the patient that there may be a patient responsible charge related to this service. The patient expressed understanding and agreed to proceed.   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    No orders of the defined types were placed in this encounter.   INTERVAL HISTORY: Please see below for problem oriented charting. The purpose of today's discussion is to review test results I called the patient several times before I was able to get hold of her She complained of excessive fatigue Denies recent bleeding  SUMMARY OF HEMATOLOGIC HISTORY:  She was found  to have abnormal CBC from recent blood draw  Her recent CBC from 06/10/2021 showed low platelet count of 105 Most recently, on January 06, 2022, her platelet count was 63,000 She denies recent bruising/bleeding, such as spontaneous epistaxis, hematuria, melena or hematochezia She has excessive menorrhagia since July of 2022 which she attributed to excessive menstruation after COVID-19 vaccination on July 31 However, on review of her blood work dated back to June 2022, she was noted to have mild thrombocytopenia At the end of May, the patient had severe bronchitis/respiratory infection which I think precipitated possible acute ITP event She was even recently evaluated by gynecologist and was told she had 3 uterine fibroids Hysterectomy was discussed The patient denies history of liver disease, exposure to heparin, history of cardiac murmur/prior cardiovascular surgery or recent new medications She denies prior blood or platelet transfusions The patient started taking more iron rich diet such as spinach and liver She has been taking oral iron supplement daily at night for several years She has severe chronic nausea Of note, she is taking potassium replacement therapy due to hypokalemia related to her diuretic therapy Several years ago, she was diagnosed with congestive heart failure and she takes aspirin on a regular basis She also take NSAIDs regularly She was started on Megace for several months without success of stopping her menorrhagia and recently was placed on the birth control She has chronic intermittent abdominal pain She menarche at the age of 15 and has no children She usually has regular menstruation until after July, she has been having frequent bleeding that really stops.  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  REVIEW OF SYSTEMS:   Constitutional: Denies fevers, chills or abnormal weight loss Eyes: Denies blurriness of vision Ears, nose, mouth, throat, and face: Denies mucositis or sore throat Respiratory: Denies cough, dyspnea or wheezes Cardiovascular: Denies palpitation, chest discomfort Gastrointestinal:  Denies nausea, heartburn or change in bowel habits Skin: Denies abnormal skin rashes Lymphatics: Denies new lymphadenopathy or easy bruising Neurological:Denies numbness, tingling or new weaknesses Behavioral/Psych: Mood is stable, no new changes  Extremities: No lower extremity edema All other systems were reviewed with the patient and are negative.  I have reviewed the past medical history, past surgical history, social history and family history with the patient and they are unchanged from previous note.  ALLERGIES:  is allergic to amlodipine, bystolic [nebivolol hcl], and lisinopril.  MEDICATIONS:  Current Outpatient Medications  Medication Sig Dispense Refill   albuterol (VENTOLIN HFA) 108 (90 Base) MCG/ACT inhaler INHALE 1-2 PUFFS BY MOUTH EVERY 6 HOURS AS NEEDED FOR WHEEZE OR SHORTNESS OF BREATH 1 each 0   amLODipine (NORVASC) 10 MG tablet Take 0.5 tablets (5 mg total) by mouth daily. 45 tablet 1   Ascorbic Acid (VITAMIN C) 100 MG tablet Take 100 mg by mouth daily.     aspirin EC 81 MG tablet Take 81 mg by mouth daily. Swallow whole.     benzonatate (TESSALON) 100 MG capsule Take 1 capsule (100 mg total) by mouth 3 (three) times daily as needed. 30 capsule 0   ferrous sulfate 325 (65 FE) MG tablet TAKE 1 TABLET BY MOUTH 3 TIMES DAILY WITH MEALS. 270 tablet 1   labetalol (NORMODYNE) 200 MG tablet TAKE 1/2 TAB (100 MG) BY MOUTH EVERY MORNING AND 1 TAB EVERY EVENING. 135 tablet  2   Loratadine (CLARITIN PO) Take by mouth daily.     Melatonin 5 MG CHEW Chew 5 mg by mouth.     methocarbamol (ROBAXIN) 500 MG tablet Take 1 tablet (500 mg total) by mouth every 8 (eight) hours as needed for muscle spasms. 15 tablet 0   Multiple Vitamins-Minerals (AIRBORNE PO) Take by mouth.     mupirocin ointment (BACTROBAN) 2 % Apply 1 application topically 2 (two) times daily. 22 g 0   PARoxetine (PAXIL-CR) 25 MG 24 hr tablet Take 1 tablet (25 mg total) by mouth daily. 90 tablet 1   potassium chloride SA (KLOR-CON) 20 MEQ tablet Take 1 tablet (20 mEq total) by mouth 2 (two) times daily. Pt states tablet is too big and she cuts tablet in in 1/4 180 tablet 3   valsartan-hydrochlorothiazide (DIOVAN HCT) 160-12.5 MG tablet Take 2 tablets by mouth daily. 60 tablet 3   No current facility-administered medications for this visit.    PHYSICAL EXAMINATION: ECOG PERFORMANCE STATUS: 1 - Symptomatic but completely ambulatory  LABORATORY DATA:  I have reviewed the data as listed    Latest Ref Rng & Units 05/14/2022    4:36 PM 03/12/2022    9:09 AM 06/10/2021    3:41 PM  CMP  Glucose 70 - 99 mg/dL 96  100  104   BUN 6 - 24 mg/dL '18  10  18   '$ Creatinine 0.57 -  1.00 mg/dL 0.86  0.70  0.81   Sodium 134 - 144 mmol/L 143  144  140   Potassium 3.5 - 5.2 mmol/L 3.5  3.4  3.4   Chloride 96 - 106 mmol/L 103  106  102   CO2 20 - 29 mmol/L 25   28   Calcium 8.7 - 10.2 mg/dL 9.9   9.5   Total Protein 6.0 - 8.3 g/dL   7.4   Total Bilirubin 0.2 - 1.2 mg/dL   0.2   Alkaline Phos 39 - 117 U/L   70   AST 0 - 37 U/L   13   ALT 0 - 35 U/L   11     Lab Results  Component Value Date   WBC 6.8 10/06/2022   HGB 12.3 10/06/2022   HCT 37.3 10/06/2022   MCV 85.9 10/06/2022   PLT 97 (L) 10/06/2022   NEUTROABS 3.8 10/06/2022     I discussed the assessment and treatment plan with the patient. The patient was provided an opportunity to ask questions and all were answered. The patient agreed with the plan and  demonstrated an understanding of the instructions. The patient was advised to call back or seek an in-person evaluation if the symptoms worsen or if the condition fails to improve as anticipated.    I spent 20 minutes for the appointment reviewing test results, discuss management and coordination of care.  [name], MD 10/17/2022 1:20 PM

## 2022-10-21 ENCOUNTER — Telehealth: Payer: Self-pay | Admitting: Hematology and Oncology
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
## 2022-10-21 NOTE — Telephone Encounter (Signed)
Scheduled appointment per 11/3 los. Patient is aware.

## 2022-10-25 ENCOUNTER — Encounter (HOSPITAL_COMMUNITY): Payer: Self-pay | Admitting: *Deleted

## 2022-10-25 ENCOUNTER — Ambulatory Visit (HOSPITAL_COMMUNITY)
Admission: EM | Admit: 2022-10-25 | Discharge: 2022-10-25 | Disposition: A | Discharge status: Home / Self Care | Payer: BC Managed Care – PPO | Attending: Emergency Medicine | Admitting: Emergency Medicine

## 2022-10-25 DIAGNOSIS — J069 Acute upper respiratory infection, unspecified: Secondary | ICD-10-CM | POA: Diagnosis not present

## 2022-10-25 DIAGNOSIS — L299 Pruritus, unspecified: Secondary | ICD-10-CM

## 2022-10-25 MED ORDER — MUPIROCIN 2 % EX OINT: 1.0000 | TOPICAL_OINTMENT | Freq: Two times a day (BID) | CUTANEOUS | 0 refills | Status: DC

## 2022-10-25 MED ORDER — CEPHALEXIN 500 MG PO CAPS: 500.0000 mg | ORAL_CAPSULE | Freq: Two times a day (BID) | ORAL | 0 refills | Status: AC
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
## 2022-10-25 NOTE — ED Triage Notes (Signed)
Pt states she has a sore throat x 2-3 weeks ago she took elderberry and she thought it was better but its not. She does not want a strep test she said she gags so much it doesn't work .  She did a virtual visit this morning and was given a rx for an albuterol MDI. 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    She has an bacteria infection on her right foot she was using antibiotic which she didn't finish she was saving the rest in case the infection got really bad She also has mupirocin as well but she is out of that rx. [facility] has seen [facility] for this.

## 2022-10-25 NOTE — Discharge Instructions (Signed)
For your throat -On exam your throat is not red and there are no Lyzbeth Genrich patches, low suspicion for strep which is a bacteria -As your symptoms have been going on for 2 to 3 weeks the antibiotic given for your foot will be helpful in killing bacteria to the airways as well which may be prolonging your symptoms -You may continue the elderberry and vitamin C if helpful -You may attempt salt water gargles, throat lozenges, warm liquids and teaspoons of honey for additional comfort -May also take over-the-counter Mucinex which will help thin out secretions and draining them from your throat   For your foot -There is cracking to the skin and you endorse that your foot is itching -As you have seen improvement with topical cream in the past, Bactroban has been prescribed for you to use twice daily -As you did not complete medication recommended by your podiatrist, begin Keflex every morning and every evening for 7 days, please complete all medicine -You have been given information to to local foot doctors, please call and schedule an appointment for reevaluation if your symptoms continue to persist

## 2022-10-31 ENCOUNTER — Telehealth: Payer: Self-pay | Admitting: Cardiology

## 2022-10-31 NOTE — Telephone Encounter (Signed)
Spoke with pt regarding her upcoming visit on Tuesday 11/21. Pt is unable to miss work around the holiday and would like to switch her appointment to a virtual appointment. Changed pt's appointment to video visit. Will forward to Dr. Stanford Breed and nurse to make them aware. Pt verbalizes understanding.

## 2022-10-31 NOTE — Telephone Encounter (Signed)
Patient want to speak to the nurse about her appt on Monday. Please advise

## 2022-11-04 ENCOUNTER — Encounter: Payer: Self-pay | Admitting: Cardiology

## 2022-11-04 ENCOUNTER — Ambulatory Visit: Payer: BC Managed Care – PPO | Attending: Cardiology | Admitting: Cardiology

## 2022-11-04 VITALS — Ht 69.0 in | Wt 209.0 lb

## 2022-11-04 DIAGNOSIS — E7800 Pure hypercholesterolemia, unspecified: Secondary | ICD-10-CM

## 2022-11-04 DIAGNOSIS — I10 Essential (primary) hypertension: Secondary | ICD-10-CM | POA: Diagnosis not present

## 2022-11-04 DIAGNOSIS — R072 Precordial pain: Secondary | ICD-10-CM | POA: Diagnosis not present

## 2022-11-04 NOTE — Patient Instructions (Signed)
  Lab Work:  Your physician recommends that you return for lab work FASTING  If you have labs (blood work) drawn today and your tests are completely normal, you will receive your results only by: [place] (if you have [place]) OR A paper copy in the mail If you have any lab test that is abnormal or we need to change your treatment, we will call you to review the results.   Follow-Up: At [facility], you and your health needs are our priority.  As part of our continuing mission to provide you with exceptional heart care, we have created designated Provider Care Teams.  These Care Teams include your primary Cardiologist (physician) and Advanced Practice Providers (APPs -  Physician Assistants and Nurse Practitioners) who all work together to provide you with the care you need, when you need it.  We recommend signing up for the patient portal called "MyChart".  Sign up information is provided on this After Visit Summary.  MyChart is used to connect with patients for Virtual Visits (Telemedicine).  Patients are able to view lab/test results, encounter notes, upcoming appointments, etc.  Non-urgent messages can be sent to your provider as well.   To learn more about what you can do with MyChart, go to [email or web address].    Your next appointment:   6 month(s)  The format for your next appointment:   In Person  Provider:   Sande Rives, PA-C, Almyra Deforest, PA-C, or Diona Browner, NP    Then, Kirk Ruths, MD will plan to see you again in 12 month(s).

## 2022-11-13 ENCOUNTER — Encounter: Payer: Self-pay | Admitting: Podiatry

## 2022-11-13 ENCOUNTER — Ambulatory Visit: Payer: BC Managed Care – PPO | Admitting: Podiatry

## 2022-11-13 DIAGNOSIS — B353 Tinea pedis: Secondary | ICD-10-CM

## 2022-11-13 MED ORDER — TERBINAFINE HCL 250 MG PO TABS: 250.0000 mg | ORAL_TABLET | Freq: Every day | ORAL | 0 refills | Status: DC

## 2022-11-13 MED ORDER — CLOTRIMAZOLE-BETAMETHASONE 1-0.05 % EX CREA: 1.0000 | TOPICAL_CREAM | Freq: Two times a day (BID) | CUTANEOUS | 3 refills | Status: DC

## 2022-11-13 NOTE — Progress Notes (Signed)
  Subjective:  Patient ID: Leslie [name], female    DOB: 09-11-1976,  MRN: [id number]  Chief Complaint  Patient presents with   Tinea Pedis    She was seen in 2021 for the same thing, she has run out of the meds we gave her and needs refills    46 y.o. female presents with the above complaint. History confirmed with patient.  She was doing very well but has returned recently  Objective:  Physical Exam: warm, good capillary refill, no trophic changes or ulcerative lesions, normal DP and PT pulses and normal sensory exam.  Right Foot: Dry scaling pruritic rash in moccasin distribution consistent with tinea pedis   Assessment:   1. Tinea pedis of right foot      Plan:  Patient was evaluated and treated and all questions answered.  Discussed the etiology and treatment options for tinea pedis.  Discussed topical and oral treatment.  Recommended treatment with Lotrisone and terbinafine p.o.  This was sent to the patient's pharmacy.    Also discussed appropriate foot hygiene, use of antifungal spray such as Tinactin in shoes, as well as cleaning her foot surfaces such as showers and bathroom floors with bleach.   Return if symptoms worsen or fail to improve.

## 2022-12-02 ENCOUNTER — Encounter: Payer: Self-pay | Admitting: *Deleted

## 2022-12-16 ENCOUNTER — Ambulatory Visit: Payer: BC Managed Care – PPO | Admitting: Family Medicine

## 2022-12-16 ENCOUNTER — Encounter: Payer: Self-pay | Admitting: Family Medicine

## 2022-12-16 ENCOUNTER — Telehealth: Payer: Self-pay | Admitting: Family Medicine

## 2022-12-16 VITALS — BP 176/86 | HR 85 | Temp 96.9°F | Ht 69.0 in | Wt 212.0 lb

## 2022-12-16 DIAGNOSIS — Z818 Family history of other mental and behavioral disorders: Secondary | ICD-10-CM

## 2022-12-16 DIAGNOSIS — I10 Essential (primary) hypertension: Secondary | ICD-10-CM | POA: Diagnosis not present

## 2022-12-16 NOTE — Telephone Encounter (Signed)
Pt is needing a doctor's note for today 12/16/22. She is headed back to work now and would like this put in her mychart.

## 2022-12-16 NOTE — Progress Notes (Signed)
Established Patient Office Visit   Subjective:  Patient ID: Leslie [name], female    DOB: 1976-11-04  Age: 47 y.o. MRN: [id number]  Chief Complaint  Patient presents with   Acute Visit    Chest pain x 2 months  , feet swelling x 3 weeks     HPI Encounter Diagnoses  Name Primary?   Uncontrolled hypertension Yes   Family history of stress    For follow-up of stress at home caring for her father..  She continues her Paxil and wants to continue it.  It helps her to stay calm.  She is seeing cardiology for her hypertension.  She is compliant with her medications.  Blood pressure at home is usually lower.  She is seeing Dr. 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  She used her inhaler and this seemed to help.  He had wanted to see her in the office.  She has experienced no further chest pain since that episode back in November.   Review of Systems  Constitutional: Negative.  Negative for diaphoresis.  HENT: Negative.    Eyes:  Negative for blurred vision, discharge and redness.  Respiratory: Negative.  Negative for shortness of breath.   Cardiovascular: Negative.  Negative for chest pain.  Gastrointestinal:  Negative for abdominal pain and nausea.  Genitourinary: Negative.   Musculoskeletal: Negative.  Negative for myalgias.  Skin:  Negative for rash.  Neurological:  Negative for tingling, loss of consciousness and weakness.  Endo/Heme/Allergies:  Negative for polydipsia.     Current Outpatient Medications:    albuterol (VENTOLIN HFA) 108 (90 Base) MCG/ACT inhaler, INHALE 1-2 PUFFS BY MOUTH EVERY 6 HOURS AS NEEDED FOR WHEEZE OR SHORTNESS OF BREATH, Disp: 1 each, Rfl: 0   amLODipine (NORVASC) 10 MG tablet, Take 0.5 tablets (5 mg total) by mouth daily., Disp: 45 tablet, Rfl: 1   Ascorbic Acid (VITAMIN C) 100 MG tablet, Take 100 mg by mouth daily., Disp: , Rfl:    aspirin EC 81 MG tablet, Take 81 mg by  mouth daily. Swallow whole., Disp: , Rfl:    benzonatate (TESSALON) 100 MG capsule, Take 1 capsule (100 mg total) by mouth 3 (three) times daily as needed., Disp: 30 capsule, Rfl: 0   clotrimazole-betamethasone (LOTRISONE) cream, Apply 1 Application topically 2 (two) times daily., Disp: 30 g, Rfl: 3   ferrous sulfate 325 (65 FE) MG tablet, TAKE 1 TABLET BY MOUTH 3 TIMES DAILY WITH MEALS., Disp: 270 tablet, Rfl: 1   FLOVENT HFA 110 MCG/ACT inhaler, Inhale 2 puffs into the lungs 2 (two) times daily., Disp: , Rfl:    labetalol (NORMODYNE) 200 MG tablet, TAKE 1/2 TAB (100 MG) BY MOUTH EVERY MORNING AND 1 TAB EVERY EVENING., Disp: 135 tablet, Rfl: 2   Loratadine (CLARITIN PO), Take by mouth daily., Disp: , Rfl:    Melatonin 5 MG CHEW, Chew 5 mg by mouth., Disp: , Rfl:    methocarbamol (ROBAXIN) 500 MG tablet, Take 1 tablet (500 mg total) by mouth every 8 (eight) hours as needed for muscle spasms., Disp: 15 tablet, Rfl: 0   Multiple Vitamins-Minerals (AIRBORNE PO), Take by mouth., Disp: , Rfl:    mupirocin ointment (BACTROBAN) 2 %, Apply 1 Application topically 2 (two) times daily., Disp: 22 g, Rfl: 0   PARoxetine (PAXIL-CR) 25 MG 24 hr tablet, Take 1 tablet (25 mg total) by mouth daily., Disp: 90 tablet, Rfl: 1  potassium chloride SA (KLOR-CON) 20 MEQ tablet, Take 1 tablet (20 mEq total) by mouth 2 (two) times daily. Pt states tablet is too big and she cuts tablet in in 1/4, Disp: 180 tablet, Rfl: 3   terbinafine (LAMISIL) 250 MG tablet, Take 1 tablet (250 mg total) by mouth daily., Disp: 30 tablet, Rfl: 0   valsartan-hydrochlorothiazide (DIOVAN HCT) 160-12.5 MG tablet, Take 2 tablets by mouth daily., Disp: 60 tablet, Rfl: 3   Objective:     BP (!) 176/86 (BP Location: Left Arm, Patient Position: Sitting, Cuff Size: Normal)   Pulse 85   Temp (!) 96.9 F (36.1 C) (Temporal)   Ht '5\' 9"'$  (1.753 m)   Wt 212 lb (96.2 kg)   SpO2 97%   BMI 31.31 kg/m    Physical Exam Constitutional:      General:  She is not in acute distress.    Appearance: Normal appearance. She is not ill-appearing, toxic-appearing or diaphoretic.  HENT:     Head: Normocephalic and atraumatic.     Right Ear: External ear normal.     Left Ear: External ear normal.  Eyes:     General: No scleral icterus.       Right eye: No discharge.        Left eye: No discharge.     Extraocular Movements: Extraocular movements intact.     Conjunctiva/sclera: Conjunctivae normal.  Cardiovascular:     Rate and Rhythm: Normal rate and regular rhythm.  Pulmonary:     Effort: Pulmonary effort is normal. No respiratory distress.     Breath sounds: No wheezing, rhonchi or rales.  Musculoskeletal:     Right lower leg: No edema.     Left lower leg: No edema.  Skin:    General: Skin is warm and dry.  Neurological:     Mental Status: She is alert and oriented to person, place, and time.  Psychiatric:        Mood and Affect: Mood normal.        Behavior: Behavior normal.      No results found for any visits on 12/16/22.    The ASCVD Risk score (Arnett DK, et al., 2019) failed to calculate for the following reasons:   Cannot find a previous HDL lab   Cannot find a previous total cholesterol lab    Assessment & Plan:   Uncontrolled hypertension -     Ambulatory referral to Cardiology  Family history of stress    Return in about 3 months (around 03/17/2023).  Continue Paxil.  Follow-up with cardiology for face-to-face visit.  Information was given on managing hypertension and mindfulness based stress reduction.  [name], MD

## 2022-12-17 ENCOUNTER — Encounter: Payer: Self-pay | Admitting: Family Medicine

## 2022-12-17 NOTE — Telephone Encounter (Signed)
Note written and sent via patients Mychart per patients request

## 2022-12-19 ENCOUNTER — Other Ambulatory Visit: Payer: Self-pay | Admitting: Cardiology

## 2022-12-23 DIAGNOSIS — E7800 Pure hypercholesterolemia, unspecified: Secondary | ICD-10-CM | POA: Diagnosis not present

## 2022-12-23 DIAGNOSIS — I10 Essential (primary) hypertension: Secondary | ICD-10-CM | POA: Diagnosis not present

## 2022-12-24 ENCOUNTER — Telehealth: Payer: Self-pay | Admitting: *Deleted

## 2022-12-24 DIAGNOSIS — E876 Hypokalemia: Secondary | ICD-10-CM

## 2022-12-24 LAB — COMPREHENSIVE METABOLIC PANEL
ALT: 10 IU/L (ref 0–32)
AST: 13 IU/L (ref 0–40)
Albumin/Globulin Ratio: 1.8 (ref 1.2–2.2)
Albumin: 4.6 g/dL (ref 3.9–4.9)
Alkaline Phosphatase: 78 IU/L (ref 44–121)
BUN/Creatinine Ratio: 17 (ref 9–23)
BUN: 14 mg/dL (ref 6–24)
Bilirubin Total: 0.3 mg/dL (ref 0.0–1.2)
CO2: 25 mmol/L (ref 20–29)
Calcium: 9.4 mg/dL (ref 8.7–10.2)
Chloride: 99 mmol/L (ref 96–106)
Creatinine, Ser: 0.82 mg/dL (ref 0.57–1.00)
Globulin, Total: 2.6 g/dL (ref 1.5–4.5)
Glucose: 108 mg/dL — ABNORMAL HIGH (ref 70–99)
Potassium: 3.2 mmol/L — ABNORMAL LOW (ref 3.5–5.2)
Sodium: 140 mmol/L (ref 134–144)
Total Protein: 7.2 g/dL (ref 6.0–8.5)
eGFR: 89 mL/min/{1.73_m2} (ref >59)

## 2022-12-24 LAB — LIPID PANEL
Chol/HDL Ratio: 3.9 ratio (ref 0.0–4.4)
Cholesterol, Total: 213 mg/dL — ABNORMAL HIGH (ref 100–199)
HDL: 55 mg/dL (ref >39)
LDL Chol Calc (NIH): 138 mg/dL — ABNORMAL HIGH (ref 0–99)
Triglycerides: 110 mg/dL (ref 0–149)
VLDL Cholesterol Cal: 20 mg/dL (ref 5–40)

## 2022-12-24 NOTE — Telephone Encounter (Signed)
Spoke with pt, she reports she takes potassium whenever. Explained the importance of potassium related to heart rhythm and the need to take 20 meq twice daily. Patient voiced understanding. Lab orders mailed to the pt

## 2022-12-24 NOTE — Telephone Encounter (Signed)
-----   Message from Lelon Perla, MD sent at 12/24/2022  6:59 AM EST ----- Make sure pt taking kdur 20 meq BID; if she is increase to 40 meq BID with bmet one week Kirk [facility]s

## 2022-12-29 ENCOUNTER — Ambulatory Visit: Payer: BC Managed Care – PPO | Admitting: Family Medicine
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
## 2023-01-08 ENCOUNTER — Encounter: Payer: Self-pay | Admitting: *Deleted

## 2023-01-13 ENCOUNTER — Encounter: Payer: Self-pay | Admitting: Student

## 2023-01-13 ENCOUNTER — Ambulatory Visit: Payer: BC Managed Care – PPO | Attending: Student | Admitting: Student

## 2023-01-13 VITALS — BP 148/98 | HR 84 | Ht 70.0 in | Wt 217.0 lb

## 2023-01-13 DIAGNOSIS — Z01812 Encounter for preprocedural laboratory examination: Secondary | ICD-10-CM

## 2023-01-13 DIAGNOSIS — E785 Hyperlipidemia, unspecified: Secondary | ICD-10-CM | POA: Diagnosis not present

## 2023-01-13 DIAGNOSIS — Z87898 Personal history of other specified conditions: Secondary | ICD-10-CM

## 2023-01-13 DIAGNOSIS — I5189 Other ill-defined heart diseases: Secondary | ICD-10-CM | POA: Diagnosis not present

## 2023-01-13 DIAGNOSIS — I10 Essential (primary) hypertension: Secondary | ICD-10-CM | POA: Diagnosis not present

## 2023-01-13 DIAGNOSIS — E876 Hypokalemia: Secondary | ICD-10-CM | POA: Diagnosis not present

## 2023-01-13 DIAGNOSIS — R400 Somnolence: Secondary | ICD-10-CM

## 2023-01-13 DIAGNOSIS — R0683 Snoring: Secondary | ICD-10-CM

## 2023-01-13 NOTE — Patient Instructions (Addendum)
Medication Instructions:   TAKE Labetalol as directed- half tablet (100 mg) by mouth in the mornings and 1 tablet (200 mg) by mouth in the evenings   *If you need a refill on your cardiac medications before your next appointment, please call your pharmacy*  Lab Work: Your physician recommends that you return for lab work TODAY:  BMP-Previously ordered  HCG If you have labs (blood work) drawn today and your tests are completely normal, you will receive your results only by: [place] (if you have MyChart) OR A paper copy in the mail If you have any lab test that is abnormal or we need to change your treatment, we will call you to review the results.  Testing/Procedures: Sande Rives, PA-C has ordered a CT coronary calcium score.   Test locations:  [place]   This is $99 out of pocket.   Coronary CalciumScan A coronary calcium scan is an imaging test used to look for deposits of calcium and other fatty materials (plaques) in the inner lining of the blood vessels of the heart (coronary arteries). These deposits of calcium and plaques can partly clog and narrow the coronary arteries without producing any symptoms or warning signs. This puts a person at risk for a heart attack. This test can detect these deposits before symptoms develop. Tell a health care provider about: Any allergies you have. All medicines you are taking, including vitamins, herbs, eye drops, creams, and over-the-counter medicines. Any problems you or family members have had with anesthetic medicines. Any blood disorders you have. Any surgeries you have had. Any medical conditions you have. Whether you are pregnant or may be pregnant. What are the risks? Generally, this is a safe procedure. However, problems may occur, including: Harm to a pregnant woman and her unborn baby. This test involves the use of radiation. Radiation exposure can be dangerous to a pregnant woman and  her unborn baby. If you are pregnant, you generally should not have this procedure done. Slight increase in the risk of cancer. This is because of the radiation involved in the test. What happens before the procedure? No preparation is needed for this procedure. What happens during the procedure? You will undress and remove any jewelry around your neck or chest. You will put on a hospital gown. Sticky electrodes will be placed on your chest. The electrodes will be connected to an electrocardiogram (ECG) machine to record a tracing of the electrical activity of your heart. A CT scanner will take pictures of your heart. During this time, you will be asked to lie still and hold your breath for 2-3 seconds while a picture of your heart is being taken. The procedure may vary among health care providers and hospitals. What happens after the procedure? You can get dressed. You can return to your normal activities. It is up to you to get the results of your test. Ask your health care provider, or the department that is doing the test, when your results will be ready. Summary A coronary calcium scan is an imaging test used to look for deposits of calcium and other fatty materials (plaques) in the inner lining of the blood vessels of the heart (coronary arteries). Generally, this is a safe procedure. Tell your health care provider if you are pregnant or may be pregnant. No preparation is needed for this procedure. A CT scanner will take pictures of your heart. You can return to your normal activities after the scan is done. This  information is not intended to replace advice given to you by your health care provider. Make sure you discuss any questions you have with your health care provider. Document Released: 05/29/2008 Document Revised: 10/20/2016 Document Reviewed: 10/20/2016 Elsevier Interactive Patient Education  2017 [place]?  Is a FDA cleared portable home sleep study test that  uses a watch and 3 points of contact to monitor 7 different channels, including your heart rate, oxygen saturations, body position, snoring, and chest motion.  The study is easy to use from the comfort of your own home and accurately detect sleep apnea.  Before bed, you attach the chest sensor, attached the sleep apnea bracelet to your nondominant hand, and attach the finger probe.  After the study, the raw data is downloaded from the watch and scored for apnea events.   For more information: [email or web address]  Patient Testing Instructions:  Do not put battery into the device until bedtime when you are ready to begin the test. Please call the support number if you need assistance after following the instructions below: 24 hour support line- [phone] or [facility] support at [phone] (option 2)  Download the [employer] AmericanWatchPAT One" app through the google play store or App Store  Be sure to turn on or enable access to bluetooth in [place] on your smartphone/ device  Make sure no other bluetooth devices are on and within the vicinity of your smartphone/ device and WatchPAT watch during testing.  Make sure to leave your smart phone/ device plugged in and charging all night.  When ready for bed:  Follow the instructions step by step in the WatchPAT One App to activate the testing device. For additional instructions, including video instruction, visit the WatchPAT One video on Youtube. You can search for [place] One within Youtube (video is 4 minutes and 18 seconds) or enter: https://youtube/watch?v=BCce_vbiwxE Please note: You will be prompted to enter a Pin to connect via bluetooth when starting the test. The PIN will be assigned to you when you receive the test.  The device is disposable, but it recommended that you retain the device until you receive a call letting you know the study has been received and the results have been interpreted.  We will let you know if the study  did not transmit to [place] properly after the test is completed. You do not need to call us to confirm the receipt of the test.  Please complete the test within 48 hours of receiving PIN.   Frequently Asked Questions:  What is Watch [place] one?  A single use fully disposable home sleep apnea testing device and will not need to be returned after completion.  What are the requirements to use WatchPAT one?  The be able to have a successful watchpat one sleep study, you should have your Watch pat one device, your smart phone, watch pat one app, your PIN number and Internet access What type of phone do I need?  You should have a smart phone that uses Android 5.1 and above or any Iphone with IOS 10 and above How can I download the WatchPAT one app?  Based on your device type search for WatchPAT one app either in google play for android devices or APP store for Iphone's Where will I get my PIN for the study?  Your PIN will be provided by your physician's office. It is used for authentication and if you lose/forget your PIN, please reach out to your providers office.  I do not have  Internet at home. Can I do WatchPAT one study?  WatchPAT One needs Internet connection throughout the night to be able to transmit the sleep data. You can use your home/local internet or your cellular's data package. However, it is always recommended to use home/local Internet. It is estimated that between 20MB-30MB will be used with each study.However, the application will be looking for 80MB space in the phone to start the study.  What happens if I lose internet or bluetooth connection?  During the internet disconnection, your phone will not be able to transmit the sleep data. All the data, will be stored in your phone. As soon as the internet connection is back on, the phone will being sending the sleep data. During the bluetooth disconnection, WatchPAT one will not be able to to send the sleep data to your phone. Data will be kept  in the [facility] one until two devices have bluetooth connection back on. As soon as the connection is back on, WatchPAT one will send the sleep data to the phone.  How long do I need to wear the WatchPAT one?  After you start the study, you should wear the device at least 6 hours.  How far should I keep my phone from the device?  During the night, your phone should be within 15 feet.  What happens if I leave the room for restroom or other reasons?  Leaving the room for any reason will not cause any problem. As soon as your get back to the room, both devices will reconnect and will continue to send the sleep data. Can I use my phone during the sleep study?  Yes, you can use your phone as usual during the study. But it is recommended to put your watchpat one on when you are ready to go to bed.  How will I get my study results?  A soon as you completed your study, your sleep data will be sent to the provider. They will then share the results with you when they are ready.    Follow-Up: At [facility], you and your health needs are our priority.  As part of our continuing mission to provide you with exceptional heart care, we have created designated Provider Care Teams.  These Care Teams include your primary Cardiologist (physician) and Advanced Practice Providers (APPs -  Physician Assistants and Nurse Practitioners) who all work together to provide you with the care you need, when you need it.   Your next appointment:   4-6 month(s)  Provider:   Kirk Ruths, MD  or Sande Rives, PA-C        Other Instructions MONITOR blood pressure and heart rate at home daily for 2 weeks and call our office and/or send readings via [place].

## 2023-01-14 LAB — BASIC METABOLIC PANEL
BUN/Creatinine Ratio: 28 — ABNORMAL HIGH (ref 9–23)
BUN: 18 mg/dL (ref 6–24)
CO2: 20 mmol/L (ref 20–29)
Calcium: 9.4 mg/dL (ref 8.7–10.2)
Chloride: 105 mmol/L (ref 96–106)
Creatinine, Ser: 0.65 mg/dL (ref 0.57–1.00)
Glucose: 96 mg/dL (ref 70–99)
Potassium: 3.5 mmol/L (ref 3.5–5.2)
Sodium: 143 mmol/L (ref 134–144)
eGFR: 109 mL/min/{1.73_m2} (ref >59)

## 2023-01-15 LAB — HCG, SERUM, QUALITATIVE: hCG,Beta Subunit,Qual,Serum: NEGATIVE m[IU]/mL (ref <6)

## 2023-01-19 ENCOUNTER — Inpatient Hospital Stay: Payer: BC Managed Care – PPO

## 2023-01-22 ENCOUNTER — Telehealth: Payer: BC Managed Care – PPO | Admitting: Hematology and Oncology

## 2023-01-23 ENCOUNTER — Telehealth: Payer: Self-pay | Admitting: Cardiology

## 2023-01-23 ENCOUNTER — Telehealth: Payer: Self-pay | Admitting: *Deleted

## 2023-01-23 NOTE — Telephone Encounter (Signed)
Patient was calling in for the code for the sleep study. Please advise

## 2023-01-23 NOTE — Telephone Encounter (Signed)
Called pt. Pt made aware she should prob call the [facility] to get the pin. Number given to pt. Pt advised to send a MyChart message if she needs to reach [place] again before 5pm. She verbalized understanding, no further questions at this time.

## 2023-01-23 NOTE — Telephone Encounter (Signed)
Prior Authorization for [employer] sent to [employer] via [employer]. Auth # [id number]. Valid dates 01/23/23 to 03/23/23. Secure chat sent to Stacy ok to activate device.

## 2023-01-26 ENCOUNTER — Encounter (INDEPENDENT_AMBULATORY_CARE_PROVIDER_SITE_OTHER): Payer: BC Managed Care – PPO | Admitting: Cardiology

## 2023-01-26 DIAGNOSIS — G4733 Obstructive sleep apnea (adult) (pediatric): Secondary | ICD-10-CM

## 2023-01-28 ENCOUNTER — Encounter: Payer: Self-pay | Admitting: Cardiology

## 2023-01-28 ENCOUNTER — Ambulatory Visit (HOSPITAL_BASED_OUTPATIENT_CLINIC_OR_DEPARTMENT_OTHER)
Admission: RE | Admit: 2023-01-28 | Discharge: 2023-01-28 | Disposition: A | Discharge status: Home / Self Care | Payer: BC Managed Care – PPO | Source: Ambulatory Visit | Attending: Student | Admitting: Student

## 2023-01-28 ENCOUNTER — Telehealth: Payer: Self-pay

## 2023-01-28 DIAGNOSIS — E785 Hyperlipidemia, unspecified: Secondary | ICD-10-CM | POA: Insufficient documentation

## 2023-01-28 NOTE — Telephone Encounter (Signed)
PATIENT HAS COMPLETED THE SLEEP STUDY USING THE [facility] DEVICE

## 2023-01-30 ENCOUNTER — Other Ambulatory Visit: Payer: Self-pay

## 2023-01-30 ENCOUNTER — Telehealth: Payer: Self-pay

## 2023-01-30 ENCOUNTER — Other Ambulatory Visit (HOSPITAL_COMMUNITY): Payer: Self-pay

## 2023-01-30 DIAGNOSIS — E785 Hyperlipidemia, unspecified: Secondary | ICD-10-CM

## 2023-01-30 DIAGNOSIS — Z79899 Other long term (current) drug therapy: Secondary | ICD-10-CM

## 2023-01-30 MED ORDER — EZETIMIBE 10 MG PO TABS: 10.0000 mg | ORAL_TABLET | Freq: Every day | ORAL | 3 refills | Status: DC

## 2023-01-30 NOTE — Telephone Encounter (Signed)
Pharmacy Patient Advocate Encounter   Received notification from [facility] that prior authorization for Zetia 60m  is required/requested.     PA submitted on 2.16.24 to (ins) [employer]  via CoverMyMeds Key [id number]  Status is pending

## 2023-01-30 NOTE — Telephone Encounter (Signed)
Pharmacy Patient Advocate Encounter  Prior Authorization for Zetia 32m has been Approved.    key# [id number]   Effective dates: 2.16.24 through 2.15.25

## 2023-01-31 ENCOUNTER — Ambulatory Visit: Payer: BC Managed Care – PPO | Attending: Student

## 2023-01-31 DIAGNOSIS — R0683 Snoring: Secondary | ICD-10-CM

## 2023-01-31 DIAGNOSIS — R400 Somnolence: Secondary | ICD-10-CM

## 2023-01-31 NOTE — Procedures (Signed)
SLEEP STUDY REPORT Patient Information Study Date: 01/26/2023 Patient Name: Leslie [name] Patient ID: [id number] Birth Date: May 21, 1976 Age: 47 Gender: Female BMI: 31.2 (W=218 lb, H=5' 10'') Referring Physician: Sande Rives, PA  TEST DESCRIPTION:  Home sleep apnea testing was completed using the WatchPat, a Type 1 device, utilizing peripheral arterial tonometry (PAT), chest movement, actigraphy, pulse oximetry, pulse rate, body position and snore.  AHI was calculated with apnea and hypopnea using valid sleep time as the denominator. RDI includes apneas, hypopneas, and RERAs.  The data acquired and the scoring of sleep and all associated events were performed in accordance with the recommended standards and specifications as outlined in the AASM Manual for the Scoring of Sleep and Associated Events 2.2.0 (2015).  FINDINGS:  1.  Moderate Obstructive Sleep Apnea with AHI 16.3/hr.   2.  No Central Sleep Apnea with pAHIc 5.3/hr.  3.  Oxygen desaturations as low as 87%.  4.  Mild to moderate snoring was present. O2 sats were < 88% for 0.4 min.  5.  Total sleep time was 5 hrs and 12 min.  6.  30.3% of total sleep time was spent in REM sleep.   7.  Shortened sleep onset latency at 6 min  8.  Shortened REM sleep onset latency at 30 min.   9.  Total awakenings were 13.  10. Arrhythmia detection:  None  DIAGNOSIS:   Moderate Obstructive Sleep Apnea (G47.33)  RECOMMENDATIONS:   1.  Clinical correlation of these findings is necessary.  The decision to treat obstructive sleep apnea (OSA) is usually based on the presence of apnea symptoms or the presence of associated medical conditions such as Hypertension, Congestive Heart Failure, Atrial Fibrillation or Obesity.  The most common symptoms of OSA are snoring, gasping for breath while sleeping, daytime sleepiness and fatigue.   2.  Initiating apnea therapy is recommended given the presence of symptoms and/or associated conditions. Recommend  proceeding with one of the following:     a.  Auto-CPAP therapy with a pressure range of 5-20cm H2O.     b.  An oral appliance (OA) that can be obtained from certain dentists with expertise in sleep medicine.  These are primarily of use in non-obese patients with mild and moderate disease.     c.  An ENT consultation which may be useful to look for specific causes of obstruction and possible treatment options.     d.  If patient is intolerant to PAP therapy, consider referral to ENT for evaluation for hypoglossal nerve stimulator.   3.  Close follow-up is necessary to ensure success with CPAP or oral appliance therapy for maximum benefit.  4.  A follow-up oximetry study on CPAP is recommended to assess the adequacy of therapy and determine the need for supplemental oxygen or the potential need for Bi-level therapy.  An arterial blood gas to determine the adequacy of baseline ventilation and oxygenation should also be considered.  5.  Healthy sleep recommendations include:  adequate nightly sleep (normal 7-9 hrs/night), avoidance of caffeine after noon and alcohol near bedtime, and maintaining a sleep environment that is cool, dark and quiet.  6.  Weight loss for overweight patients is recommended.  Even modest amounts of weight loss can significantly improve the severity of sleep apnea.  7.  Snoring recommendations include:  weight loss where appropriate, side sleeping, and avoidance of alcohol before bed.  8.  Operation of motor vehicle should not be performed when sleepy. Signature: Fransico Him, MD; [facility];  Diplomat, Tax adviser of Sleep Medicine Electronically Signed: 01/31/2023

## 2023-02-02 ENCOUNTER — Telehealth: Payer: Self-pay | Admitting: Student

## 2023-02-02 NOTE — Telephone Encounter (Signed)
Over-read per [facility].  ADDENDUM: Medial right lung base nodularity measures 1.6 by 0.9 cm on series 2, image 46. 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   These results will be called to the ordering clinician or representative by the Radiologist Assistant, and communication documented in the PACS or [employer].     Electronically Signed   By: Donavan Foil M.D.   On: 01/30/2023 22:48   Will send to ordering provider to advise.

## 2023-02-02 NOTE — Telephone Encounter (Signed)
Diane from [facility] is calling to give critical results for patients CT. Call was transferred to triage.

## 2023-02-03 ENCOUNTER — Telehealth: Payer: Self-pay | Admitting: Family Medicine

## 2023-02-03 NOTE — Telephone Encounter (Signed)
Pt is wanting her fmla extended, it is due to run out in March. She is in the hospital today with Warnell Bureau, her grandfather. Please advise pt at [phone] [facility])

## 2023-02-03 NOTE — Telephone Encounter (Signed)
Leslie [name], can you please let the patient know that every time we order a CT cardiac score a Cardiologist reads the study and assesses the coronary calcium score (which she was already notified of) and then a Radiologist reads it and assess all the non-cardiac things. We just got the Radiologist's read back and it showed a nodularity in the right lung base that looked like an area of fibrosis (scarring). However, they recommended a repeat CT scan in 3-6 months to make sure this is stable and has not enlarged. Ideally, the patient's PCP would be the one to order this and follow this. Can you please notify the patient and send to patient's PCP as well so they can follow this in the future?  Thank you so much! Leslie [name]

## 2023-02-04 ENCOUNTER — Telehealth: Payer: Self-pay | Admitting: *Deleted

## 2023-02-04 DIAGNOSIS — R400 Somnolence: Secondary | ICD-10-CM

## 2023-02-04 DIAGNOSIS — G4733 Obstructive sleep apnea (adult) (pediatric): Secondary | ICD-10-CM

## 2023-02-04 NOTE — Telephone Encounter (Signed)
-----   Message from Lauralee Evener, [place] sent at 02/02/2023  8:41 AM EST -----  ----- Message ----- From: Sueanne Margarita, MD Sent: 01/31/2023   9:30 AM EST To: Cv Div Sleep Studies  Please let patient know that they have sleep apnea and recommend treating with CPAP.  Please order an auto CPAP from 4-15cm H2O with heated humidity and mask of choice.  Order overnight pulse ox on CPAP.  Followup with me in 6 weeks.

## 2023-02-04 NOTE — Telephone Encounter (Signed)
The patient has been notified of the result and verbalized understanding.  All questions (if any) were answered. Leslie [name], [place] 02/04/2023 5:51 PM    Upon patient request DME selection is [facility]. Patient understands he will be contacted by [facility] to set up his cpap. Patient understands to call if [place] does not contact him with new setup in a timely manner. Patient understands they will be called once confirmation has been received from [facility]/ that they have received their new machine to schedule 10 week follow up appointment.   [place] notified of new cpap order  Please add to airview Patient was grateful for the call and thanked me.

## 2023-02-04 NOTE — Telephone Encounter (Signed)
Sent mychart message

## 2023-02-04 NOTE — Telephone Encounter (Signed)
Pt informed of providers result & recommendations. Pt verbalized understanding. Pt states that she has had "diarrhea ever since starting this medication(ezetimibe)" she will stop medication because she cannot "spend all day in the bathroom" Forwarded to PCP via EPIC fax function.

## 2023-02-04 NOTE — Telephone Encounter (Signed)
Zetia can cause diarrhea. That's fine - she can stop it. Can re-discuss options for treating her cholesterol at next office visit.   Thank you!

## 2023-02-04 NOTE — Addendum Note (Signed)
Addended by: Freada Bergeron on: 02/04/2023 06:15 PM   Modules accepted: Orders

## 2023-02-06 NOTE — Addendum Note (Signed)
Addended by: Freada Bergeron on: 02/06/2023 05:06 PM   Modules accepted: Orders

## 2023-02-09 NOTE — Telephone Encounter (Signed)
Patient asking to have FMLA extended to care for her grandfather. Okay to have forms filled out?

## 2023-02-11 NOTE — Telephone Encounter (Signed)
Called patient to inform that FMLA form can be extended checking to see if patient had froms to be filled out for extension. No answer LMTCB

## 2023-02-14 ENCOUNTER — Encounter: Payer: Self-pay | Admitting: Family Medicine

## 2023-02-18 ENCOUNTER — Telehealth: Payer: Self-pay | Admitting: Family Medicine

## 2023-02-18 NOTE — Telephone Encounter (Signed)
Pt is requesting that her fmla extension papers be filled out. She is going to fax them tomorrow.

## 2023-02-19 ENCOUNTER — Encounter: Payer: Self-pay | Admitting: Family Medicine

## 2023-02-19 NOTE — Telephone Encounter (Signed)
Paperwork send in by pt via [place]. See Patient Message on 02/19/23  PAPERWORK/FORMS received  Dropped off by: Joycelyn Schmid via [place] Call back #: [phone] Individual made aware of 3-5 business day turn around (YES/NO): Y GREEN charge sheet completed and patient made aware of possible charge (YES/NO): N/A Placed in provider folder at front desk. ~~~ route to CMA/provider Team  CLINICAL USE BELOW THIS LINE (use X to signify action taken)  __x_ Form received and placed in providers office for signature. ___ Form completed and faxed to LOA Dept.  ___ Form completed & LVM to notify patient ready for pick up.  ___ Charge sheet and copy of form in front office folder for office supervisor.

## 2023-02-21 ENCOUNTER — Encounter: Payer: Self-pay | Admitting: Family Medicine

## 2023-02-23 DIAGNOSIS — G4733 Obstructive sleep apnea (adult) (pediatric): Secondary | ICD-10-CM | POA: Diagnosis not present

## 2023-02-24 NOTE — Telephone Encounter (Signed)
PAPERWORK/FORMS received 02/19/2023 Dropped off by: Sent via [place] Call back #: [phone] Individual made aware of 3-5 business day turn around (YES/NO): Y GREEN charge sheet completed and patient made aware of possible charge (YES/NO): Y Placed in provider folder at front desk. ~~~ route to CMA/provider Team  CLINICAL USE BELOW THIS LINE (use X to signify action taken)  _x__ Form received and placed in providers office for signature. ___ Form completed and faxed to LOA Dept.  ___ Form completed & LVM to notify patient ready for pick up.  ___ Charge sheet and copy of form in front office folder for office supervisor.

## 2023-03-16 ENCOUNTER — Telehealth: Payer: Self-pay | Admitting: Cardiology

## 2023-03-16 ENCOUNTER — Ambulatory Visit (INDEPENDENT_AMBULATORY_CARE_PROVIDER_SITE_OTHER): Payer: BC Managed Care – PPO | Admitting: Family Medicine

## 2023-03-16 ENCOUNTER — Encounter: Payer: Self-pay | Admitting: Family Medicine

## 2023-03-16 ENCOUNTER — Telehealth: Payer: Self-pay

## 2023-03-16 ENCOUNTER — Other Ambulatory Visit: Payer: Self-pay

## 2023-03-16 ENCOUNTER — Telehealth: Payer: Self-pay | Admitting: Family Medicine

## 2023-03-16 VITALS — BP 144/92 | HR 80 | Temp 98.1°F | Ht 70.0 in | Wt 216.6 lb

## 2023-03-16 DIAGNOSIS — Z0000 Encounter for general adult medical examination without abnormal findings: Secondary | ICD-10-CM

## 2023-03-16 DIAGNOSIS — R7303 Prediabetes: Secondary | ICD-10-CM | POA: Diagnosis not present

## 2023-03-16 DIAGNOSIS — D508 Other iron deficiency anemias: Secondary | ICD-10-CM | POA: Diagnosis not present

## 2023-03-16 DIAGNOSIS — Z79899 Other long term (current) drug therapy: Secondary | ICD-10-CM

## 2023-03-16 DIAGNOSIS — F419 Anxiety disorder, unspecified: Secondary | ICD-10-CM | POA: Diagnosis not present

## 2023-03-16 DIAGNOSIS — E876 Hypokalemia: Secondary | ICD-10-CM

## 2023-03-16 DIAGNOSIS — R911 Solitary pulmonary nodule: Secondary | ICD-10-CM

## 2023-03-16 DIAGNOSIS — E785 Hyperlipidemia, unspecified: Secondary | ICD-10-CM

## 2023-03-16 DIAGNOSIS — I10 Essential (primary) hypertension: Secondary | ICD-10-CM

## 2023-03-16 MED ORDER — PAROXETINE HCL ER 25 MG PO TB24: 25.0000 mg | ORAL_TABLET | Freq: Every day | ORAL | 1 refills | Status: DC

## 2023-03-16 MED ORDER — AMLODIPINE BESYLATE 5 MG PO TABS: 5.0000 mg | ORAL_TABLET | Freq: Every day | ORAL | 3 refills | Status: DC

## 2023-03-16 MED ORDER — POTASSIUM CHLORIDE CRYS ER 20 MEQ PO TBCR: 20.0000 meq | EXTENDED_RELEASE_TABLET | Freq: Two times a day (BID) | ORAL | 3 refills | Status: DC

## 2023-03-16 NOTE — Telephone Encounter (Signed)
Pt was seen today 03/16/23 and needs to schedule a 3 mon f/up. She would like to know if this should be an office visit or a virtual appointment?

## 2023-03-16 NOTE — Telephone Encounter (Signed)
Fyi. Unable to draw patient's labs here at office. After 3 sticks we offered patient to go to our main lab at [place]. She declined and stated that she would prefer [facility] since they normally get her blood work there with no problem. Labs reordered for [facility] and requisitions printed as well.

## 2023-03-16 NOTE — Telephone Encounter (Signed)
Patient walked in requesting refill on potassium, and amlodipine.  Patient also wanted to make MD aware that Ezetimibe medication is making patient sick and she can not take this medication.

## 2023-03-16 NOTE — Addendum Note (Signed)
Addended by: Renaee Munda on: 03/16/2023 11:59 AM   Modules accepted: Orders

## 2023-03-16 NOTE — Telephone Encounter (Signed)
Spoke to patient. She states she is  in the office now getting labs for Dr Ethelene Hal and labs for Dr Stanford Breed ( hepatic,. LIPID) .    She states she stop taking Cholesterol  medication @ Feb 28. She states she took it about 3 days  each day diarrhea - so she stopped taking Zetia." I can't  stay in the bathroom all dayso I stopped taking it "     Also she  needs prescription refilled    Amlodipine  5 mg - will be send so she will not have spilt a 10 mg tablet   Potassium 20 meq   twice a day -  tablet form . Patient complains about how big  the pills  are. RN  asked patient if she would like medication changed to capusle form if possible patient staed yes .

## 2023-03-16 NOTE — Progress Notes (Signed)
Established Patient Office Visit   Subjective:  Patient ID: Leslie [name], female    DOB: 1976/09/25  Age: 47 y.o. MRN: [id number]  Chief Complaint  Patient presents with   Annual Exam    CPE, no concerns. Patient fasting would like to discuss FMLA forms.     HPI Encounter Diagnoses  Name Primary?   Other iron deficiency anemia Yes   Healthcare maintenance    Anxiety    Hypokalemia    Uncontrolled hypertension    Prediabetes    Pulmonary nodule    For follow-up of above.  Continues Paxil CR 25 mg nightly for anxiety.  It is working well for her.  She is not exercising regularly.  Recent lab work obtained by cardiology.  Potassium is low.  They advised her to double up on her potassium for a week.  She says that it is difficult for her to remember to take her potassium.  She feels like she does most nights.  Continues follow-up with cardiology for hypertension.  Pulmonary nodule was seen on recent cardiac calcium scoring exam.  Patient believes that she may have been referred to a pulmonologist but is not sure.  She says that she is not tolerating [place].   Review of Systems  Constitutional: Negative.   HENT: Negative.    Eyes:  Negative for blurred vision, discharge and redness.  Respiratory: Negative.    Cardiovascular: Negative.   Gastrointestinal:  Negative for abdominal pain.  Genitourinary: Negative.   Musculoskeletal: Negative.  Negative for myalgias.  Skin:  Negative for rash.  Neurological:  Negative for tingling, loss of consciousness and weakness.  Endo/Heme/Allergies:  Negative for polydipsia.     Current Outpatient Medications:    albuterol (VENTOLIN HFA) 108 (90 Base) MCG/ACT inhaler, INHALE 1-2 PUFFS BY MOUTH EVERY 6 HOURS AS NEEDED FOR WHEEZE OR SHORTNESS OF BREATH, Disp: 1 each, Rfl: 0   amLODipine (NORVASC) 10 MG tablet, Take 0.5 tablets (5 mg total) by mouth daily., Disp: 45 tablet, Rfl: 1   Ascorbic Acid (VITAMIN C) 100 MG tablet, Take 100 mg by mouth  daily., Disp: , Rfl:    aspirin EC 81 MG tablet, Take 81 mg by mouth daily. Swallow whole., Disp: , Rfl:    clotrimazole-betamethasone (LOTRISONE) cream, Apply 1 Application topically 2 (two) times daily., Disp: 30 g, Rfl: 3   ferrous sulfate 325 (65 FE) MG tablet, TAKE 1 TABLET BY MOUTH 3 TIMES DAILY WITH MEALS., Disp: 270 tablet, Rfl: 1   FLOVENT HFA 110 MCG/ACT inhaler, Inhale 2 puffs into the lungs 2 (two) times daily., Disp: , Rfl:    labetalol (NORMODYNE) 200 MG tablet, TAKE 1/2 TAB (100 MG) BY MOUTH EVERY MORNING AND 1 TAB EVERY EVENING. (Patient taking differently: Take 200 mg by mouth at bedtime.), Disp: 135 tablet, Rfl: 2   Loratadine (CLARITIN PO), Take by mouth daily., Disp: , Rfl:    Melatonin 5 MG CHEW, Chew 5 mg by mouth., Disp: , Rfl:    Multiple Vitamins-Minerals (AIRBORNE PO), Take by mouth., Disp: , Rfl:    mupirocin ointment (BACTROBAN) 2 %, Apply 1 Application topically 2 (two) times daily., Disp: 22 g, Rfl: 0   potassium chloride SA (KLOR-CON) 20 MEQ tablet, Take 1 tablet (20 mEq total) by mouth 2 (two) times daily. Pt states tablet is too big and she cuts tablet in in 1/4, Disp: 180 tablet, Rfl: 3   valsartan-hydrochlorothiazide (DIOVAN-HCT) 320-25 MG tablet, TAKE 1 TABLET EVERY DAY, Disp: 90 tablet,  Rfl: 3   ezetimibe (ZETIA) 10 MG tablet, Take 1 tablet (10 mg total) by mouth daily. (Patient not taking: Reported on 03/16/2023), Disp: 30 tablet, Rfl: 3   PARoxetine (PAXIL-CR) 25 MG 24 hr tablet, Take 1 tablet (25 mg total) by mouth daily., Disp: 90 tablet, Rfl: 1   terbinafine (LAMISIL) 250 MG tablet, Take 1 tablet (250 mg total) by mouth daily. (Patient not taking: Reported on 03/16/2023), Disp: 30 tablet, Rfl: 0   Objective:     BP (!) 144/92 (BP Location: Right Arm, Patient Position: Sitting, Cuff Size: Large)   Pulse 80   Temp 98.1 F (36.7 C) (Temporal)   Ht 5\' 10"  (1.778 m)   Wt 216 lb 9.6 oz (98.2 kg)   SpO2 97%   BMI 31.08 kg/m  Wt Readings from Last 3  Encounters:  03/16/23 216 lb 9.6 oz (98.2 kg)  01/13/23 217 lb (98.4 kg)  12/16/22 212 lb (96.2 kg)      Physical Exam Constitutional:      General: She is not in acute distress.    Appearance: Normal appearance. She is not ill-appearing, toxic-appearing or diaphoretic.  HENT:     Head: Normocephalic and atraumatic.     Right Ear: External ear normal.     Left Ear: External ear normal.     Mouth/Throat:     Mouth: Mucous membranes are moist.     Pharynx: Oropharynx is clear. No oropharyngeal exudate or posterior oropharyngeal erythema.  Eyes:     General: No scleral icterus.       Right eye: No discharge.        Left eye: No discharge.     Extraocular Movements: Extraocular movements intact.     Conjunctiva/sclera: Conjunctivae normal.     Pupils: Pupils are equal, round, and reactive to light.  Cardiovascular:     Rate and Rhythm: Normal rate and regular rhythm.  Pulmonary:     Effort: Pulmonary effort is normal. No respiratory distress.     Breath sounds: Normal breath sounds.  Skin:    General: Skin is warm and dry.  Neurological:     Mental Status: She is alert and oriented to person, place, and time.  Psychiatric:        Mood and Affect: Mood normal.        Behavior: Behavior normal.      No results found for any visits on 03/16/23.    The 10-year ASCVD risk score (Arnett DK, et al., 2019) is: 4.9%    Assessment & Plan:   Other iron deficiency anemia -     CBC -     Iron, TIBC and Ferritin Panel  Healthcare maintenance  Anxiety -     PARoxetine HCl ER; Take 1 tablet (25 mg total) by mouth daily.  Dispense: 90 tablet; Refill: 1  Hypokalemia -     Basic metabolic panel  Uncontrolled hypertension  Prediabetes -     Basic metabolic panel -     Hemoglobin A1c  Pulmonary nodule -     CT CHEST NODULE FOLLOW UP WO CONTRAST; Future    Return in about 3 months (around 06/15/2023).  Will follow-up with cardiology for hypertension and elevated  cholesterol.  Rechecking potassium today.  Rechecking iron levels and hemoglobin A1c.  Ordered follow-up CT chest for nodule seen in February.  [name], MD

## 2023-03-17 ENCOUNTER — Ambulatory Visit: Payer: BC Managed Care – PPO | Admitting: Family Medicine

## 2023-03-17 LAB — IRON,TIBC AND FERRITIN PANEL
Ferritin: 60 ng/mL (ref 15–150)
Iron Saturation: 15 % (ref 15–55)
Iron: 55 ug/dL (ref 27–159)
Total Iron Binding Capacity: 359 ug/dL (ref 250–450)
UIBC: 304 ug/dL (ref 131–425)

## 2023-03-17 LAB — CBC
Hematocrit: 41 % (ref 34.0–46.6)
Hemoglobin: 13.3 g/dL (ref 11.1–15.9)
MCH: 28.5 pg (ref 26.6–33.0)
MCHC: 32.4 g/dL (ref 31.5–35.7)
MCV: 88 fL (ref 79–97)
Platelets: 171 10*3/uL (ref 150–450)
RBC: 4.66 x10E6/uL (ref 3.77–5.28)
RDW: 14.4 % (ref 11.7–15.4)
WBC: 5.7 10*3/uL (ref 3.4–10.8)

## 2023-03-17 LAB — HEMOGLOBIN A1C
Est. average glucose Bld gHb Est-mCnc: 131 mg/dL
Hgb A1c MFr Bld: 6.2 % — ABNORMAL HIGH (ref 4.8–5.6)

## 2023-03-18 NOTE — Telephone Encounter (Signed)
Returned patients call regarding office visit vs virtual. No answer LM on identified VM informing patient that she will need to schedule OV and to give [place] a call back to schedule

## 2023-03-25 ENCOUNTER — Encounter: Payer: Self-pay | Admitting: Family Medicine

## 2023-03-26 DIAGNOSIS — G4733 Obstructive sleep apnea (adult) (pediatric): Secondary | ICD-10-CM | POA: Diagnosis not present

## 2023-03-30 ENCOUNTER — Encounter: Payer: Self-pay | Admitting: Cardiology

## 2023-04-16 ENCOUNTER — Telehealth: Payer: BC Managed Care – PPO | Admitting: Physician Assistant

## 2023-04-16 DIAGNOSIS — B9689 Other specified bacterial agents as the cause of diseases classified elsewhere: Secondary | ICD-10-CM | POA: Diagnosis not present

## 2023-04-16 DIAGNOSIS — J028 Acute pharyngitis due to other specified organisms: Secondary | ICD-10-CM | POA: Diagnosis not present

## 2023-04-16 DIAGNOSIS — B353 Tinea pedis: Secondary | ICD-10-CM

## 2023-04-16 DIAGNOSIS — R051 Acute cough: Secondary | ICD-10-CM | POA: Diagnosis not present

## 2023-04-16 MED ORDER — BENZONATATE 100 MG PO CAPS
100.0000 mg | ORAL_CAPSULE | Freq: Three times a day (TID) | ORAL | 0 refills | Status: DC | PRN
Start: 2023-04-16 — End: 2023-07-08

## 2023-04-16 MED ORDER — AZITHROMYCIN 250 MG PO TABS
ORAL_TABLET | ORAL | 0 refills | Status: AC
Start: 2023-04-16 — End: 2023-04-21

## 2023-04-16 MED ORDER — TERBINAFINE HCL 250 MG PO TABS
250.0000 mg | ORAL_TABLET | Freq: Every day | ORAL | 0 refills | Status: DC
Start: 2023-04-16 — End: 2023-07-14

## 2023-04-16 NOTE — Patient Instructions (Signed)
Leslie [name], thank you for joining Margaretann Loveless, PA-C for today's virtual visit.  While this provider is not your primary care provider (PCP), if your PCP is located in our provider database this encounter information will be shared with them immediately following your visit.   A [facility] MyChart account gives you access to today's visit and all your visits, tests, and labs performed at [facility] " click here if you don't have a [facility] MyChart account or go to [email or web address]  Consent: (Patient) Leslie [name] provided verbal consent for this virtual visit at the beginning of the encounter.  Current Medications:  Current Outpatient Medications:    azithromycin (ZITHROMAX) 250 MG tablet, Take 2 tablets on day 1, then 1 tablet daily on days 2 through 5, Disp: 6 tablet, Rfl: 0   benzonatate (TESSALON) 100 MG capsule, Take 1 capsule (100 mg total) by mouth 3 (three) times daily as needed., Disp: 30 capsule, Rfl: 0   terbinafine (LAMISIL) 250 MG tablet, Take 1 tablet (250 mg total) by mouth daily., Disp: 30 tablet, Rfl: 0   albuterol (VENTOLIN HFA) 108 (90 Base) MCG/ACT inhaler, INHALE 1-2 PUFFS BY MOUTH EVERY 6 HOURS AS NEEDED FOR WHEEZE OR SHORTNESS OF BREATH, Disp: 1 each, Rfl: 0   amLODipine (NORVASC) 5 MG tablet, Take 1 tablet (5 mg total) by mouth daily., Disp: 90 tablet, Rfl: 3   Ascorbic Acid (VITAMIN C) 100 MG tablet, Take 100 mg by mouth daily., Disp: , Rfl:    aspirin EC 81 MG tablet, Take 81 mg by mouth daily. Swallow whole., Disp: , Rfl:    clotrimazole-betamethasone (LOTRISONE) cream, Apply 1 Application topically 2 (two) times daily., Disp: 30 g, Rfl: 3   ezetimibe (ZETIA) 10 MG tablet, Take 1 tablet (10 mg total) by mouth daily. (Patient not taking: Reported on 03/16/2023), Disp: 30 tablet, Rfl: 3   ferrous sulfate 325 (65 FE) MG tablet, TAKE 1 TABLET BY MOUTH 3 TIMES DAILY WITH MEALS., Disp: 270 tablet, Rfl: 1   FLOVENT HFA 110 MCG/ACT  inhaler, Inhale 2 puffs into the lungs 2 (two) times daily., Disp: , Rfl:    labetalol (NORMODYNE) 200 MG tablet, TAKE 1/2 TAB (100 MG) BY MOUTH EVERY MORNING AND 1 TAB EVERY EVENING. (Patient taking differently: Take 200 mg by mouth at bedtime.), Disp: 135 tablet, Rfl: 2   Loratadine (CLARITIN PO), Take by mouth daily., Disp: , Rfl:    Melatonin 5 MG CHEW, Chew 5 mg by mouth., Disp: , Rfl:    Multiple Vitamins-Minerals (AIRBORNE PO), Take by mouth., Disp: , Rfl:    mupirocin ointment (BACTROBAN) 2 %, Apply 1 Application topically 2 (two) times daily., Disp: 22 g, Rfl: 0   PARoxetine (PAXIL-CR) 25 MG 24 hr tablet, Take 1 tablet (25 mg total) by mouth daily., Disp: 90 tablet, Rfl: 1   potassium chloride SA (KLOR-CON M) 20 MEQ tablet, Take 1 tablet (20 mEq total) by mouth 2 (two) times daily., Disp: 180 tablet, Rfl: 3   valsartan-hydrochlorothiazide (DIOVAN-HCT) 320-25 MG tablet, TAKE 1 TABLET EVERY DAY, Disp: 90 tablet, Rfl: 3   Medications ordered in this encounter:  Meds ordered this encounter  Medications   azithromycin (ZITHROMAX) 250 MG tablet    Sig: Take 2 tablets on day 1, then 1 tablet daily on days 2 through 5    Dispense:  6 tablet    Refill:  0    Order Specific Question:   Supervising Provider    Answer:  LAMPTEY, PHILIP O [[id number]]   benzonatate (TESSALON) 100 MG capsule    Sig: Take 1 capsule (100 mg total) by mouth 3 (three) times daily as needed.    Dispense:  30 capsule    Refill:  0    Order Specific Question:   Supervising Provider    Answer:   Merrilee Jansky [[id number]]   terbinafine (LAMISIL) 250 MG tablet    Sig: Take 1 tablet (250 mg total) by mouth daily.    Dispense:  30 tablet    Refill:  0    Order Specific Question:   Supervising Provider    Answer:   Merrilee Jansky [id number]     *If you need refills on other medications prior to your next appointment, please contact your pharmacy*  Follow-Up: Call back or seek an in-person evaluation if the  symptoms worsen or if the condition fails to improve as anticipated.  [facility] [phone]  Other Instructions Pharyngitis  Pharyngitis is inflammation of the throat (pharynx). It is a very common cause of sore throat. Pharyngitis can be caused by a bacteria, but it is usually caused by a virus. Most cases of pharyngitis get better on their own without treatment. What are the causes? This condition may be caused by: Infection by viruses (viral). Viral pharyngitis spreads easily from person to person (is contagious) through coughing, sneezing, and sharing of personal items or utensils such as cups, forks, spoons, and toothbrushes. Infection by bacteria (bacterial). Bacterial pharyngitis may be spread by touching the nose or face after coming in contact with the bacteria, or through close contact, such as kissing. Allergies. Allergies can cause buildup of mucus in the throat (post-nasal drip), leading to inflammation and irritation. Allergies can also cause blocked nasal passages, forcing breathing through the mouth, which dries and irritates the throat. What increases the risk? You are more likely to develop this condition if: You are 35-35 years old. You are exposed to crowded environments such as daycare, school, or dormitory living. You live in a cold climate. You have a weakened disease-fighting (immune) system. What are the signs or symptoms? Symptoms of this condition vary by the cause. Common symptoms of this condition include: Sore throat. Fatigue. Low-grade fever. Stuffy nose (nasal congestion) and cough. Headache. Other symptoms may include: Glands in the neck (lymph nodes) that are swollen. Skin rashes. Plaque-like film on the throat or tonsils. This is often a symptom of bacterial pharyngitis. Vomiting. Red, itchy eyes (conjunctivitis). Loss of appetite. Joint pain and muscle aches. Enlarged tonsils. How is this diagnosed? This condition may be  diagnosed based on your medical history and a physical exam. Your health care provider will ask you questions about your illness and your symptoms. A swab of your throat may be done to check for bacteria (rapid strep test). Other lab tests may also be done, depending on the suspected cause, but these are rare. How is this treated? Many times, treatment is not needed for this condition. Pharyngitis usually gets better in 3-4 days without treatment. Bacterial pharyngitis may be treated with antibiotic medicines. Follow these instructions at home: Medicines Take over-the-counter and prescription medicines only as told by your health care provider. If you were prescribed an antibiotic medicine, take it as told by your health care provider. Do not stop taking the antibiotic even if you start to feel better. Use throat sprays to soothe your throat as told by your health care provider. Children can get pharyngitis. Do not  give your child aspirin because of the association with Reye's syndrome. Managing pain To help with pain, try: Sipping warm liquids, such as broth, herbal tea, or warm water. Eating or drinking cold or frozen liquids, such as frozen ice pops. Gargling with a mixture of salt and water 3-4 times a day or as needed. To make salt water, completely dissolve -1 tsp (3-6 g) of salt in 1 cup (237 mL) of warm water. Sucking on hard candy or throat lozenges. Putting a cool-mist humidifier in your bedroom at night to moisten the air. Sitting in the bathroom with the door closed for 5-10 minutes while you run hot water in the shower.  General instructions  Do not use any products that contain nicotine or tobacco. These products include cigarettes, chewing tobacco, and vaping devices, such as e-cigarettes. If you need help quitting, ask your health care provider. Rest as told by your health care provider. Drink enough fluid to keep your urine pale yellow. How is this prevented? To help  prevent becoming infected or spreading infection: Wash your hands often with soap and water for at least 20 seconds. If soap and water are not available, use hand sanitizer. Do not touch your eyes, nose, or mouth with unwashed hands, and wash hands after touching these areas. Do not share cups or eating utensils. Avoid close contact with people who are sick. Contact a health care provider if: You have large, tender lumps in your neck. You have a rash. You cough up green, yellow-brown, or bloody mucus. Get help right away if: Your neck becomes stiff. You drool or are unable to swallow liquids. You cannot drink or take medicines without vomiting. You have severe pain that does not go away, even after you take medicine. You have trouble breathing, and it is not caused by a stuffy nose. You have new pain and swelling in your joints such as the knees, ankles, wrists, or elbows. These symptoms may represent a serious problem that is an emergency. Do not wait to see if the symptoms will go away. Get medical help right away. Call your local emergency services (911 in the [place] Do not drive yourself to the hospital. Summary Pharyngitis is redness, pain, and swelling (inflammation) of the throat (pharynx). While pharyngitis can be caused by a bacteria, the most common causes are viral. Most cases of pharyngitis get better on their own without treatment. Bacterial pharyngitis is treated with antibiotic medicines. This information is not intended to replace advice given to you by your health care provider. Make sure you discuss any questions you have with your health care provider. Document Revised: 02/27/2021 Document Reviewed: 02/27/2021 Elsevier Patient Education  2023 [employer]    If you have been instructed to have an in-person evaluation today at a local Urgent Care facility, please use the link below. It will take you to a list of all of our available [facility], including  address, phone number and hours of operation. Please do not delay care.  [facility]  If you or a family member do not have a primary care provider, use the link below to schedule a visit and establish care. When you choose a [facility] primary care physician or advanced practice provider, you gain a long-term partner in health. Find a Primary Care Provider  Learn more about [facility]'s in-office and virtual care options:  - Get Care Now
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
## 2023-04-18 ENCOUNTER — Encounter: Payer: Self-pay | Admitting: Family Medicine

## 2023-04-25 DIAGNOSIS — G4733 Obstructive sleep apnea (adult) (pediatric): Secondary | ICD-10-CM | POA: Diagnosis not present

## 2023-04-26 ENCOUNTER — Encounter (HOSPITAL_COMMUNITY): Payer: Self-pay

## 2023-04-26 ENCOUNTER — Telehealth: Payer: BC Managed Care – PPO | Admitting: Family

## 2023-04-26 ENCOUNTER — Ambulatory Visit (HOSPITAL_COMMUNITY)
Admission: RE | Admit: 2023-04-26 | Discharge: 2023-04-26 | Disposition: A | Discharge status: Home / Self Care | Payer: BC Managed Care – PPO | Source: Ambulatory Visit | Attending: Family Medicine | Admitting: Family Medicine

## 2023-04-26 VITALS — BP 164/108 | HR 79 | Temp 98.5°F | Resp 18

## 2023-04-26 DIAGNOSIS — W57XXXA Bitten or stung by nonvenomous insect and other nonvenomous arthropods, initial encounter: Secondary | ICD-10-CM

## 2023-04-26 DIAGNOSIS — S20461A Insect bite (nonvenomous) of right back wall of thorax, initial encounter: Secondary | ICD-10-CM

## 2023-04-26 DIAGNOSIS — S20461D Insect bite (nonvenomous) of right back wall of thorax, subsequent encounter: Secondary | ICD-10-CM

## 2023-04-26 MED ORDER — TRIAMCINOLONE ACETONIDE 0.5 % EX OINT
1.0000 | TOPICAL_OINTMENT | Freq: Two times a day (BID) | CUTANEOUS | 0 refills | Status: DC
Start: 2023-04-26 — End: 2023-12-30

## 2023-04-26 MED ORDER — DOXYCYCLINE HYCLATE 100 MG PO TABS
100.0000 mg | ORAL_TABLET | Freq: Two times a day (BID) | ORAL | 0 refills | Status: DC
Start: 2023-04-26 — End: 2023-07-14
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
## 2023-04-26 NOTE — Patient Instructions (Signed)
Tick Bite Information, Adult  Ticks are insects that draw blood for food. They climb onto people and animals that brush against the leaves and grasses that they live in. They then bite and attach to the skin. Most ticks are harmless, but some ticks may carry germs that can cause disease. These germs are spread to a person through a bite. To lower your risk of getting a disease from a tick bite, make sure you: Take steps to prevent tick bites. Check for ticks after being outdoors where ticks live. Watch for symptoms of disease if a tick attached to you or if you think a tick bit you. How can I prevent tick bites? Take these steps to help prevent tick bites when you go outdoors in an area where ticks live: Before you go outdoors: Wear long sleeves and long pants to protect your skin from ticks. Wear light-colored clothing so you can see ticks easier. Tuck your pant legs into your socks. Apply insect repellent that has DEET (20% or higher), picaridin, or IR3535 in it to the following areas: Any bare skin. Avoid areas around the eyes and mouth. Edges of clothing, like the top of your boots, the bottom of your pant legs, and your sleeve cuffs. Consider applying an insect repellant that contains permethrin. Follow the instructions on the label. Do not apply permethrin directly to the skin. Instead, apply to the following areas: Clothing and shoes. Outdoor gear and tents. When you are outdoors: Avoid walking through areas with long grass. If you are walking on a trail, stay in the middle of the trail so your skin, hair, and clothing do not touch the bushes. Check for ticks on your clothing, hair, and skin often while you are outdoors. Check again before you go inside. When you go indoors: Check your clothing for ticks. Tumble dry clothes in a dryer on high heat for at least 10 minutes. If clothes are damp, additional time may be needed. If clothes require washing, use hot water. Check your gear and  pets. Shower soon after being outdoors. Check your body for ticks. Do a full body check using a mirror. Be sure to check your scalp, neck, armpits, waist, groin, and joint areas. These are the spots where ticks attach themselves most often. What is the best way to remove a tick?  Remove the tick as soon as possible. Removing it can prevent germs from passing to your body. Do not remove the tick with your bare fingers. Do not try to remove a tick with heat, alcohol, petroleum jelly, or fingernail polish. These things can cause the tick to salivate and regurgitate into your bloodstream, increasing your risk of getting a disease. To remove a tick that is crawling on your skin: Go outside and brush the tick off. Use tape or a lint roller. To remove a tick that is attached to your skin: Wash your hands. If you have gloves, put them on. Use a fine-tipped tweezer, curved forceps, or a tick-removal tool to gently grasp the tick as close to your skin and the tick's head as possible. Gently pull with a steady, upward, and even pressure until the tick lets go. While removing the tick: Take care to keep the tick's head attached to its body. Do not twist or jerk the tick. This can make the tick's head or mouth parts break off and stay in your skin. If this happens, try to remove the mouth parts with tweezers. If you cannot remove them, leave   the area alone and let the skin heal. Do not squeeze or crush the tick's body. This could force disease-carrying fluids from the tick into your body. What should I do after removing a tick? Clean the bite area and your hands with soap and water, rubbing alcohol, or an iodine scrub. If an antiseptic cream or ointment is available, put a small amount on the bite area. Wash and disinfect any tools that you used to remove the tick. How should I dispose of a tick? To dispose of a live tick, use one of these methods: Place it in rubbing alcohol. Place it in a sealed bag  or container, and throw it away. Wrap it tightly in tape, and throw it away. Flush it down the toilet. Where to find more information Centers for Disease Control and Prevention: cdc.gov/ticks [place]. Environmental Protection Agency: epa.gov/insect-repellents Contact a health care provider if: You have symptoms of a disease after a tick bite. Symptoms of a tick-borne disease can occur from moments after the tick bites to 30 days after a tick is removed. Symptoms include: Fever or chills. A red rash that makes a circle (bull's-eye rash) in the bite area. Redness and swelling in the bite area. Headache or stiff neck. Muscle, joint, or bone pain. Abnormal tiredness. Numbness in your legs or trouble walking or moving your legs. Tender or swollen lymph glands. Abdominal pain, vomiting, diarrhea, or weight loss. Get help right away if: You are not able to remove a tick. You have muscle weakness or paralysis. Your symptoms get worse or you experience new symptoms. You find an engorged tick on your skin and you are in an area where there is a higher risk of disease from ticks. Summary Ticks may carry germs that can spread to a person through a bite. These germs can cause disease. Wear protective clothing and use insect repellent to prevent tick bites. Follow the instructions on the label. If you find a tick on your body, remove it as soon as possible. If the tick is attached, do not try to remove it with heat, alcohol, petroleum jelly, or fingernail polish. If you have symptoms of a disease after being bitten by a tick, contact a health care provider. This information is not intended to replace advice given to you by your health care provider. Make sure you discuss any questions you have with your health care provider. Document Revised: 03/03/2022 Document Reviewed: 03/03/2022 Elsevier Patient Education  2023 [employer]  
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
## 2023-04-26 NOTE — Discharge Instructions (Addendum)
Pick up medications prescribed for you earlier today via the video visit. You may apply triamcinolone cream twice daily for the next 5 to 7 days to the site of itching. Take the doxycycline sent to the pharmacy by provider from earlier this morning via the video visit as prescribed.  Follow-up with your primary care doctor as needed if you notice any worsening signs of infection such as redness, swelling, pus drainage, or pain.  If you develop any new or worsening symptoms or do not improve in the next 2 to 3 days, please return.  If your symptoms are severe, please go to the emergency room.  Follow-up with your primary care provider for further evaluation and management of your symptoms as well as ongoing wellness visits.  I hope you feel better!

## 2023-04-26 NOTE — ED Triage Notes (Signed)
States there was a tic on her back for a week and was removed 04/18/23. Patient now has a Circle on her back that itches. States it looks like a bulls-eye. Has been using neosporin with no relief.   Patient having diarrhea, fatigue, and dizziness but no fever, nausea/vomiting.

## 2023-04-28 ENCOUNTER — Other Ambulatory Visit: Payer: Self-pay | Admitting: Student

## 2023-05-14 ENCOUNTER — Other Ambulatory Visit: Payer: Self-pay | Admitting: Family

## 2023-05-14 DIAGNOSIS — W57XXXA Bitten or stung by nonvenomous insect and other nonvenomous arthropods, initial encounter: Secondary | ICD-10-CM

## 2023-05-20 ENCOUNTER — Ambulatory Visit: Payer: BC Managed Care – PPO | Admitting: Cardiology

## 2023-05-20 NOTE — Progress Notes (Signed)
This encounter was created in error - please disregard.

## 2023-05-25 ENCOUNTER — Telehealth: Payer: Self-pay | Admitting: Family Medicine

## 2023-05-25 NOTE — Telephone Encounter (Signed)
Called patient to inform that OV needed for evaluation. No answer LMTCB to schedule appointment.

## 2023-05-25 NOTE — Telephone Encounter (Signed)
Caller Name: Kadija Call back phone #: [phone]  Reason for Call: Pt called stating she is experiencing tears that won't stop with all that is going on with grandfather, pt of Dr. Evangeline Gula too. Pt asking if there is a medication that she can take to assist. Advised she may need appt.   She said she has taken valium previously when her mom passed. It was temporary for her.

## 2023-05-29 ENCOUNTER — Encounter: Payer: Self-pay | Admitting: Family Medicine

## 2023-06-08 ENCOUNTER — Encounter: Payer: Self-pay | Admitting: Family Medicine

## 2023-06-08 ENCOUNTER — Ambulatory Visit: Payer: BC Managed Care – PPO | Admitting: Family Medicine

## 2023-06-08 VITALS — BP 144/100 | HR 82 | Temp 98.7°F | Ht 70.0 in | Wt 217.0 lb

## 2023-06-08 DIAGNOSIS — F419 Anxiety disorder, unspecified: Secondary | ICD-10-CM

## 2023-06-08 DIAGNOSIS — Z818 Family history of other mental and behavioral disorders: Secondary | ICD-10-CM | POA: Diagnosis not present

## 2023-06-08 DIAGNOSIS — I10 Essential (primary) hypertension: Secondary | ICD-10-CM | POA: Diagnosis not present

## 2023-06-08 DIAGNOSIS — F4321 Adjustment disorder with depressed mood: Secondary | ICD-10-CM | POA: Diagnosis not present

## 2023-06-08 NOTE — Progress Notes (Signed)
Established Patient Office Visit   Subjective:  Patient ID: Leslie [name], female    DOB: 05/21/1976  Age: 47 y.o. MRN: [id number]  Chief Complaint  Patient presents with   Anxiety    Having issues with anxiety and insomnia    Anxiety Symptoms include insomnia and nervous/anxious behavior.     Encounter Diagnoses  Name Primary?   Grieving Yes   Uncontrolled hypertension    Anxiety    Family history of stress    For follow-up of above.  Status post recent death of her grandfather, Mr. Penni Bombard who is also my patient.  Continues to grieve.  Not sleeping well.  Has been anxious.  History of anxiety treated successfully with Paxil.  She has been taking a quarter of a pill daily.  She tried a full pill at her grandfather's funeral and could not cry.  Other than that she has found Paxil to be helpful.  She has an appointment with cardiology early next month follow-up of hypertension and elevated cholesterol.   Review of Systems  Constitutional: Negative.   HENT: Negative.    Eyes:  Negative for blurred vision, discharge and redness.  Respiratory: Negative.    Cardiovascular: Negative.   Gastrointestinal:  Negative for abdominal pain.  Genitourinary: Negative.   Musculoskeletal: Negative.  Negative for myalgias.  Skin:  Negative for rash.  Neurological:  Negative for tingling, loss of consciousness and weakness.  Endo/Heme/Allergies:  Negative for polydipsia.  Psychiatric/Behavioral:  The patient is nervous/anxious and has insomnia.      Current Outpatient Medications:    albuterol (VENTOLIN HFA) 108 (90 Base) MCG/ACT inhaler, INHALE 1-2 PUFFS BY MOUTH EVERY 6 HOURS AS NEEDED FOR WHEEZE OR SHORTNESS OF BREATH, Disp: 1 each, Rfl: 0   amLODipine (NORVASC) 5 MG tablet, Take 1 tablet (5 mg total) by mouth daily., Disp: 90 tablet, Rfl: 3   Ascorbic Acid (VITAMIN C) 100 MG tablet, Take 100 mg by mouth daily., Disp: , Rfl:    aspirin EC 81 MG tablet, Take 81 mg by mouth daily.  Swallow whole., Disp: , Rfl:    clotrimazole-betamethasone (LOTRISONE) cream, Apply 1 Application topically 2 (two) times daily., Disp: 30 g, Rfl: 3   doxycycline (VIBRA-TABS) 100 MG tablet, Take 1 tablet (100 mg total) by mouth 2 (two) times daily., Disp: 42 tablet, Rfl: 0   ezetimibe (ZETIA) 10 MG tablet, TAKE 1 TABLET BY MOUTH EVERY DAY, Disp: 90 tablet, Rfl: 1   ferrous sulfate 325 (65 FE) MG tablet, TAKE 1 TABLET BY MOUTH 3 TIMES DAILY WITH MEALS., Disp: 270 tablet, Rfl: 1   FLOVENT HFA 110 MCG/ACT inhaler, Inhale 2 puffs into the lungs 2 (two) times daily., Disp: , Rfl:    labetalol (NORMODYNE) 200 MG tablet, TAKE 1/2 TAB (100 MG) BY MOUTH EVERY MORNING AND 1 TAB EVERY EVENING. (Patient taking differently: Take 200 mg by mouth at bedtime.), Disp: 135 tablet, Rfl: 2   Loratadine (CLARITIN PO), Take by mouth daily., Disp: , Rfl:    Melatonin 5 MG CHEW, Chew 5 mg by mouth., Disp: , Rfl:    Multiple Vitamins-Minerals (AIRBORNE PO), Take by mouth., Disp: , Rfl:    mupirocin ointment (BACTROBAN) 2 %, Apply 1 Application topically 2 (two) times daily., Disp: 22 g, Rfl: 0   PARoxetine (PAXIL-CR) 25 MG 24 hr tablet, Take 1 tablet (25 mg total) by mouth daily., Disp: 90 tablet, Rfl: 1   potassium chloride SA (KLOR-CON M) 20 MEQ tablet, Take 1 tablet (  20 mEq total) by mouth 2 (two) times daily., Disp: 180 tablet, Rfl: 3   terbinafine (LAMISIL) 250 MG tablet, Take 1 tablet (250 mg total) by mouth daily., Disp: 30 tablet, Rfl: 0   triamcinolone ointment (KENALOG) 0.5 %, Apply 1 Application topically 2 (two) times daily., Disp: 60 g, Rfl: 0   valsartan-hydrochlorothiazide (DIOVAN-HCT) 320-25 MG tablet, TAKE 1 TABLET EVERY DAY, Disp: 90 tablet, Rfl: 3   benzonatate (TESSALON) 100 MG capsule, Take 1 capsule (100 mg total) by mouth 3 (three) times daily as needed. (Patient not taking: Reported on 06/08/2023), Disp: 30 capsule, Rfl: 0   Objective:     BP (!) 144/100 (BP Location: Right Arm)   Pulse 82    Temp 98.7 F (37.1 C)   Ht 5\' 10"  (1.778 m)   Wt 217 lb (98.4 kg)   LMP 06/07/2023 (Exact Date)   SpO2 97%   BMI 31.14 kg/m    Physical Exam Constitutional:      General: She is not in acute distress.    Appearance: Normal appearance. She is not ill-appearing, toxic-appearing or diaphoretic.  HENT:     Head: Normocephalic and atraumatic.     Right Ear: External ear normal.     Left Ear: External ear normal.  Eyes:     General: No scleral icterus.       Right eye: No discharge.        Left eye: No discharge.     Extraocular Movements: Extraocular movements intact.     Conjunctiva/sclera: Conjunctivae normal.  Pulmonary:     Effort: Pulmonary effort is normal. No respiratory distress.  Skin:    General: Skin is warm and dry.  Neurological:     Mental Status: She is alert and oriented to person, place, and time.  Psychiatric:        Attention and Perception: Perception normal.        Mood and Affect: Mood normal. Affect is tearful.        Speech: Speech normal.        Behavior: Behavior normal.      No results found for any visits on 06/08/23.    The 10-year ASCVD risk score (Arnett DK, et al., 2019) is: 5.5%    Assessment & Plan:   Grieving  Uncontrolled hypertension  Anxiety  Family history of stress    Return in about 4 weeks (around 07/06/2023), or Take a half of the Paxil tablet nightly.Marland Kitchen  Has follow-up planned with cardiology for the first of the month or hypertension and elevated cholesterol.   Mliss Sax, MD

## 2023-06-11 ENCOUNTER — Other Ambulatory Visit: Payer: Self-pay | Admitting: Cardiology

## 2023-06-15 ENCOUNTER — Encounter: Payer: Self-pay | Admitting: Family Medicine
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
## 2023-06-23 ENCOUNTER — Encounter: Payer: Self-pay | Admitting: Cardiology

## 2023-06-23 ENCOUNTER — Ambulatory Visit: Payer: BC Managed Care – PPO | Attending: Cardiology | Admitting: Cardiology

## 2023-06-23 VITALS — BP 180/110 | HR 68 | Ht 70.0 in | Wt 217.6 lb

## 2023-06-23 DIAGNOSIS — I2510 Atherosclerotic heart disease of native coronary artery without angina pectoris: Secondary | ICD-10-CM | POA: Diagnosis not present

## 2023-06-23 DIAGNOSIS — E785 Hyperlipidemia, unspecified: Secondary | ICD-10-CM | POA: Diagnosis not present

## 2023-06-23 DIAGNOSIS — I10 Essential (primary) hypertension: Secondary | ICD-10-CM | POA: Diagnosis not present

## 2023-06-23 DIAGNOSIS — R911 Solitary pulmonary nodule: Secondary | ICD-10-CM | POA: Diagnosis not present

## 2023-06-23 DIAGNOSIS — I2584 Coronary atherosclerosis due to calcified coronary lesion: Secondary | ICD-10-CM

## 2023-06-23 MED ORDER — ROSUVASTATIN CALCIUM 10 MG PO TABS
10.0000 mg | ORAL_TABLET | Freq: Every day | ORAL | 3 refills | Status: DC
Start: 2023-06-23 — End: 2023-10-01

## 2023-06-23 MED ORDER — LABETALOL HCL 200 MG PO TABS
200.0000 mg | ORAL_TABLET | Freq: Two times a day (BID) | ORAL | 3 refills | Status: DC
Start: 2023-06-23 — End: 2023-08-04

## 2023-06-23 NOTE — Patient Instructions (Addendum)
Medication Instructions:   STOP EZETIMIBE  START ROSUVASTATIN 10 MG ONCE DAILY  TAKE LABETALOL 200 MG TWICE DAILY  *If you need a refill on your cardiac medications before your next appointment, please call your pharmacy*   Lab Work:  Your physician recommends that you return for lab work in: [facility]  If you have labs (blood work) drawn today and your tests are completely normal, you will receive your results only by: MyChart Message (if you have MyChart) OR A paper copy in the mail If you have any lab test that is abnormal or we need to change your treatment, we will call you to review the results.   Follow-Up: At [facility], you and your health needs are our priority.  As part of our continuing mission to provide you with exceptional heart care, we have created designated Provider Care Teams.  These Care Teams include your primary Cardiologist (physician) and Advanced Practice Providers (APPs -  Physician Assistants and Nurse Practitioners) who all work together to provide you with the care you need, when you need it.  We recommend signing up for the patient portal called "MyChart".  Sign up information is provided on this After Visit Summary.  MyChart is used to connect with patients for Virtual Visits (Telemedicine).  Patients are able to view lab/test results, encounter notes, upcoming appointments, etc.  Non-urgent messages can be sent to your provider as well.   To learn more about what you can do with MyChart, go to [email or web address].    Your next appointment:   3 month(s)  Provider:   ANY APP

## 2023-06-24 ENCOUNTER — Encounter: Payer: Self-pay | Admitting: Cardiology

## 2023-06-24 ENCOUNTER — Telehealth: Payer: Self-pay | Admitting: *Deleted

## 2023-06-24 DIAGNOSIS — E876 Hypokalemia: Secondary | ICD-10-CM

## 2023-06-24 LAB — BASIC METABOLIC PANEL
BUN/Creatinine Ratio: 15 (ref 9–23)
BUN: 12 mg/dL (ref 6–24)
CO2: 26 mmol/L (ref 20–29)
Calcium: 9.8 mg/dL (ref 8.7–10.2)
Chloride: 100 mmol/L (ref 96–106)
Creatinine, Ser: 0.81 mg/dL (ref 0.57–1.00)
Glucose: 101 mg/dL — ABNORMAL HIGH (ref 70–99)
Potassium: 3.4 mmol/L — ABNORMAL LOW (ref 3.5–5.2)
Sodium: 139 mmol/L (ref 134–144)
eGFR: 89 mL/min/{1.73_m2} (ref >59)

## 2023-06-24 NOTE — Telephone Encounter (Signed)
Spoke with pt, she is only taking her potassium once daily, she will increase to twice daily. Lab orders mailed to the pt

## 2023-06-24 NOTE — Telephone Encounter (Signed)
-----   Message from Lewayne Bunting, MD sent at 06/24/2023  6:29 AM EDT ----- Make sure pt taking kdur; if not, resume, if so increase to 40 meq BID with bmet one week Olga Millers

## 2023-06-29 ENCOUNTER — Encounter: Payer: Self-pay | Admitting: Family Medicine

## 2023-06-30 ENCOUNTER — Encounter: Payer: Self-pay | Admitting: Family Medicine

## 2023-06-30 ENCOUNTER — Other Ambulatory Visit: Payer: Self-pay

## 2023-06-30 DIAGNOSIS — F419 Anxiety disorder, unspecified: Secondary | ICD-10-CM

## 2023-06-30 MED ORDER — PAROXETINE HCL ER 25 MG PO TB24
25.0000 mg | ORAL_TABLET | Freq: Every day | ORAL | 0 refills | Status: DC
Start: 2023-06-30 — End: 2023-07-08

## 2023-06-30 NOTE — Telephone Encounter (Signed)
Letter was created and signed by Dr. Doreene Burke. Patient was notified by phone and mychart that her letter was ready for pick up at the front desk.

## 2023-07-03 DIAGNOSIS — E876 Hypokalemia: Secondary | ICD-10-CM | POA: Diagnosis not present

## 2023-07-04 LAB — BASIC METABOLIC PANEL
BUN/Creatinine Ratio: 21 (ref 9–23)
BUN: 21 mg/dL (ref 6–24)
CO2: 21 mmol/L (ref 20–29)
Calcium: 9.9 mg/dL (ref 8.7–10.2)
Chloride: 105 mmol/L (ref 96–106)
Creatinine, Ser: 0.99 mg/dL (ref 0.57–1.00)
Glucose: 117 mg/dL — ABNORMAL HIGH (ref 70–99)
Potassium: 3.9 mmol/L (ref 3.5–5.2)
Sodium: 140 mmol/L (ref 134–144)
eGFR: 70 mL/min/{1.73_m2} (ref >59)

## 2023-07-07 LAB — LIPID PANEL
Chol/HDL Ratio: 4.1 ratio (ref 0.0–4.4)
Cholesterol, Total: 227 mg/dL — ABNORMAL HIGH (ref 100–199)
HDL: 55 mg/dL (ref >39)
LDL Chol Calc (NIH): 158 mg/dL — ABNORMAL HIGH (ref 0–99)
Triglycerides: 79 mg/dL (ref 0–149)
VLDL Cholesterol Cal: 14 mg/dL (ref 5–40)

## 2023-07-07 LAB — HEPATIC FUNCTION PANEL
ALT: 12 IU/L (ref 0–32)
AST: 13 IU/L (ref 0–40)
Albumin: 4.6 g/dL (ref 3.9–4.9)
Alkaline Phosphatase: 79 IU/L (ref 44–121)
Bilirubin Total: 0.2 mg/dL (ref 0.0–1.2)
Bilirubin, Direct: <0.1 mg/dL (ref 0.00–0.40)
Total Protein: 7.4 g/dL (ref 6.0–8.5)

## 2023-07-07 LAB — BASIC METABOLIC PANEL
BUN/Creatinine Ratio: 18 (ref 9–23)
BUN: 12 mg/dL (ref 6–24)
CO2: 25 mmol/L (ref 20–29)
Calcium: 9 mg/dL (ref 8.7–10.2)
Chloride: 100 mmol/L (ref 96–106)
Creatinine, Ser: 0.67 mg/dL (ref 0.57–1.00)
Glucose: 107 mg/dL — ABNORMAL HIGH (ref 70–99)
Potassium: 3.3 mmol/L — ABNORMAL LOW (ref 3.5–5.2)
Sodium: 139 mmol/L (ref 134–144)
eGFR: 108 mL/min/{1.73_m2} (ref >59)

## 2023-07-07 LAB — SPECIMEN STATUS REPORT

## 2023-07-08 ENCOUNTER — Ambulatory Visit: Payer: BC Managed Care – PPO | Admitting: Family Medicine

## 2023-07-08 ENCOUNTER — Encounter: Payer: Self-pay | Admitting: Family Medicine

## 2023-07-08 VITALS — BP 148/100 | HR 71 | Temp 97.9°F | Ht 70.0 in | Wt 220.8 lb

## 2023-07-08 DIAGNOSIS — F4321 Adjustment disorder with depressed mood: Secondary | ICD-10-CM | POA: Insufficient documentation

## 2023-07-08 DIAGNOSIS — F419 Anxiety disorder, unspecified: Secondary | ICD-10-CM | POA: Diagnosis not present

## 2023-07-08 DIAGNOSIS — R7303 Prediabetes: Secondary | ICD-10-CM

## 2023-07-08 LAB — HEMOGLOBIN A1C: Hgb A1c MFr Bld: 5.9 % (ref 4.6–6.5)

## 2023-07-08 MED ORDER — PAROXETINE HCL ER 12.5 MG PO TB24
12.5000 mg | ORAL_TABLET | Freq: Every day | ORAL | 1 refills | Status: DC
Start: 2023-07-08 — End: 2023-10-08

## 2023-07-08 NOTE — Progress Notes (Signed)
Established Patient Office Visit   Subjective:  Patient ID: Leslie [name], female    DOB: 1976-10-18  Age: 47 y.o. MRN: [id number]  Chief Complaint  Patient presents with   Anxiety    4 week follow up. Pt states there is some improvement. Pt states she is still taking 12.5 of paxil have not started the 25 mg yet.     Anxiety     Encounter Diagnoses  Name Primary?   Anxiety Yes   Grieving    Prediabetes    For follow-up of above she continues with the Paxil 12.5.  She would like to stick with that dose.  It is helping.  She brings her therapy dog, came with her today.  She has not been exercising regularly.   Review of Systems  Constitutional: Negative.   HENT: Negative.    Eyes:  Negative for blurred vision, discharge and redness.  Respiratory: Negative.    Cardiovascular: Negative.   Gastrointestinal:  Negative for abdominal pain.  Genitourinary: Negative.   Musculoskeletal: Negative.  Negative for myalgias.  Skin:  Negative for rash.  Neurological:  Negative for tingling, loss of consciousness and weakness.  Endo/Heme/Allergies:  Negative for polydipsia.     Current Outpatient Medications:    albuterol (VENTOLIN HFA) 108 (90 Base) MCG/ACT inhaler, INHALE 1-2 PUFFS BY MOUTH EVERY 6 HOURS AS NEEDED FOR WHEEZE OR SHORTNESS OF BREATH, Disp: 1 each, Rfl: 0   amLODipine (NORVASC) 5 MG tablet, Take 1 tablet (5 mg total) by mouth daily., Disp: 90 tablet, Rfl: 3   Ascorbic Acid (VITAMIN C) 1000 MG tablet, Take 1,000 mg by mouth daily., Disp: , Rfl:    aspirin EC 81 MG tablet, Take 81 mg by mouth daily. Swallow whole., Disp: , Rfl:    clotrimazole-betamethasone (LOTRISONE) cream, Apply 1 Application topically 2 (two) times daily., Disp: 30 g, Rfl: 3   doxycycline (VIBRA-TABS) 100 MG tablet, Take 1 tablet (100 mg total) by mouth 2 (two) times daily., Disp: 42 tablet, Rfl: 0   ferrous sulfate 325 (65 FE) MG tablet, TAKE 1 TABLET BY MOUTH 3 TIMES DAILY WITH MEALS., Disp: 270  tablet, Rfl: 1   FLOVENT HFA 110 MCG/ACT inhaler, Inhale 2 puffs into the lungs 2 (two) times daily., Disp: , Rfl:    labetalol (NORMODYNE) 200 MG tablet, Take 1 tablet (200 mg total) by mouth 2 (two) times daily. TAKE 1/2 TAB (100 MG) BY MOUTH EVERY MORNING AND 1 TAB EVERY EVENING., Disp: 180 tablet, Rfl: 3   Loratadine (CLARITIN PO), Take by mouth daily., Disp: , Rfl:    Melatonin 5 MG CHEW, Chew 5 mg by mouth., Disp: , Rfl:    Multiple Vitamins-Minerals (AIRBORNE PO), Take by mouth., Disp: , Rfl:    PARoxetine (PAXIL CR) 12.5 MG 24 hr tablet, Take 1 tablet (12.5 mg total) by mouth daily., Disp: 90 tablet, Rfl: 1   potassium chloride SA (KLOR-CON M) 20 MEQ tablet, Take 1 tablet (20 mEq total) by mouth 2 (two) times daily., Disp: 180 tablet, Rfl: 3   rosuvastatin (CRESTOR) 10 MG tablet, Take 1 tablet (10 mg total) by mouth daily., Disp: 90 tablet, Rfl: 3   terbinafine (LAMISIL) 250 MG tablet, Take 1 tablet (250 mg total) by mouth daily., Disp: 30 tablet, Rfl: 0   triamcinolone ointment (KENALOG) 0.5 %, Apply 1 Application topically 2 (two) times daily., Disp: 60 g, Rfl: 0   valsartan-hydrochlorothiazide (DIOVAN-HCT) 320-25 MG tablet, TAKE 1 TABLET EVERY DAY, Disp: 90 tablet,  Rfl: 3   mupirocin ointment (BACTROBAN) 2 %, Apply 1 Application topically 2 (two) times daily., Disp: 22 g, Rfl: 0   Objective:     BP (!) 148/102 (BP Location: Right Arm, Patient Position: Sitting, Cuff Size: Large)   Pulse 71   Temp 97.9 F (36.6 C)   Ht 5\' 10"  (1.778 m)   Wt 220 lb 12.8 oz (100.2 kg)   LMP 06/07/2023 (Exact Date)   BMI 31.68 kg/m  BP Readings from Last 3 Encounters:  07/08/23 (!) 148/102  06/23/23 (!) 180/110  06/08/23 (!) 144/100   Wt Readings from Last 3 Encounters:  07/08/23 220 lb 12.8 oz (100.2 kg)  06/23/23 217 lb 9.6 oz (98.7 kg)  06/08/23 217 lb (98.4 kg)      Physical Exam Constitutional:      General: She is not in acute distress.    Appearance: Normal appearance. She is  not ill-appearing, toxic-appearing or diaphoretic.  HENT:     Head: Normocephalic and atraumatic.     Right Ear: External ear normal.     Left Ear: External ear normal.  Eyes:     General: No scleral icterus.       Right eye: No discharge.        Left eye: No discharge.     Extraocular Movements: Extraocular movements intact.     Conjunctiva/sclera: Conjunctivae normal.  Pulmonary:     Effort: Pulmonary effort is normal. No respiratory distress.  Skin:    General: Skin is warm and dry.  Neurological:     Mental Status: She is alert and oriented to person, place, and time.  Psychiatric:        Mood and Affect: Mood normal.        Behavior: Behavior normal.      No results found for any visits on 07/08/23.    The 10-year ASCVD risk score (Arnett DK, et al., 2019) is: 6.2%    Assessment & Plan:   Anxiety -     PARoxetine HCl ER; Take 1 tablet (12.5 mg total) by mouth daily.  Dispense: 90 tablet; Refill: 1  Grieving -     PARoxetine HCl ER; Take 1 tablet (12.5 mg total) by mouth daily.  Dispense: 90 tablet; Refill: 1  Prediabetes -     Hemoglobin A1c    Return in about 3 months (around 10/08/2023).  Discussed starting metformin if her hemoglobin A1c remains elevated.  Advised that she may experience cramping and loose stool for a few weeks but for most people lab passes.  Could help her lose weight as well.  Information given on exercising to lose weight.  She would like to continue the Paxil 12.5 daily.  Seems to be helping at the lower dose. Mliss Sax, MD

## 2023-07-10 ENCOUNTER — Encounter: Payer: Self-pay | Admitting: Family Medicine

## 2023-07-14 ENCOUNTER — Encounter: Payer: Self-pay | Admitting: Cardiology

## 2023-07-14 ENCOUNTER — Ambulatory Visit: Payer: BC Managed Care – PPO | Attending: Cardiology | Admitting: Cardiology

## 2023-07-14 VITALS — BP 164/118 | HR 81 | Ht 70.0 in | Wt 215.6 lb

## 2023-07-14 DIAGNOSIS — R519 Headache, unspecified: Secondary | ICD-10-CM

## 2023-07-14 DIAGNOSIS — G4733 Obstructive sleep apnea (adult) (pediatric): Secondary | ICD-10-CM | POA: Diagnosis not present

## 2023-07-14 DIAGNOSIS — Z79899 Other long term (current) drug therapy: Secondary | ICD-10-CM | POA: Diagnosis not present

## 2023-07-14 DIAGNOSIS — I10 Essential (primary) hypertension: Secondary | ICD-10-CM

## 2023-07-14 MED ORDER — AMLODIPINE BESYLATE 10 MG PO TABS
10.0000 mg | ORAL_TABLET | Freq: Every day | ORAL | 3 refills | Status: DC
Start: 2023-07-14 — End: 2024-02-05

## 2023-07-14 NOTE — Patient Instructions (Addendum)
Medication Instructions:  Please INCREASE your amlodipine dose to 10 mg daily.  *If you need a refill on your cardiac medications before your next appointment, please call your pharmacy*   Lab Work: None.  If you have labs (blood work) drawn today and your tests are completely normal, you will receive your results only by: MyChart Message (if you have MyChart) OR A paper copy in the mail If you have any lab test that is abnormal or we need to change your treatment, we will call you to review the results.   Testing/Procedures: None.   Follow-Up:   Your next appointment:   4 weeks with APP Jari Favre, PA-C, Tereso Newcomer, PA-C, Eligha Bridegroom, NP)  Then 2 months with  Dr. Armanda Magic, MD.
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
## 2023-07-14 NOTE — Addendum Note (Signed)
Addended by: Luellen Pucker on: 07/14/2023 05:10 PM   Modules accepted: Orders

## 2023-07-15 DIAGNOSIS — Z1231 Encounter for screening mammogram for malignant neoplasm of breast: Secondary | ICD-10-CM | POA: Diagnosis not present

## 2023-07-23 DIAGNOSIS — G43019 Migraine without aura, intractable, without status migrainosus: Secondary | ICD-10-CM | POA: Diagnosis not present

## 2023-07-30 DIAGNOSIS — G43019 Migraine without aura, intractable, without status migrainosus: Secondary | ICD-10-CM | POA: Diagnosis not present

## 2023-07-31 ENCOUNTER — Telehealth: Payer: Self-pay | Admitting: Cardiology

## 2023-07-31 NOTE — Telephone Encounter (Signed)
Patient states she wants referral because the doctor said he couldn't fix her migraines.  Advised her to talk to PCP.  She will call PCP to discuss

## 2023-07-31 NOTE — Telephone Encounter (Signed)
Pt wanting the nurse to send a referral over to the neurologist. Please advise.

## 2023-08-03 ENCOUNTER — Other Ambulatory Visit: Payer: Self-pay | Admitting: Cardiology

## 2023-08-03 DIAGNOSIS — I10 Essential (primary) hypertension: Secondary | ICD-10-CM

## 2023-08-04 ENCOUNTER — Encounter: Payer: Self-pay | Admitting: Pharmacist

## 2023-08-04 ENCOUNTER — Ambulatory Visit: Payer: BC Managed Care – PPO

## 2023-08-04 ENCOUNTER — Ambulatory Visit: Payer: BC Managed Care – PPO | Attending: Cardiology | Admitting: Pharmacist

## 2023-08-04 VITALS — BP 138/89 | HR 80

## 2023-08-04 DIAGNOSIS — I110 Hypertensive heart disease with heart failure: Secondary | ICD-10-CM | POA: Diagnosis not present

## 2023-08-04 DIAGNOSIS — I10 Essential (primary) hypertension: Secondary | ICD-10-CM | POA: Diagnosis not present

## 2023-08-04 DIAGNOSIS — I5032 Chronic diastolic (congestive) heart failure: Secondary | ICD-10-CM

## 2023-08-04 MED ORDER — HYDRALAZINE HCL 25 MG PO TABS: ORAL_TABLET | ORAL | 2 refills | Status: AC

## 2023-08-04 NOTE — Patient Instructions (Addendum)
It was nice meeting you today  We would like your blood pressure to be less than 130/80  Please continue your: Amlodipine 10mg  daily Labetalol 200mg  in morning, 200mg  in evening Valsartan/hydrochlorothiazide 320/25mg  daily  We will add a new medication called hydralazine. I would like you to take one tablet if your systolic blood pressure is greater than 160  Please continue to check your blood pressure at home and record it. Please bring your blood pressure log in to your next appointment  Let me know if you have any questions  Laural Golden, PharmD, BCACP, CDCES, CPP [address], [address] [place], [place], [address] Phone: [phone], Fax: [phone]
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
## 2023-08-11 ENCOUNTER — Ambulatory Visit: Payer: BC Managed Care – PPO | Admitting: Physician Assistant

## 2023-08-26 ENCOUNTER — Other Ambulatory Visit: Payer: Self-pay

## 2023-08-26 ENCOUNTER — Telehealth: Payer: Self-pay | Admitting: Cardiology

## 2023-08-26 DIAGNOSIS — I10 Essential (primary) hypertension: Secondary | ICD-10-CM

## 2023-08-26 MED ORDER — LABETALOL HCL 200 MG PO TABS
200.0000 mg | ORAL_TABLET | Freq: Two times a day (BID) | ORAL | 3 refills | Status: DC
Start: 2023-08-26 — End: 2023-10-01

## 2023-08-26 NOTE — Telephone Encounter (Signed)
Labetalol sent to pharmacy. Baclofen is not prescribed by our office

## 2023-08-26 NOTE — Telephone Encounter (Signed)
*  STAT* If patient is at the pharmacy, call can be transferred to refill team.   1. Which medications need to be refilled? (please list name of each medication and dose if known)   labetalol (NORMODYNE) 200 MG tablet  baclofen (LIORESAL) 10 MG tablet    4. Which pharmacy/location (including street and city if local pharmacy) is medication to be sent to? [id number] - [place], [place] - [address]     5. Do they need a 30 day or 90 day supply? 90

## 2023-08-28 ENCOUNTER — Encounter: Payer: Self-pay | Admitting: *Deleted

## 2023-09-02 ENCOUNTER — Telehealth: Payer: BC Managed Care – PPO | Admitting: Physician Assistant

## 2023-09-02 ENCOUNTER — Encounter: Payer: Self-pay | Admitting: Pharmacist

## 2023-09-02 ENCOUNTER — Ambulatory Visit: Payer: BC Managed Care – PPO | Attending: Cardiovascular Disease | Admitting: Pharmacist

## 2023-09-02 VITALS — BP 130/87 | HR 88

## 2023-09-02 DIAGNOSIS — J029 Acute pharyngitis, unspecified: Secondary | ICD-10-CM

## 2023-09-02 DIAGNOSIS — I10 Essential (primary) hypertension: Secondary | ICD-10-CM
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
## 2023-09-02 NOTE — Patient Instructions (Addendum)
It was good seeing you again  We would like your blood pressure to be less than 130/80  Continue: Labetalol 200mg  twice daily Valsartan 320/25mg  daily Amlodipine 5mg  daily  Continue to check your blood pressure at home and follow up with Dr Mayford Knife in 2 weeks  Laural Golden, PharmD, BCACP, CDCES, CPP [address] [place], [place], [address] Phone: [phone], Fax: [phone]

## 2023-09-02 NOTE — Progress Notes (Signed)

## 2023-09-04 ENCOUNTER — Ambulatory Visit: Payer: BC Managed Care – PPO

## 2023-09-15 ENCOUNTER — Encounter: Payer: Self-pay | Admitting: General Practice

## 2023-09-15 DIAGNOSIS — E785 Hyperlipidemia, unspecified: Secondary | ICD-10-CM | POA: Diagnosis not present

## 2023-09-16 LAB — HEPATIC FUNCTION PANEL
ALT: 14 [IU]/L (ref 0–32)
AST: 17 [IU]/L (ref 0–40)
Albumin: 4.6 g/dL (ref 3.9–4.9)
Alkaline Phosphatase: 74 [IU]/L (ref 44–121)
Bilirubin Total: <0.2 mg/dL (ref 0.0–1.2)
Bilirubin, Direct: <0.1 mg/dL (ref 0.00–0.40)
Total Protein: 7.7 g/dL (ref 6.0–8.5)

## 2023-09-16 LAB — LIPID PANEL
Chol/HDL Ratio: 3.6 {ratio} (ref 0.0–4.4)
Cholesterol, Total: 182 mg/dL (ref 100–199)
HDL: 50 mg/dL (ref >39)
LDL Chol Calc (NIH): 117 mg/dL — ABNORMAL HIGH (ref 0–99)
Triglycerides: 82 mg/dL (ref 0–149)
VLDL Cholesterol Cal: 15 mg/dL (ref 5–40)

## 2023-09-20 ENCOUNTER — Other Ambulatory Visit: Payer: Self-pay | Admitting: Cardiology

## 2023-09-20 NOTE — Progress Notes (Signed)
Cardiology Office Note:    Date:  10/01/2023   ID:  Leslie [name], DOB [birth year], MRN [id number]  PCP:  Mliss Sax, MD  Cardiologist:  Olga Millers, MD     Referring MD: Mliss Sax,*   Chief Complaint: follow-up of hypertension  History of Present Illness:    Leslie [name] is a 47 y.o. female with a history of coronary artery calcification with a coronary calcium score of 8.85 (94th percentile) in 01/2023, diastolic dysfunction on Echo in 2018 but no CHF, hypertension, hyperlipidemia, obstructively sleep apnea on CPAP, asthma, and migraines who is followed by Dr. Jens Som and presents today for routine follow-up of hypertension.   Patient has been followed by Cardiology primarily for hypertension since 2015 (previously followed by Dr. Shirlee Latch and now followed by Dr. Jens Som).  Last Echo in 05/2017 showed LVEF of 60-65% with normal wall motion and grade 2 diastolic dysfunction. Renal ultrasound in 09/2020 showed no evidence of renal artery stenosis. She has had problems with medication non-compliance in the past. She had one isolated episode of chest tightness in 09/2022 that resolved after she used her inhaler and was not felt to be cardiac in nature. CT cardiac scoring was ordered in 01/2023 for risk stratification and to help guide treatment of hyperlipidemia and showed a coronary calcium score of 8.85 (94th percentile for age and sex).   She was last seen by Dr. Jens Som in 06/2023 at which time she was doing well form a cardiac standpoint but BP remained elevated. She had not been taking her Labetalol in the morning. She has since been seen in the PharmD HTN clinic twice and has been hesitant to medication changes. Current hypertension regimen includes: Amlodipine 5mg  daily, Labetalol 100mg  in the morning and 200mg  in the evening, Valsartan-HCTZ 320-25mg  daily, and PRN Hydralazine for systolic BP >160.   Patient presents today for follow-up. She denies any chest  pain, shortness of breath, orthopnea, PND, or edema. She reports occasional brief palpitations that she describes as a "fluttering" or "skipped beat" but no prolonged or sustained palpitations (sounds like premature beats). She also reports one episode of dizziness recently after waking up and standing quickly but this passed quickly. No syncope. Her BP remains elevated. BP initially 162/102 but then 148/92 on my personal recheck at the end of the visit. IT sounds like her systolic BP is often in the 160s a home. She once again states she has not been taking her Labetalol in the morning. We discussed the importance of taking this twice a day. She states the 200mg  in the morning makes her feel fatigued but she is willing to try taking 100mg  int he morning. It does not sound like she is using her CPAP regularly because she does not like the noise it makes. She also reports what sounds like a recent URI with body aches and nasal congestion but these symptoms are starting to improve.   EKGs/Labs/Other Studies Reviewed:    The following studies were reviewed:  Echocardiogram 05/21/2017: Study Conclusions: - Left ventricle: The cavity size was normal. There was moderate    concentric hypertrophy. Systolic function was normal. The    estimated ejection fraction was in the range of 60% to 65%. Wall    motion was normal; there were no regional wall motion    abnormalities. Features are consistent with a pseudonormal left    ventricular filling pattern, with concomitant abnormal relaxation    and increased filling pressure (grade 2 diastolic  dysfunction).    Doppler parameters are consistent with high ventricular filling    pressure.  - Aortic valve: Trileaflet; normal thickness, mildly calcified    leaflets.  - Left atrium: The atrium was mildly dilated. _______________   Renal Ultrasound 09/28/2020: Summary:    - Right: No evidence of right renal artery stenosis. RRV flow present. Normal size right  kidney. Normal right Resisitive Index. Normal cortical thickness of right kidney.  - Left:  No evidence of left renal artery stenosis. LRV flow present. Normal size of left kidney. Normal left Resistive Index. Normal cortical thickness of the left kidney.  - Mesenteric:  - Normal Celiac artery and Superior Mesenteric artery findings.  _______________  CT Cardiac Scoring 01/28/2023: Impressions: Coronary calcium score of 8.85. This is 35 th percentile for age/sex   Dilated main pulmonary artery 3.7 cm   EKG:  EKG not ordered today.  Recent Labs: 03/16/2023: Hemoglobin 13.3; Platelets 171 07/03/2023: BUN 21; Creatinine, Ser 0.99; Potassium 3.9; Sodium 140 09/15/2023: ALT 14  Recent Lipid Panel    Component Value Date/Time   CHOL 182 09/15/2023 1019   TRIG 82 09/15/2023 1019   HDL 50 09/15/2023 1019   CHOLHDL 3.6 09/15/2023 1019   CHOLHDL 5 04/26/2014 1706   VLDL 26.6 04/26/2014 1706   LDLCALC 117 (H) 09/15/2023 1019   LDLDIRECT 136.0 06/10/2021 1541    Physical Exam:    Vital Signs: BP (!) 148/92   Pulse 77   Ht 5\' 10"  (1.778 m)   Wt 220 lb 6.4 oz (100 kg)   SpO2 97%   BMI 31.62 kg/m     Wt Readings from Last 3 Encounters:  10/01/23 220 lb 6.4 oz (100 kg)  09/23/23 215 lb (97.5 kg)  07/14/23 215 lb 9.6 oz (97.8 kg)     General: 47 y.o. African-American female in no acute distress. HEENT: Normocephalic and atraumatic.  Neck: Supple. No carotid bruits. No JVD. Heart: RRR. Distinct S1 and S2. No murmurs, gallops, or rubs.  Lungs: No increased work of breathing. Clear to ausculation bilaterally. No wheezes, rhonchi, or rales.  Abdomen: Soft, non-distended, and non-tender to palpation.  Extremities: No lower extremity edema.  Radial pulses 2+ and equal bilaterally. Skin: Warm and dry. Neuro: No focal deficits. Psych: Normal affect. Responds appropriately.  Assessment:    1. Essential hypertension   2. Coronary artery calcification   3. Diastolic dysfunction   4.  Hyperlipidemia, unspecified hyperlipidemia type   5. Obstructive sleep apnea     Plan:    Hypertension  Patient has a history of difficult to control hypertension and has been very hesitant with medication changes in the past.  - BP elevated. Initially 162/102 and then 148/92 on my personal recheck at the end of the visit. Systolic BP often in the 160s at home.  - Current medication regimen: Amlodipine 10mg  daily, Labetalol 200mg  in the evening (she is supposed to be taking 100mg  in the morning as well but has not been doing this), Valsartan-HCTZ 320-25mg  daily, and PRN Hydralazine for systolic BP >160. - Encouraged patient to take Labetolol 100mg  int he morning and 200mg  in the evening as previously recommended. - Also encouraged patient to use the PRN Hydralazine that she has if systolic BP >160.  - Asked patient to keep a BP/HR log for 2 weeks and then send this to [place].   Coronary Artery Calcifications CT cardiac scoring in 01/2023 showed coronary calcium score of 8.85 (94th percentile for age and sex).  -  No chest pain.  - Continue aspirin and statin.  Diastolic Dysfunction Type 2 diastolic dysfunction noted on last Echo in 2018.  - No signs or symptoms of CHF. Euvolemic on exam. - Continue to focus on good BP control as above.  Hyperlipidemia Lipid panel on 09/15/2023: Total Cholesterol 182, Triglycerides 82, HDL 50, LDL 117.  - She has been intolerant to Zetia in the past.  - She has only been taking Crestor 5mg  daily rather than 10mg  due to abdominal pain with the full tablet. Will continue current dose for now. She is very sensitive to medications and I think we should focus more on getting her BP better controlled right now. Can re-discuss at follow-up visit. Can consider referral to the Lipid Clinic at that time.   Obstructive Sleep Apnea - Continue CPAP. Discussed the importance of this and how untreated sleep apnea can make BP difficult to control.  - Followed by Dr. Mayford Knife.    Disposition: Follow up in 6 months.    Signed, Corrin Parker, PA-C  10/01/2023 8:51 PM    [facility]

## 2023-09-21 ENCOUNTER — Telehealth: Payer: Self-pay | Admitting: Cardiology

## 2023-09-21 NOTE — Telephone Encounter (Signed)
Pt wants to know if her appt on the 10/09 can be virtual. Please advise

## 2023-09-22 NOTE — Telephone Encounter (Signed)
Call to patient to advise office visit has been changed to virtual visit. Patient verbalizes understanding.

## 2023-09-23 ENCOUNTER — Telehealth: Payer: Self-pay

## 2023-09-23 ENCOUNTER — Ambulatory Visit: Payer: BC Managed Care – PPO | Attending: Cardiology | Admitting: Cardiology

## 2023-09-23 VITALS — BP 135/65 | HR 90 | Ht 70.0 in | Wt 215.0 lb

## 2023-09-23 DIAGNOSIS — G4733 Obstructive sleep apnea (adult) (pediatric): Secondary | ICD-10-CM

## 2023-09-23 DIAGNOSIS — I10 Essential (primary) hypertension: Secondary | ICD-10-CM | POA: Diagnosis not present

## 2023-09-23 NOTE — Telephone Encounter (Signed)
Call to patient 2x to try and get vitals/set up for virtual visit with Dr. Mayford Knife. Link sent via email as well as text. Left 2 messages asking patient to call my direct # so I can help her access virtual visit.

## 2023-09-23 NOTE — Patient Instructions (Signed)
Medication Instructions:  Your physician recommends that you continue on your current medications as directed. Please refer to the Current Medication list given to you today.  *If you need a refill on your cardiac medications before your next appointment, please call your pharmacy*   Lab Work: None.  If you have labs (blood work) drawn today and your tests are completely normal, you will receive your results only by: MyChart Message (if you have MyChart) OR A paper copy in the mail If you have any lab test that is abnormal or we need to change your treatment, we will call you to review the results.   Testing/Procedures: None.   Follow-Up:  Your next appointment:   8 week(s)  Provider:   Dr. Armanda Magic, MD   Other Instructions Please make an appointment with your DME company ASAP for mask fit and trouble shooting of your mask.
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
## 2023-09-28 ENCOUNTER — Telehealth: Payer: Self-pay | Admitting: *Deleted

## 2023-09-28 DIAGNOSIS — G4733 Obstructive sleep apnea (adult) (pediatric): Secondary | ICD-10-CM

## 2023-09-28 DIAGNOSIS — I10 Essential (primary) hypertension: Secondary | ICD-10-CM

## 2023-09-28 DIAGNOSIS — R0683 Snoring: Secondary | ICD-10-CM

## 2023-09-28 DIAGNOSIS — R400 Somnolence: Secondary | ICD-10-CM

## 2023-09-28 NOTE — Telephone Encounter (Signed)
Per Dr Mayford Knife, appt with DME ASAP for mask fit and trouble shooting her mask which she cannot use.  Order placed to [employer] via community message.

## 2023-09-28 NOTE — Telephone Encounter (Signed)
-----   Message from [facility] Brandie R sent at 09/28/2023  9:16 AM EDT ----- Hello, Per Dr. Mayford Knife, patient needs appt with DME ASAP for mask fit and trouble shooting her mask which she cannot use.  8 week follow up w/ Dr. Mayford Knife has been scheduled.   Alcario Drought

## 2023-09-29 ENCOUNTER — Telehealth: Payer: Self-pay | Admitting: Family Medicine

## 2023-09-29 NOTE — Telephone Encounter (Signed)
[facility] asking for a call back to discuss CT.

## 2023-09-30 NOTE — Telephone Encounter (Signed)
Spoke to [employer] and they state that they are unsure who it was that called.  If anything else is needed they will call us back. Dm/cma

## 2023-10-01 ENCOUNTER — Ambulatory Visit: Payer: BC Managed Care – PPO | Attending: Student | Admitting: Student

## 2023-10-01 ENCOUNTER — Encounter: Payer: Self-pay | Admitting: Student

## 2023-10-01 VITALS — BP 148/92 | HR 77 | Ht 70.0 in | Wt 220.4 lb

## 2023-10-01 DIAGNOSIS — G4733 Obstructive sleep apnea (adult) (pediatric): Secondary | ICD-10-CM

## 2023-10-01 DIAGNOSIS — I5189 Other ill-defined heart diseases: Secondary | ICD-10-CM

## 2023-10-01 DIAGNOSIS — I2510 Atherosclerotic heart disease of native coronary artery without angina pectoris: Secondary | ICD-10-CM

## 2023-10-01 DIAGNOSIS — E785 Hyperlipidemia, unspecified: Secondary | ICD-10-CM | POA: Diagnosis not present

## 2023-10-01 DIAGNOSIS — I10 Essential (primary) hypertension: Secondary | ICD-10-CM | POA: Diagnosis not present

## 2023-10-01 MED ORDER — LABETALOL HCL 200 MG PO TABS: ORAL_TABLET | ORAL | 3 refills | Status: DC

## 2023-10-01 MED ORDER — ROSUVASTATIN CALCIUM 10 MG PO TABS
5.0000 mg | ORAL_TABLET | Freq: Every day | ORAL | 3 refills | Status: DC
Start: 2023-10-01 — End: 2023-11-23

## 2023-10-01 MED ORDER — LABETALOL HCL 200 MG PO TABS
200.0000 mg | ORAL_TABLET | Freq: Two times a day (BID) | ORAL | 3 refills | Status: DC
Start: 2023-10-01 — End: 2023-10-01

## 2023-10-01 NOTE — Patient Instructions (Addendum)
Medication Instructions:  TAKE THE LABETALOL 100MG  (ONE HALF PILL) IN THE MORNING. TAKE 200MG  (ONE WHOLE PILL) AT NIGHT.  TAKE ROSUVASTATIN 5MG  DAILY  MONITOR YOUR BLOOD PRESSURE FOR 2 WEEKS. UPLOAD YOUR READING TO YOUR MYCHART.  *If you need a refill on your cardiac medications before your next appointment, please call your pharmacy*   Lab Work: NONE If you have labs (blood work) drawn today and your tests are completely normal, you will receive your results only by: MyChart Message (if you have MyChart) OR A paper copy in the mail If you have any lab test that is abnormal or we need to change your treatment, we will call you to review the results.   Testing/Procedures: NONE   Follow-Up: At [facility], you and your health needs are our priority.  As part of our continuing mission to provide you with exceptional heart care, we have created designated Provider Care Teams.  These Care Teams include your primary Cardiologist (physician) and Advanced Practice Providers (APPs -  Physician Assistants and Nurse Practitioners) who all work together to provide you with the care you need, when you need it.  We recommend signing up for the patient portal called "MyChart".  Sign up information is provided on this After Visit Summary.  MyChart is used to connect with patients for Virtual Visits (Telemedicine).  Patients are able to view lab/test results, encounter notes, upcoming appointments, etc.  Non-urgent messages can be sent to your provider as well.   To learn more about what you can do with MyChart, go to [email or web address].    Your next appointment:   6 month(s)  Provider:   Olga Millers, MD OR APP

## 2023-10-08 ENCOUNTER — Ambulatory Visit: Payer: BC Managed Care – PPO | Admitting: Family Medicine

## 2023-10-08 ENCOUNTER — Encounter: Payer: Self-pay | Admitting: Family Medicine

## 2023-10-08 VITALS — BP 132/90 | HR 76 | Temp 98.5°F | Ht 70.0 in | Wt 218.4 lb

## 2023-10-08 DIAGNOSIS — Z6831 Body mass index (BMI) 31.0-31.9, adult: Secondary | ICD-10-CM

## 2023-10-08 DIAGNOSIS — E66811 Obesity, class 1: Secondary | ICD-10-CM | POA: Diagnosis not present

## 2023-10-08 DIAGNOSIS — R7303 Prediabetes: Secondary | ICD-10-CM | POA: Diagnosis not present

## 2023-10-08 DIAGNOSIS — E6609 Other obesity due to excess calories: Secondary | ICD-10-CM | POA: Diagnosis not present

## 2023-10-08 DIAGNOSIS — F419 Anxiety disorder, unspecified: Secondary | ICD-10-CM | POA: Diagnosis not present

## 2023-10-08 DIAGNOSIS — F4321 Adjustment disorder with depressed mood: Secondary | ICD-10-CM

## 2023-10-08 LAB — BASIC METABOLIC PANEL
BUN: 12 mg/dL (ref 6–23)
CO2: 30 meq/L (ref 19–32)
Calcium: 9.4 mg/dL (ref 8.4–10.5)
Chloride: 103 meq/L (ref 96–112)
Creatinine, Ser: 0.77 mg/dL (ref 0.40–1.20)
GFR: 91.22 mL/min (ref >60.00)
Glucose, Bld: 115 mg/dL — ABNORMAL HIGH (ref 70–99)
Potassium: 3.7 meq/L (ref 3.5–5.1)
Sodium: 139 meq/L (ref 135–145)

## 2023-10-08 LAB — HEMOGLOBIN A1C: Hgb A1c MFr Bld: 6.1 % (ref 4.6–6.5)

## 2023-10-08 MED ORDER — PAROXETINE HCL ER 12.5 MG PO TB24
12.5000 mg | ORAL_TABLET | Freq: Every day | ORAL | 1 refills | Status: DC
Start: 2023-10-08 — End: 2024-09-09

## 2023-10-08 MED ORDER — METFORMIN HCL ER 500 MG PO TB24
500.0000 mg | ORAL_TABLET | Freq: Every evening | ORAL | 1 refills | Status: DC
Start: 2023-10-08 — End: 2023-12-18

## 2023-10-08 NOTE — Progress Notes (Signed)
Established Patient Office Visit   Subjective:  Patient ID: Leslie [name], female    DOB: 1976-08-19  Age: 47 y.o. MRN: [id number]  Chief Complaint  Patient presents with   Follow-up    HPI Encounter Diagnoses  Name Primary?   Class 1 obesity due to excess calories with body mass index (BMI) of 31.0 to 31.9 in adult, unspecified whether serious comorbidity present Yes   Anxiety    Grieving    Prediabetes    For follow-up of above.  At this point she feels as though she is at her ideal weight.  She is not exercising outside of work.  As always her work is very stressful.   Review of Systems  Constitutional: Negative.   HENT: Negative.    Eyes:  Negative for blurred vision, discharge and redness.  Respiratory: Negative.    Cardiovascular: Negative.   Gastrointestinal:  Negative for abdominal pain.  Genitourinary: Negative.   Musculoskeletal: Negative.  Negative for myalgias.  Skin:  Negative for rash.  Neurological:  Negative for tingling, loss of consciousness and weakness.  Endo/Heme/Allergies:  Negative for polydipsia.     Current Outpatient Medications:    albuterol (VENTOLIN HFA) 108 (90 Base) MCG/ACT inhaler, INHALE 1-2 PUFFS BY MOUTH EVERY 6 HOURS AS NEEDED FOR WHEEZE OR SHORTNESS OF BREATH, Disp: 1 each, Rfl: 0   amLODipine (NORVASC) 10 MG tablet, Take 1 tablet (10 mg total) by mouth daily., Disp: 180 tablet, Rfl: 3   Ascorbic Acid (VITAMIN C) 1000 MG tablet, Take 1,000 mg by mouth daily., Disp: , Rfl:    aspirin EC 81 MG tablet, Take 81 mg by mouth every 6 (six) hours as needed. Swallow whole., Disp: , Rfl:    baclofen (LIORESAL) 10 MG tablet, Take 10 mg by mouth 2 (two) times daily. 1 -2 times a day as needed, Disp: , Rfl:    Cholecalciferol (VITAMIN D3) 125 MCG (5000 UT) CAPS, Take 1 capsule by mouth daily., Disp: , Rfl:    clotrimazole-betamethasone (LOTRISONE) cream, Apply 1 Application topically 2 (two) times daily., Disp: 30 g, Rfl: 3   ferrous sulfate 325  (65 FE) MG tablet, TAKE 1 TABLET BY MOUTH 3 TIMES DAILY WITH MEALS., Disp: 270 tablet, Rfl: 1   FLOVENT HFA 110 MCG/ACT inhaler, Inhale 2 puffs into the lungs 2 (two) times daily., Disp: , Rfl:    hydrALAZINE (APRESOLINE) 25 MG tablet, Take 1 tablet by mouth twice a day if systolic blood pressure is greater than 160, Disp: 60 tablet, Rfl: 2   labetalol (NORMODYNE) 200 MG tablet, TAKE ONE HALF TABLET IN THE MORNING. TAKE ONE WHOLE TABLET AT NIGHT., Disp: 180 tablet, Rfl: 3   Loratadine (CLARITIN PO), Take by mouth daily., Disp: , Rfl:    Melatonin 5 MG CHEW, Chew 5 mg by mouth., Disp: , Rfl:    metFORMIN (GLUCOPHAGE-XR) 500 MG 24 hr tablet, Take 1 tablet (500 mg total) by mouth at bedtime., Disp: 90 tablet, Rfl: 1   methocarbamol (ROBAXIN) 500 MG tablet, Take 1 tablet by mouth every 8 (eight) hours as needed., Disp: , Rfl:    Multiple Vitamins-Minerals (AIRBORNE PO), Take by mouth., Disp: , Rfl:    Omega 3-6-9 Fatty Acids (OMEGA 3-6-9 COMPLEX) CAPS, Take 1 capsule by mouth daily., Disp: , Rfl:    potassium chloride SA (KLOR-CON M) 20 MEQ tablet, Take 1 tablet (20 mEq total) by mouth 2 (two) times daily., Disp: 180 tablet, Rfl: 3   rosuvastatin (CRESTOR) 10 MG  tablet, Take 0.5 tablets (5 mg total) by mouth daily., Disp: 90 tablet, Rfl: 3   triamcinolone ointment (KENALOG) 0.5 %, Apply 1 Application topically 2 (two) times daily., Disp: 60 g, Rfl: 0   valsartan-hydrochlorothiazide (DIOVAN-HCT) 320-25 MG tablet, TAKE 1 TABLET DAILY, Disp: 90 tablet, Rfl: 3   zonisamide (ZONEGRAN) 25 MG capsule, Take 100 mg by mouth at bedtime., Disp: , Rfl:    PARoxetine (PAXIL CR) 12.5 MG 24 hr tablet, Take 1 tablet (12.5 mg total) by mouth daily., Disp: 90 tablet, Rfl: 1   Objective:     BP (!) 146/94   Pulse 76   Temp 98.5 F (36.9 C) (Temporal)   Ht 5\' 10"  (1.778 m)   Wt 218 lb 6.4 oz (99.1 kg)   LMP 09/06/2023   SpO2 98%   BMI 31.34 kg/m    Physical Exam Constitutional:      General: She is not in  acute distress.    Appearance: Normal appearance. She is not ill-appearing, toxic-appearing or diaphoretic.  HENT:     Head: Normocephalic and atraumatic.     Right Ear: External ear normal.     Left Ear: External ear normal.  Eyes:     General: No scleral icterus.       Right eye: No discharge.        Left eye: No discharge.     Extraocular Movements: Extraocular movements intact.     Conjunctiva/sclera: Conjunctivae normal.  Pulmonary:     Effort: Pulmonary effort is normal. No respiratory distress.  Skin:    General: Skin is warm and dry.  Neurological:     Mental Status: She is alert and oriented to person, place, and time.  Psychiatric:        Mood and Affect: Mood normal.        Behavior: Behavior normal.      No results found for any visits on 10/08/23.    The 10-year ASCVD risk score (Arnett DK, et al., 2019) is: 5.5%    Assessment & Plan:   Class 1 obesity due to excess calories with body mass index (BMI) of 31.0 to 31.9 in adult, unspecified whether serious comorbidity present -     Amb Referral to Nutrition and Diabetic Education  Anxiety -     PARoxetine HCl ER; Take 1 tablet (12.5 mg total) by mouth daily.  Dispense: 90 tablet; Refill: 1  Grieving -     PARoxetine HCl ER; Take 1 tablet (12.5 mg total) by mouth daily.  Dispense: 90 tablet; Refill: 1  Prediabetes -     metFORMIN HCl ER; Take 1 tablet (500 mg total) by mouth at bedtime.  Dispense: 90 tablet; Refill: 1 -     Amb Referral to Nutrition and Diabetic Education -     Basic metabolic panel -     Hemoglobin A1c    Return in about 3 months (around 01/08/2024).  Will start metformin at at bedtime to prevent the onset and progression of her prediabetes.  We have discussed metformin side effects.  Patient in denial about her weight and stage I obesity.  Does not feel that she is overweight.  She was given information about BMI, obesity, prediabetes and a prediabetic eating plan.  Continue Paxil for  anxiety for now.  Could consider changing to a more weight neutral SSRI.  Continues follow-up with cardiology for hypertension.  Mliss Sax, MD

## 2023-11-16 ENCOUNTER — Other Ambulatory Visit: Payer: Self-pay | Admitting: Family Medicine

## 2023-11-16 DIAGNOSIS — D508 Other iron deficiency anemias: Secondary | ICD-10-CM

## 2023-11-17 ENCOUNTER — Other Ambulatory Visit: Payer: Self-pay

## 2023-11-17 DIAGNOSIS — D508 Other iron deficiency anemias: Secondary | ICD-10-CM

## 2023-11-17 MED ORDER — FERROUS SULFATE 325 (65 FE) MG PO TABS: 325.0000 mg | ORAL_TABLET | Freq: Every day | ORAL | 1 refills | Status: DC

## 2023-11-21 DIAGNOSIS — M1712 Unilateral primary osteoarthritis, left knee: Secondary | ICD-10-CM | POA: Diagnosis not present

## 2023-11-21 DIAGNOSIS — M25511 Pain in right shoulder: Secondary | ICD-10-CM | POA: Diagnosis not present

## 2023-11-21 DIAGNOSIS — M25562 Pain in left knee: Secondary | ICD-10-CM | POA: Diagnosis not present

## 2023-11-21 DIAGNOSIS — G8929 Other chronic pain: Secondary | ICD-10-CM | POA: Diagnosis not present

## 2023-11-22 ENCOUNTER — Encounter: Payer: Self-pay | Admitting: Family Medicine

## 2023-11-22 ENCOUNTER — Encounter: Payer: Self-pay | Admitting: Cardiology

## 2023-11-22 DIAGNOSIS — E785 Hyperlipidemia, unspecified: Secondary | ICD-10-CM

## 2023-11-23 MED ORDER — ROSUVASTATIN CALCIUM 5 MG PO TABS
5.0000 mg | ORAL_TABLET | Freq: Every day | ORAL | 3 refills | Status: DC
Start: 2023-11-23 — End: 2023-12-30

## 2023-12-10 ENCOUNTER — Ambulatory Visit: Payer: BC Managed Care – PPO | Admitting: Family Medicine

## 2023-12-10 ENCOUNTER — Encounter: Payer: Self-pay | Admitting: Family Medicine

## 2023-12-10 VITALS — BP 136/86 | HR 78 | Temp 98.4°F | Ht 70.0 in | Wt 227.6 lb

## 2023-12-10 DIAGNOSIS — Z566 Other physical and mental strain related to work: Secondary | ICD-10-CM

## 2023-12-10 DIAGNOSIS — R7303 Prediabetes: Secondary | ICD-10-CM | POA: Diagnosis not present
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
## 2023-12-15 ENCOUNTER — Telehealth: Payer: Self-pay | Admitting: Family Medicine

## 2023-12-15 NOTE — Telephone Encounter (Signed)
 Copied from CRM [id number]. Topic: Appointments - Transfer of Care >> Dec 14, 2023  4:05 PM Joanell B wrote: Pt is requesting to transfer FROM: Dr. Berneta Sayre Pt is requesting to transfer TO: Dr. Garnette Simpler Reason for requested transfer: Would like to be with the family doctor  It is the responsibility of the team the patient would like to transfer to (Dr. Simpler) to reach out to the patient if for any reason this transfer is not acceptable.

## 2023-12-18 NOTE — Telephone Encounter (Signed)
 Med Rec complete, allergies and Pharmacy verified December 18 2023.
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
## 2023-12-21 ENCOUNTER — Telehealth: Payer: Self-pay

## 2023-12-21 ENCOUNTER — Ambulatory Visit: Payer: BC Managed Care – PPO | Attending: Cardiology | Admitting: Cardiology

## 2023-12-21 VITALS — BP 138/98 | HR 78 | Ht 70.0 in | Wt 220.0 lb

## 2023-12-21 DIAGNOSIS — I10 Essential (primary) hypertension: Secondary | ICD-10-CM | POA: Diagnosis not present

## 2023-12-21 DIAGNOSIS — G4733 Obstructive sleep apnea (adult) (pediatric): Secondary | ICD-10-CM

## 2023-12-21 NOTE — Telephone Encounter (Signed)
 Call to patient to set up 3 mo f/u after virtual visit, no answer, left detailed message advising I scheduled her an appt on 03/23/24 at 9 AM, asked patient to call and reschedule if she cannot make this f/u appt.

## 2023-12-28 NOTE — Telephone Encounter (Signed)
 Sent text and lvmtcb to schedule TOC to Dr Veto Kemps.

## 2023-12-30 ENCOUNTER — Encounter: Payer: Self-pay | Admitting: Family Medicine

## 2023-12-30 ENCOUNTER — Ambulatory Visit: Payer: BC Managed Care – PPO | Admitting: Family Medicine

## 2023-12-30 VITALS — BP 150/86 | HR 74 | Temp 99.0°F | Ht 70.0 in | Wt 224.8 lb

## 2023-12-30 DIAGNOSIS — R7303 Prediabetes: Secondary | ICD-10-CM

## 2023-12-30 DIAGNOSIS — I1A0 Resistant hypertension: Secondary | ICD-10-CM | POA: Diagnosis not present

## 2023-12-30 DIAGNOSIS — Z8669 Personal history of other diseases of the nervous system and sense organs: Secondary | ICD-10-CM | POA: Insufficient documentation

## 2023-12-30 DIAGNOSIS — M25511 Pain in right shoulder: Secondary | ICD-10-CM

## 2023-12-30 DIAGNOSIS — Z862 Personal history of diseases of the blood and blood-forming organs and certain disorders involving the immune mechanism: Secondary | ICD-10-CM | POA: Insufficient documentation

## 2023-12-30 DIAGNOSIS — E782 Mixed hyperlipidemia: Secondary | ICD-10-CM
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
## 2023-12-30 NOTE — Assessment & Plan Note (Signed)
 Leslie [name] has stopped taking her rosuvastatin  due to concerns for potential harms form medication. She has an elevated LDL and a CAC score that is elevated. I reinforced the recommendation for statin use to lower CV risk.

## 2023-12-30 NOTE — Assessment & Plan Note (Signed)
 Patient has resistant hypertension, with poor adherence to her medication regimen. She has followed with cardiology for many years. She has had prior normal work-up for secondary causes of hypertension. I recommend she continue valsartan -HCTZ 320-25 mg daily, amlodipine  10 mg daily, and labetalol  200 mg 1/2 tab each morning and 1 tab each evening. She also has hydralazine  25 mg to take as needed for systolic BP > 160. I strongly recommended she take her labetalol  twice a day as prescribed. She should also avoid use of steroids and NSAIDs. It is not clear that Leslie [name] will follow these recommendations.

## 2023-12-30 NOTE — Assessment & Plan Note (Signed)
 Recommend regular exercise to reduce risk of progression to diabetes.

## 2024-01-08 ENCOUNTER — Ambulatory Visit: Payer: BC Managed Care – PPO | Admitting: Family Medicine

## 2024-01-25 ENCOUNTER — Telehealth: Payer: Self-pay | Admitting: Cardiology
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
## 2024-01-25 NOTE — Telephone Encounter (Signed)
 Pt called in asking if she can have some labs drawn. She states she has been very fatigue lately and her legs have been swelling. Please advise.

## 2024-01-26 NOTE — Telephone Encounter (Signed)
Let detailed message on patient voicemail, not sure what is going on with the leg. She has a follow up appointment with dr Jens Som 2/21, ask her to call if she feels she needs to be seen sooner, can schedule with APP.
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
## 2024-02-04 ENCOUNTER — Encounter: Payer: Self-pay | Admitting: Cardiology

## 2024-02-05 ENCOUNTER — Ambulatory Visit: Payer: BC Managed Care – PPO | Attending: Cardiology | Admitting: Cardiology

## 2024-02-05 ENCOUNTER — Encounter: Payer: Self-pay | Admitting: Cardiology

## 2024-02-05 VITALS — BP 180/96 | HR 85 | Ht 70.0 in | Wt 229.6 lb

## 2024-02-05 DIAGNOSIS — R911 Solitary pulmonary nodule: Secondary | ICD-10-CM | POA: Diagnosis not present

## 2024-02-05 DIAGNOSIS — I10 Essential (primary) hypertension: Secondary | ICD-10-CM

## 2024-02-05 DIAGNOSIS — E785 Hyperlipidemia, unspecified: Secondary | ICD-10-CM

## 2024-02-05 DIAGNOSIS — I2510 Atherosclerotic heart disease of native coronary artery without angina pectoris: Secondary | ICD-10-CM

## 2024-02-05 DIAGNOSIS — Z79899 Other long term (current) drug therapy: Secondary | ICD-10-CM

## 2024-02-05 MED ORDER — AMLODIPINE BESYLATE 10 MG PO TABS: 10.0000 mg | ORAL_TABLET | Freq: Every day | ORAL | 3 refills | Status: DC

## 2024-02-05 MED ORDER — LABETALOL HCL 200 MG PO TABS: 200.0000 mg | ORAL_TABLET | Freq: Two times a day (BID) | ORAL | 3 refills | Status: DC

## 2024-02-05 MED ORDER — ROSUVASTATIN CALCIUM 5 MG PO TABS: 5.0000 mg | ORAL_TABLET | Freq: Every day | ORAL | 3 refills | Status: DC

## 2024-02-05 NOTE — Addendum Note (Signed)
Addended by: Bernita Buffy on: 02/05/2024 10:36 AM   Modules accepted: Orders

## 2024-02-05 NOTE — Patient Instructions (Addendum)
Medication Instructions:  Your physician has recommended you make the following change in your medication:   -Increase Labetolol (normodyne) to 200mg  twice daily.  -Restart rosuvastatin (crestor) 5mg  daily in the evening.  *If you need a refill on your cardiac medications before your next appointment, please call your pharmacy*   Testing/Procedures: Non-Cardiac CT scanning of Chest, is a noninvasive, special x-ray that produces cross-sectional images of the body using x-rays and a computer. CT scans help physicians diagnose and treat medical conditions. CT scans provide greater clarity and reveal more details than regular x-ray exams.    Follow-Up: At [facility], you and your health needs are our priority.  As part of our continuing mission to provide you with exceptional heart care, we have created designated Provider Care Teams.  These Care Teams include your primary Cardiologist (physician) and Advanced Practice Providers (APPs -  Physician Assistants and Nurse Practitioners) who all work together to provide you with the care you need, when you need it.  We recommend signing up for the patient portal called "MyChart".  Sign up information is provided on this After Visit Summary.  MyChart is used to connect with patients for Virtual Visits (Telemedicine).  Patients are able to view lab/test results, encounter notes, upcoming appointments, etc.  Non-urgent messages can be sent to your provider as well.   To learn more about what you can do with MyChart, go to [email or web address].    Your next appointment:   12 month(s)  Provider:   Dr. Jens Som   Other Instructions    1st Floor: - Lobby - Registration  - Pharmacy  - Lab - Cafe  2nd Floor: - PV Lab - Diagnostic Testing (echo, CT, nuclear med)  3rd Floor: - Vacant  4th Floor: - TCTS (cardiothoracic surgery) - AFib Clinic - Structural Heart Clinic - Vascular Surgery  - Vascular Ultrasound  5th  Floor: - HeartCare Cardiology (general and EP) - Clinical Pharmacy for coumadin, hypertension, lipid, weight-loss medications, and med management appointments    Valet parking services will be available as well.

## 2024-02-05 NOTE — Telephone Encounter (Signed)
Per chart review patient presented to OV   No nursing outreach needed at this time

## 2024-02-08 ENCOUNTER — Encounter: Payer: Self-pay | Admitting: Cardiology

## 2024-02-15 ENCOUNTER — Telehealth: Admitting: Physician Assistant

## 2024-02-15 DIAGNOSIS — J020 Streptococcal pharyngitis: Secondary | ICD-10-CM

## 2024-02-15 MED ORDER — AMOXICILLIN 500 MG PO CAPS: 500.0000 mg | ORAL_CAPSULE | Freq: Two times a day (BID) | ORAL | 0 refills | Status: AC

## 2024-02-15 NOTE — Progress Notes (Signed)
 Virtual Visit Consent   BIJAL SIGLIN, you are scheduled for a virtual visit with a [facility] provider today. Just as with appointments in the office, your consent must be obtained to participate. Your consent will be active for this visit and any virtual visit you may have with one of our providers in the next 365 days. If you have a MyChart account, a copy of this consent can be sent to you electronically.  As this is a virtual visit, video technology does not allow for your provider to perform a traditional examination. This may limit your provider's ability to fully assess your condition. If your provider identifies any concerns that need to be evaluated in person or the need to arrange testing (such as labs, EKG, etc.), we will make arrangements to do so. Although advances in technology are sophisticated, we cannot ensure that it will always work on either your end or our end. If the connection with a video visit is poor, the visit may have to be switched to a telephone visit. With either a video or telephone visit, we are not always able to ensure that we have a secure connection.  By engaging in this virtual visit, you consent to the provision of healthcare and authorize for your insurance to be billed (if applicable) for the services provided during this visit. Depending on your insurance coverage, you may receive a charge related to this service.  I need to obtain your verbal consent now. Are you willing to proceed with your visit today? Leslie [name] has provided verbal consent on 02/15/2024 for a virtual visit (video or telephone). Margaretann Loveless, PA-C  Date: 02/15/2024 7:55 AM   Virtual Visit via Video Note   I, Margaretann Loveless, connected with  Leslie [name]  ([id number], 1976/01/28) on 02/15/24 at  7:45 AM EST by a video-enabled telemedicine application and verified that I am speaking with the correct person using two identifiers.  Location: Patient: Virtual Visit Location  Patient: Home Provider: Virtual Visit Location Provider: Home Office   I discussed the limitations of evaluation and management by telemedicine and the availability of in person appointments. The patient expressed understanding and agreed to proceed.    History of Present Illness: Leslie [name] is a 48 y.o. who identifies as a female who was assigned female at birth, and is being seen today for possible strep throat.  HPI: Sore Throat  This is a new problem. The current episode started in the past 7 days (Symptoms started Friday, 02/12/24). The problem has been gradually worsening. There has been no fever. The pain is mild. Associated symptoms include congestion, coughing, swollen glands and trouble swallowing. Pertinent negatives include no ear discharge, headaches, plugged ear sensation or shortness of breath. She has had exposure to strep. Treatments tried: elderberry, echinacea/hoey/lemon tea. The treatment provided no relief.     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   Sickle cell trait (HCC) 11/04/2018   Hyperlipidemia 04/30/2018   DDD (degenerative disc disease), lumbar 08/06/2016   Cardiomegaly 05/03/2014   Edema 04/26/2014   Iron deficiency anemia 04/06/2014   Carotid artery disease (HCC) 04/06/2014   Anxiety 07/29/2012  Other abnormal glucose 11/09/2007   Resistant hypertension 08/31/2007    Allergies:  Allergies  Allergen Reactions   Amlodipine     edema   Bee Venom    Bystolic [Nebivolol Hcl]     Stomach cramping    Lisinopril Rash    REACTION: Rash   Medications:  Current Outpatient Medications:    albuterol (VENTOLIN HFA) 108 (90  Base) MCG/ACT inhaler, INHALE 1-2 PUFFS BY MOUTH EVERY 6 HOURS AS NEEDED FOR WHEEZE OR SHORTNESS OF BREATH, Disp: 1 each, Rfl: 0   amLODipine (NORVASC) 10 MG tablet, Take 1 tablet (10 mg total) by mouth daily., Disp: 90 tablet, Rfl: 3   amoxicillin (AMOXIL) 500 MG capsule, Take 1 capsule (500 mg total) by mouth 2 (two) times daily for 10 days., Disp: 20 capsule, Rfl: 0   Ascorbic Acid (VITAMIN C) 1000 MG tablet, Take 1,000 mg by mouth daily., Disp: , Rfl:    aspirin EC 81 MG tablet, Take 81 mg by mouth every 6 (six) hours as needed. Swallow whole., Disp: , Rfl:    Cholecalciferol (VITAMIN D3) 125 MCG (5000 UT) CAPS, Take 1 capsule by mouth daily., Disp: , Rfl:    clotrimazole-betamethasone (LOTRISONE) cream, Apply 1 Application topically 2 (two) times daily., Disp: 30 g, Rfl: 3   ferrous sulfate 325 (65 FE) MG tablet, Take 1 tablet (325 mg total) by mouth daily with breakfast., Disp: 270 tablet, Rfl: 1   hydrALAZINE (APRESOLINE) 25 MG tablet, Take 1 tablet by mouth twice a day if systolic blood pressure is greater than 160 (Patient not taking: Reported on 02/05/2024), Disp: 60 tablet, Rfl: 2   labetalol (NORMODYNE) 200 MG tablet, Take 1 tablet (200 mg total) by mouth 2 (two) times daily., Disp: 180 tablet, Rfl: 3   Loratadine (CLARITIN PO), Take by mouth daily., Disp: , Rfl:    Melatonin 5 MG CHEW, Chew 5 mg by mouth., Disp: , Rfl:    Multiple Vitamins-Minerals (AIRBORNE PO), Take by mouth., Disp: , Rfl:    Omega 3-6-9 Fatty Acids (OMEGA 3-6-9 COMPLEX) CAPS, Take 1 capsule by mouth daily., Disp: , Rfl:    PARoxetine (PAXIL CR) 12.5 MG 24 hr tablet, Take 1 tablet (12.5 mg total) by mouth daily., Disp: 90 tablet, Rfl: 1   potassium chloride SA (KLOR-CON M) 20 MEQ tablet, Take 1 tablet (20 mEq total) by mouth 2 (two) times daily., Disp: 180 tablet, Rfl: 3   rosuvastatin (CRESTOR) 5 MG tablet, Take 1 tablet (5 mg total) by mouth daily., Disp: 90 tablet, Rfl: 3   valsartan-hydrochlorothiazide (DIOVAN-HCT)  320-25 MG tablet, TAKE 1 TABLET DAILY, Disp: 90 tablet, Rfl: 3  Observations/Objective: Patient is well-developed, well-nourished in no acute distress.  Resting comfortably at home.  Head is normocephalic, atraumatic.  No labored breathing.  Speech is clear and coherent with logical content.  Patient is alert and oriented at baseline.    Assessment and Plan: 1. Strep pharyngitis (Primary) - amoxicillin (AMOXIL) 500 MG capsule; Take 1 capsule (500 mg total) by mouth 2 (two) times daily for 10 days.  Dispense: 20 capsule; Refill: 0  - Suspect strep throat - Amoxicillin prescribed - Tylenol and Ibuprofen alternating every 4 hours - Salt water gargles - Chloraseptic spray - Liquid and soft food diet - Push fluids - New toothbrush in 3 days - Seek in person evaluation if not improving or if symptoms worsen   Follow Up Instructions: I discussed the assessment and treatment plan with the patient. The patient was  provided an opportunity to ask questions and all were answered. The patient agreed with the plan and demonstrated an understanding of the instructions.  A copy of instructions were sent to the patient via MyChart unless otherwise noted below.    The patient was advised to call back or seek an in-person evaluation if the symptoms worsen or if the condition fails to improve as anticipated.    Margaretann Loveless, PA-C

## 2024-02-15 NOTE — Patient Instructions (Signed)
 [name], thank you for joining Margaretann Loveless, PA-C for today's virtual visit.  While this provider is not your primary care provider (PCP), if your PCP is located in our provider database this encounter information will be shared with them immediately following your visit.   A [facility] MyChart account gives you access to today's visit and all your visits, tests, and labs performed at [facility] " click here if you don't have a [facility] MyChart account or go to [email or web address]  Consent: (Patient) [name] provided verbal consent for this virtual visit at the beginning of the encounter.  Current Medications:  Current Outpatient Medications:    amoxicillin (AMOXIL) 500 MG capsule, Take 1 capsule (500 mg total) by mouth 2 (two) times daily for 10 days., Disp: 20 capsule, Rfl: 0   albuterol (VENTOLIN HFA) 108 (90 Base) MCG/ACT inhaler, INHALE 1-2 PUFFS BY MOUTH EVERY 6 HOURS AS NEEDED FOR WHEEZE OR SHORTNESS OF BREATH, Disp: 1 each, Rfl: 0   amLODipine (NORVASC) 10 MG tablet, Take 1 tablet (10 mg total) by mouth daily., Disp: 90 tablet, Rfl: 3   Ascorbic Acid (VITAMIN C) 1000 MG tablet, Take 1,000 mg by mouth daily., Disp: , Rfl:    aspirin EC 81 MG tablet, Take 81 mg by mouth every 6 (six) hours as needed. Swallow whole., Disp: , Rfl:    Cholecalciferol (VITAMIN D3) 125 MCG (5000 UT) CAPS, Take 1 capsule by mouth daily., Disp: , Rfl:    clotrimazole-betamethasone (LOTRISONE) cream, Apply 1 Application topically 2 (two) times daily., Disp: 30 g, Rfl: 3   ferrous sulfate 325 (65 FE) MG tablet, Take 1 tablet (325 mg total) by mouth daily with breakfast., Disp: 270 tablet, Rfl: 1   hydrALAZINE (APRESOLINE) 25 MG tablet, Take 1 tablet by mouth twice a day if systolic blood pressure is greater than 160 (Patient not taking: Reported on 02/05/2024), Disp: 60 tablet, Rfl: 2   labetalol (NORMODYNE) 200 MG tablet, Take 1 tablet (200 mg total) by mouth 2 (two)  times daily., Disp: 180 tablet, Rfl: 3   Loratadine (CLARITIN PO), Take by mouth daily., Disp: , Rfl:    Melatonin 5 MG CHEW, Chew 5 mg by mouth., Disp: , Rfl:    Multiple Vitamins-Minerals (AIRBORNE PO), Take by mouth., Disp: , Rfl:    Omega 3-6-9 Fatty Acids (OMEGA 3-6-9 COMPLEX) CAPS, Take 1 capsule by mouth daily., Disp: , Rfl:    PARoxetine (PAXIL CR) 12.5 MG 24 hr tablet, Take 1 tablet (12.5 mg total) by mouth daily., Disp: 90 tablet, Rfl: 1   potassium chloride SA (KLOR-CON M) 20 MEQ tablet, Take 1 tablet (20 mEq total) by mouth 2 (two) times daily., Disp: 180 tablet, Rfl: 3   rosuvastatin (CRESTOR) 5 MG tablet, Take 1 tablet (5 mg total) by mouth daily., Disp: 90 tablet, Rfl: 3   valsartan-hydrochlorothiazide (DIOVAN-HCT) 320-25 MG tablet, TAKE 1 TABLET DAILY, Disp: 90 tablet, Rfl: 3   Medications ordered in this encounter:  Meds ordered this encounter  Medications   amoxicillin (AMOXIL) 500 MG capsule    Sig: Take 1 capsule (500 mg total) by mouth 2 (two) times daily for 10 days.    Dispense:  20 capsule    Refill:  0    Supervising Provider:   Merrilee Jansky [[id number]]     *If you need refills on other medications prior to your next appointment, please contact your pharmacy*  Follow-Up: Call back or seek an in-person evaluation  if the symptoms worsen or if the condition fails to improve as anticipated.  [facility] [phone]  Other Instructions  Strep Throat, Adult Strep throat is an infection in the throat that is caused by bacteria. It is common during the cold months of the year. It mostly affects children who are 2-80 years old. However, people of all ages can get it at any time of the year. This infection spreads from person to person (is contagious) through coughing, sneezing, or having close contact. Your health care provider may use other names to describe the infection. When strep throat affects the tonsils, it is called tonsillitis. When it  affects the back of the throat, it is called pharyngitis. What are the causes? This condition is caused by the Streptococcus pyogenes bacteria. What increases the risk? You are more likely to develop this condition if: You care for school-age children, or are around school-age children. Children are more likely to get strep throat and may spread it to others. You spend time in crowded places where the infection can spread easily. You have close contact with someone who has strep throat. What are the signs or symptoms? Symptoms of this condition include: Fever or chills. Redness, swelling, or pain in the tonsils or throat. Pain or difficulty when swallowing. White or yellow spots on the tonsils or throat. Tender glands in the neck and under the jaw. Bad smelling breath. Red rash all over the body. This is rare. How is this diagnosed? This condition is diagnosed by tests that check for the presence and the amount of bacteria that cause strep throat. They are: Rapid strep test. Your throat is swabbed and checked for the presence of bacteria. Results are usually ready in minutes. Throat culture test. Your throat is swabbed. The sample is placed in a cup that allows infections to grow. Results are usually ready in 1 or 2 days. How is this treated? This condition may be treated with: Medicines that kill germs (antibiotics). Medicines that relieve pain or fever. These include: Ibuprofen or acetaminophen. Aspirin, only for people who are over the age of 3. Throat lozenges. Throat sprays. Follow these instructions at home: Medicines  Take over-the-counter and prescription medicines only as told by your health care provider. Take your antibiotic medicine as told by your health care provider. Do not stop taking the antibiotic even if you start to feel better. Eating and drinking  If you have trouble swallowing, try eating soft foods until your sore throat feels better. Drink enough fluid  to keep your urine pale yellow. To help relieve pain, you may have: Warm fluids, such as soup and tea. Cold fluids, such as frozen desserts or popsicles. General instructions Gargle with a salt-water mixture 3-4 times a day or as needed. To make a salt-water mixture, completely dissolve -1 tsp (3-6 g) of salt in 1 cup (237 mL) of warm water. Get plenty of rest. Stay home from work or school until you have been taking antibiotics for 24 hours. Do not use any products that contain nicotine or tobacco. These products include cigarettes, chewing tobacco, and vaping devices, such as e-cigarettes. If you need help quitting, ask your health care provider. It is up to you to get your test results. Ask your health care provider, or the department that is doing the test, when your results will be ready. Keep all follow-up visits. This is important. How is this prevented?  Do not share food, drinking cups, or personal items  that could cause the infection to spread to other people. Wash your hands often with soap and water for at least 20 seconds. If soap and water are not available, use hand sanitizer. Make sure that all people in your house wash their hands well. Have family members tested if they have a sore throat or fever. They may need an antibiotic if they have strep throat. Contact a health care provider if: You have swelling in your neck that keeps getting bigger. You develop a rash, cough, or earache. You cough up a thick mucus that is green, yellow-brown, or bloody. You have pain or discomfort that does not get better with medicine. Your symptoms seem to be getting worse. You have a fever. Get help right away if: You have new symptoms, such as vomiting, severe headache, stiff or painful neck, chest pain, or shortness of breath. You have severe throat pain, drooling, or changes in your voice. You have swelling of the neck, or the skin on the neck becomes red and tender. You have signs of  dehydration, such as tiredness (fatigue), dry mouth, and decreased urination. You become increasingly sleepy, or you cannot wake up completely. Your joints become red or painful. These symptoms may represent a serious problem that is an emergency. Do not wait to see if the symptoms will go away. Get medical help right away. Call your local emergency services (911 in the [place] Do not drive yourself to the hospital. Summary Strep throat is an infection in the throat that is caused by the Streptococcus pyogenes bacteria. This infection is spread from person to person (is contagious) through coughing, sneezing, or having close contact. Take your medicines, including antibiotics, as told by your health care provider. Do not stop taking the antibiotic even if you start to feel better. To prevent the spread of germs, wash your hands well with soap and water. Have others do the same. Do not share food, drinking cups, or personal items. Get help right away if you have new symptoms, such as vomiting, severe headache, stiff or painful neck, chest pain, or shortness of breath. This information is not intended to replace advice given to you by your health care provider. Make sure you discuss any questions you have with your health care provider. Document Revised: 03/26/2021 Document Reviewed: 03/26/2021 Elsevier Patient Education  2024 [employer]   If you have been instructed to have an in-person evaluation today at a local Urgent Care facility, please use the link below. It will take you to a list of all of our available [facility], including address, phone number and hours of operation. Please do not delay care.  [facility]  If you or a family member do not have a primary care provider, use the link below to schedule a visit and establish care. When you choose a [facility] primary care physician or advanced practice provider, you gain a long-term partner in health. Find a  Primary Care Provider  Learn more about [facility]'s in-office and virtual care options: [facility] - Get Care Now

## 2024-02-25 ENCOUNTER — Ambulatory Visit (INDEPENDENT_AMBULATORY_CARE_PROVIDER_SITE_OTHER): Payer: BC Managed Care – PPO | Admitting: Family

## 2024-02-25 ENCOUNTER — Encounter (HOSPITAL_BASED_OUTPATIENT_CLINIC_OR_DEPARTMENT_OTHER): Payer: Self-pay

## 2024-02-25 ENCOUNTER — Encounter (HOSPITAL_BASED_OUTPATIENT_CLINIC_OR_DEPARTMENT_OTHER): Payer: Self-pay | Admitting: Family

## 2024-02-25 VITALS — BP 158/100 | HR 78 | Ht 70.0 in | Wt 230.0 lb

## 2024-02-25 DIAGNOSIS — I25118 Atherosclerotic heart disease of native coronary artery with other forms of angina pectoris: Secondary | ICD-10-CM

## 2024-02-25 DIAGNOSIS — I1A0 Resistant hypertension: Secondary | ICD-10-CM | POA: Diagnosis not present

## 2024-02-25 DIAGNOSIS — G4733 Obstructive sleep apnea (adult) (pediatric): Secondary | ICD-10-CM | POA: Diagnosis not present

## 2024-02-25 DIAGNOSIS — E785 Hyperlipidemia, unspecified: Secondary | ICD-10-CM

## 2024-02-25 DIAGNOSIS — I6523 Occlusion and stenosis of bilateral carotid arteries: Secondary | ICD-10-CM | POA: Diagnosis not present

## 2024-02-25 MED ORDER — DOXAZOSIN MESYLATE 2 MG PO TABS: 2.0000 mg | ORAL_TABLET | Freq: Every day | ORAL | 3 refills | Status: DC

## 2024-02-25 NOTE — Patient Instructions (Signed)
 Medication Instructions:  Your physician has recommended you make the following change in your medication:   Continue current medications   Add Doxazosin 2mg  daily in the evening   Labwork: Your physician recommends that you return for lab work today- [facility]- Aldosterone, Metanephrines & Catecholamines    Testing/Procedures: Your physician has requested that you have a carotid duplex. This test is an ultrasound of the carotid arteries in your neck. It looks at blood flow through these arteries that supply the brain with blood. Allow one hour for this exam. There are no restrictions or special instructions.    Follow-Up: 3 months in [facility] with Dr. Duke Salvia, Gillian Shields, NP or [facility]
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
## 2024-02-26 ENCOUNTER — Ambulatory Visit: Admitting: Family Medicine

## 2024-02-26 ENCOUNTER — Telehealth: Admitting: Family Medicine

## 2024-02-26 DIAGNOSIS — J111 Influenza due to unidentified influenza virus with other respiratory manifestations: Secondary | ICD-10-CM

## 2024-02-26 MED ORDER — OSELTAMIVIR PHOSPHATE 75 MG PO CAPS: 75.0000 mg | ORAL_CAPSULE | Freq: Two times a day (BID) | ORAL | 0 refills | Status: AC

## 2024-02-26 MED ORDER — BENZONATATE 200 MG PO CAPS: 200.0000 mg | ORAL_CAPSULE | Freq: Two times a day (BID) | ORAL | 0 refills | Status: AC | PRN

## 2024-02-26 NOTE — Patient Instructions (Signed)

## 2024-02-26 NOTE — Progress Notes (Signed)
 Virtual Visit Consent   Leslie [name], you are scheduled for a virtual visit with a [facility] provider today. Just as with appointments in the office, your consent must be obtained to participate. Your consent will be active for this visit and any virtual visit you may have with one of our providers in the next 365 days. If you have a MyChart account, a copy of this consent can be sent to you electronically.  As this is a virtual visit, video technology does not allow for your provider to perform a traditional examination. This may limit your provider's ability to fully assess your condition. If your provider identifies any concerns that need to be evaluated in person or the need to arrange testing (such as labs, EKG, etc.), we will make arrangements to do so. Although advances in technology are sophisticated, we cannot ensure that it will always work on either your end or our end. If the connection with a video visit is poor, the visit may have to be switched to a telephone visit. With either a video or telephone visit, we are not always able to ensure that we have a secure connection.  By engaging in this virtual visit, you consent to the provision of healthcare and authorize for your insurance to be billed (if applicable) for the services provided during this visit. Depending on your insurance coverage, you may receive a charge related to this service.  I need to obtain your verbal consent now. Are you willing to proceed with your visit today? Leslie [name] has provided verbal consent on 02/26/2024 for a virtual visit (video or telephone). Georgana Curio, FNP  Date: 02/26/2024 2:36 PM   Virtual Visit via Video Note   I, Georgana Curio, connected with  Leslie [name]  ([id number], 1976/06/09) on 02/26/24 at  2:30 PM EDT by a video-enabled telemedicine application and verified that I am speaking with the correct person using two identifiers.  Location: Patient: Virtual Visit Location Patient:  Home Provider: Virtual Visit Location Provider: Home Office   I discussed the limitations of evaluation and management by telemedicine and the availability of in person appointments. The patient expressed understanding and agreed to proceed.    History of Present Illness: Leslie [name] is a 48 y.o. who identifies as a female who was assigned female at birth, and is being seen today for flu exposure, fever, body aches and chills, already on amoxil. Cough also. No wheezing or sob.   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   Sickle cell trait (HCC) 11/04/2018   Hyperlipidemia 04/30/2018   DDD (degenerative disc disease), lumbar 08/06/2016   Cardiomegaly 05/03/2014   Edema 04/26/2014   Iron deficiency anemia 04/06/2014   Carotid artery disease (HCC) 04/06/2014   Anxiety 07/29/2012   Other abnormal glucose 11/09/2007   Resistant hypertension 08/31/2007    Allergies:  Allergies  Allergen Reactions   Amlodipine     edema   Bee Venom    Bystolic [Nebivolol Hcl]     Stomach cramping    Lisinopril Rash    REACTION: Rash   Medications:  Current Outpatient Medications:    albuterol (VENTOLIN  HFA) 108 (90 Base) MCG/ACT inhaler, INHALE 1-2 PUFFS BY MOUTH EVERY 6 HOURS AS NEEDED FOR WHEEZE OR SHORTNESS OF BREATH, Disp: 1 each, Rfl: 0   amLODipine (NORVASC) 10 MG tablet, Take 1 tablet (10 mg total) by mouth daily., Disp: 90 tablet, Rfl: 3   Ascorbic Acid (VITAMIN C) 1000 MG tablet, Take 1,000 mg by mouth daily., Disp: , Rfl:    aspirin EC 81 MG tablet, Take 81 mg by mouth every 6 (six) hours as needed. Swallow whole., Disp: , Rfl:    Cholecalciferol  (VITAMIN D3) 125 MCG (5000 UT) CAPS, Take 1 capsule by mouth daily., Disp: , Rfl:    clotrimazole-betamethasone (LOTRISONE) cream, Apply 1 Application topically 2 (two) times daily., Disp: 30 g, Rfl: 3   doxazosin (CARDURA) 2 MG tablet, Take 1 tablet (2 mg total) by mouth daily., Disp: 90 tablet, Rfl: 3   ferrous sulfate 325 (65 FE) MG tablet, Take 1 tablet (325 mg total) by mouth daily with breakfast., Disp: 270 tablet, Rfl: 1   hydrALAZINE (APRESOLINE) 25 MG tablet, Take 1 tablet by mouth twice a day if systolic blood pressure is greater than 160, Disp: 60 tablet, Rfl: 2   labetalol (NORMODYNE) 200 MG tablet, Take 1 tablet (200 mg total) by mouth 2 (two) times daily., Disp: 180 tablet, Rfl: 3   Loratadine (CLARITIN PO), Take by mouth daily., Disp: , Rfl:    Melatonin 5 MG CHEW, Chew 5 mg by mouth., Disp: , Rfl:    Multiple Vitamins-Minerals (AIRBORNE PO), Take by mouth., Disp: , Rfl:    Omega 3-6-9 Fatty Acids (OMEGA 3-6-9 COMPLEX) CAPS, Take 1 capsule by mouth daily., Disp: , Rfl:    PARoxetine (PAXIL CR) 12.5 MG 24 hr tablet, Take 1 tablet (12.5 mg total) by mouth daily., Disp: 90 tablet, Rfl: 1   potassium chloride SA (KLOR-CON M) 20 MEQ tablet, Take 1 tablet (20 mEq total) by mouth 2 (two) times daily., Disp: 180 tablet, Rfl: 3   rosuvastatin (CRESTOR) 5 MG tablet, Take 1 tablet (5 mg total) by mouth daily. (Patient taking differently: Take 2.5 mg by mouth daily. Pt takes half a tablet daily.), Disp: 90 tablet, Rfl: 3   valsartan-hydrochlorothiazide (DIOVAN-HCT) 320-25 MG tablet, TAKE 1 TABLET DAILY, Disp: 90 tablet, Rfl: 3  Observations/Objective: Patient is well-developed, well-nourished in no acute distress.  Resting comfortably  at home.  Head is normocephalic, atraumatic.  No labored breathing.  Speech is clear and coherent with logical content.  Patient is alert and oriented at baseline.    Assessment and Plan: 1. Influenza-like illness (Primary)  Increase fluids, humidifier at  night, tylenol or ibuprofen as directed, UC if sx worsen.   Follow Up Instructions: I discussed the assessment and treatment plan with the patient. The patient was provided an opportunity to ask questions and all were answered. The patient agreed with the plan and demonstrated an understanding of the instructions.  A copy of instructions were sent to the patient via MyChart unless otherwise noted below.     The patient was advised to call back or seek an in-person evaluation if the symptoms worsen or if the condition fails to improve as anticipated.    Georgana Curio, FNP

## 2024-02-29 ENCOUNTER — Ambulatory Visit (INDEPENDENT_AMBULATORY_CARE_PROVIDER_SITE_OTHER): Admitting: Family Medicine

## 2024-02-29 ENCOUNTER — Encounter: Payer: Self-pay | Admitting: Family Medicine

## 2024-02-29 VITALS — BP 138/86 | HR 82 | Temp 97.6°F | Ht 70.0 in | Wt 229.4 lb

## 2024-02-29 DIAGNOSIS — J069 Acute upper respiratory infection, unspecified: Secondary | ICD-10-CM

## 2024-02-29 DIAGNOSIS — I1A0 Resistant hypertension: Secondary | ICD-10-CM | POA: Diagnosis not present

## 2024-02-29 NOTE — Assessment & Plan Note (Signed)
 I do not see any sign of illness beyond a viral URI. Discussed home care for viral illness, including rest, pushing fluids, and OTC medications as needed for symptom relief. Recommend hot tea with honey for sore throat symptoms. Follow-up if needed for worsening or persistent symptoms.
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
## 2024-03-01 ENCOUNTER — Other Ambulatory Visit: Payer: Self-pay | Admitting: Cardiology

## 2024-03-07 ENCOUNTER — Ambulatory Visit (HOSPITAL_BASED_OUTPATIENT_CLINIC_OR_DEPARTMENT_OTHER): Payer: BC Managed Care – PPO

## 2024-03-07 LAB — CATECHOLAMINES, FRACTIONATED, PLASMA
Dopamine: <30 pg/mL (ref 0–48)
Epinephrine: 62 pg/mL (ref 0–62)
Norepinephrine: 1042 pg/mL — ABNORMAL HIGH (ref 0–874)

## 2024-03-07 LAB — METANEPHRINES, PLASMA
Metanephrine, Free: <25 pg/mL (ref 0.0–88.0)
Normetanephrine, Free: 157.5 pg/mL (ref 0.0–218.9)

## 2024-03-07 LAB — ALDOSTERONE + RENIN ACTIVITY W/ RATIO
Aldos/Renin Ratio: 42.2 — ABNORMAL HIGH (ref 0.0–30.0)
Aldosterone: 10 ng/dL (ref 0.0–30.0)
Renin Activity, Plasma: 0.237 ng/mL/h (ref 0.167–5.380)

## 2024-03-09 ENCOUNTER — Encounter (HOSPITAL_BASED_OUTPATIENT_CLINIC_OR_DEPARTMENT_OTHER): Payer: Self-pay

## 2024-03-13 ENCOUNTER — Encounter: Payer: Self-pay | Admitting: Family Medicine

## 2024-03-14 ENCOUNTER — Ambulatory Visit (HOSPITAL_BASED_OUTPATIENT_CLINIC_OR_DEPARTMENT_OTHER)
Admission: RE | Admit: 2024-03-14 | Discharge: 2024-03-14 | Disposition: A | Discharge status: Home / Self Care | Source: Ambulatory Visit | Attending: Cardiology | Admitting: Cardiology

## 2024-03-14 DIAGNOSIS — R918 Other nonspecific abnormal finding of lung field: Secondary | ICD-10-CM | POA: Diagnosis not present

## 2024-03-14 DIAGNOSIS — R911 Solitary pulmonary nodule: Secondary | ICD-10-CM | POA: Insufficient documentation

## 2024-03-22 ENCOUNTER — Encounter (HOSPITAL_BASED_OUTPATIENT_CLINIC_OR_DEPARTMENT_OTHER)

## 2024-03-22 ENCOUNTER — Encounter: Payer: Self-pay | Admitting: Cardiology

## 2024-03-23 ENCOUNTER — Ambulatory Visit: Payer: BC Managed Care – PPO | Admitting: Cardiology

## 2024-03-28 ENCOUNTER — Encounter: Payer: Self-pay | Admitting: Cardiology

## 2024-03-29 ENCOUNTER — Encounter: Payer: Self-pay | Admitting: Family Medicine

## 2024-03-29 ENCOUNTER — Ambulatory Visit (INDEPENDENT_AMBULATORY_CARE_PROVIDER_SITE_OTHER): Payer: BC Managed Care – PPO | Admitting: Family Medicine

## 2024-03-29 VITALS — BP 146/92 | HR 77 | Temp 98.0°F | Ht 70.0 in | Wt 229.4 lb

## 2024-03-29 DIAGNOSIS — I1A0 Resistant hypertension: Secondary | ICD-10-CM | POA: Diagnosis not present

## 2024-03-29 DIAGNOSIS — G4733 Obstructive sleep apnea (adult) (pediatric): Secondary | ICD-10-CM | POA: Diagnosis not present

## 2024-03-29 DIAGNOSIS — M25562 Pain in left knee: Secondary | ICD-10-CM

## 2024-03-29 DIAGNOSIS — M25511 Pain in right shoulder: Secondary | ICD-10-CM

## 2024-03-29 DIAGNOSIS — Z23 Encounter for immunization: Secondary | ICD-10-CM | POA: Diagnosis not present

## 2024-03-29 NOTE — Assessment & Plan Note (Addendum)
 She notes the facemask did not fit well. She does feel her nasal unit is working better, but admits she does not use this every night. I suspect some of her issues with daytime somnolence is more related to inconsistent use of there CPAP. I encouraged her to try and use this more consistently to see if her daytime sleepiness improves.

## 2024-03-29 NOTE — Assessment & Plan Note (Signed)
 Blood pressure is lower than her past 2 visits. Adherence to her regimen has been an issue for Leslie [name]. I encouraged her to try and stick to this. She should continue valsartan-HCTZ 320-25 mg daily, amlodipine 10 mg daily, and labetalol 200 mg 1/2 tab each morning and 1 tab each evening. She also has hydralazine 25 mg to take as needed for systolic BP > 160. I strongly recommended she take her labetalol twice a day as prescribed. Recent labs show some elevation of norepinephrine and an elevated aldosterone/renin ratio. [facility] has recommended 24 hr urine for catecholamines and metanephrines and a sodium loading followed by 24 hr urine aldosteron, sodium, creatinine and BMET. Leslie [name] should follow-up with them regarding this testing.
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
## 2024-04-14 ENCOUNTER — Encounter (HOSPITAL_BASED_OUTPATIENT_CLINIC_OR_DEPARTMENT_OTHER)

## 2024-04-14 DIAGNOSIS — I6523 Occlusion and stenosis of bilateral carotid arteries: Secondary | ICD-10-CM | POA: Diagnosis not present

## 2024-04-15 ENCOUNTER — Encounter (HOSPITAL_BASED_OUTPATIENT_CLINIC_OR_DEPARTMENT_OTHER): Payer: Self-pay

## 2024-05-01 ENCOUNTER — Encounter: Payer: Self-pay | Admitting: Cardiology

## 2024-05-03 ENCOUNTER — Telehealth: Admitting: Cardiology

## 2024-05-03 ENCOUNTER — Ambulatory Visit: Attending: Cardiology | Admitting: Cardiology

## 2024-05-03 VITALS — BP 141/99 | HR 81 | Ht 70.0 in | Wt 230.0 lb

## 2024-05-03 DIAGNOSIS — G4733 Obstructive sleep apnea (adult) (pediatric): Secondary | ICD-10-CM

## 2024-05-03 DIAGNOSIS — I10 Essential (primary) hypertension: Secondary | ICD-10-CM
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
## 2024-05-03 NOTE — Patient Instructions (Signed)

## 2024-05-04 ENCOUNTER — Encounter: Payer: Self-pay | Admitting: *Deleted

## 2024-05-04 ENCOUNTER — Encounter: Payer: Self-pay | Admitting: Family Medicine

## 2024-05-04 DIAGNOSIS — D508 Other iron deficiency anemias: Secondary | ICD-10-CM

## 2024-05-05 NOTE — Progress Notes (Signed)
 Order for PAP supplies sent to [facility] today.

## 2024-05-05 NOTE — Telephone Encounter (Signed)
 Refill request for  Iron  tabs.    Please review and advise.  Thanks. Dm/cma

## 2024-05-05 NOTE — Addendum Note (Signed)
 Addended by: Maceo Sax on: 05/05/2024 01:13 PM   Modules accepted: Orders

## 2024-05-10 ENCOUNTER — Telehealth

## 2024-05-10 DIAGNOSIS — B9689 Other specified bacterial agents as the cause of diseases classified elsewhere: Secondary | ICD-10-CM | POA: Diagnosis not present

## 2024-05-10 DIAGNOSIS — J069 Acute upper respiratory infection, unspecified: Secondary | ICD-10-CM | POA: Diagnosis not present

## 2024-05-10 MED ORDER — AMOXICILLIN-POT CLAVULANATE 875-125 MG PO TABS: 1.0000 | ORAL_TABLET | Freq: Two times a day (BID) | ORAL | 0 refills | Status: DC

## 2024-05-10 MED ORDER — BENZONATATE 100 MG PO CAPS: 100.0000 mg | ORAL_CAPSULE | Freq: Three times a day (TID) | ORAL | 0 refills | Status: DC | PRN

## 2024-05-10 NOTE — Progress Notes (Signed)
 Virtual Visit Consent   Leslie [name], you are scheduled for a virtual visit with a [facility] provider today. Just as with appointments in the office, your consent must be obtained to participate. Your consent will be active for this visit and any virtual visit you may have with one of our providers in the next 365 days. If you have a MyChart account, a copy of this consent can be sent to you electronically.  As this is a virtual visit, video technology does not allow for your provider to perform a traditional examination. This may limit your provider's ability to fully assess your condition. If your provider identifies any concerns that need to be evaluated in person or the need to arrange testing (such as labs, EKG, etc.), we will make arrangements to do so. Although advances in technology are sophisticated, we cannot ensure that it will always work on either your end or our end. If the connection with a video visit is poor, the visit may have to be switched to a telephone visit. With either a video or telephone visit, we are not always able to ensure that we have a secure connection.  By engaging in this virtual visit, you consent to the provision of healthcare and authorize for your insurance to be billed (if applicable) for the services provided during this visit. Depending on your insurance coverage, you may receive a charge related to this service.  I need to obtain your verbal consent now. Are you willing to proceed with your visit today? Leslie [name] has provided verbal consent on 05/10/2024 for a virtual visit (video or telephone). Leslie [name], [place]  Date: 05/10/2024 7:50 AM   Virtual Visit via Video Note   I, Leslie [name], connected with  Leslie [name]  ([id number], 1976-09-17) on 05/10/24 at  7:45 AM EDT by a video-enabled telemedicine application and verified that I am speaking with the correct person using two identifiers.  Location: Patient: Virtual Visit  Location Patient: Home Provider: Virtual Visit Location Provider: Home Office   I discussed the limitations of evaluation and management by telemedicine and the availability of in person appointments. The patient expressed understanding and agreed to proceed.    History of Present Illness: Leslie [name] is a 48 y.o. who identifies as a female who was assigned female at birth, and is being seen today for possible strep throat. Patient endorses symptoms starting in the past 5 days with severe sore throat and hoarseness. Notes fever, chills, sweats. Notes some fatigue and nausea. Also noting congestion and cough with thick colored phlegm.   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   Sickle cell trait (HCC) 11/04/2018   Hyperlipidemia 04/30/2018   DDD (degenerative disc disease), lumbar 08/06/2016   Cardiomegaly 05/03/2014   Edema 04/26/2014   Iron  deficiency anemia 04/06/2014   Carotid artery disease (HCC) 04/06/2014   Anxiety 07/29/2012   Other abnormal glucose 11/09/2007   Resistant hypertension 08/31/2007    Allergies:  Allergies  Allergen Reactions   Amlodipine      edema   Bee Venom    Bystolic  [Nebivolol  Hcl]     Stomach cramping  Lisinopril Rash    REACTION: Rash   Medications:  Current Outpatient Medications:    amoxicillin -clavulanate (AUGMENTIN ) 875-125 MG tablet, Take 1 tablet by mouth 2 (two) times daily., Disp: 14 tablet, Rfl: 0   benzonatate  (TESSALON ) 100 MG capsule, Take 1 capsule (100 mg total) by mouth 3 (three) times daily as needed for cough., Disp: 30 capsule, Rfl: 0   albuterol  (VENTOLIN  HFA) 108 (90 Base) MCG/ACT  inhaler, INHALE 1-2 PUFFS BY MOUTH EVERY 6 HOURS AS NEEDED FOR WHEEZE OR SHORTNESS OF BREATH, Disp: 1 each, Rfl: 0   amLODipine  (NORVASC ) 10 MG tablet, Take 1 tablet (10 mg total) by mouth daily., Disp: 90 tablet, Rfl: 3   Ascorbic Acid (VITAMIN C) 1000 MG tablet, Take 1,000 mg by mouth daily., Disp: , Rfl:    aspirin  EC 81 MG tablet, Take 81 mg by mouth every 6 (six) hours as needed. Swallow whole. (Patient not taking: Reported on 05/03/2024), Disp: , Rfl:    Cholecalciferol (VITAMIN D3) 125 MCG (5000 UT) CAPS, Take 1 capsule by mouth daily., Disp: , Rfl:    clotrimazole -betamethasone  (LOTRISONE ) cream, Apply 1 Application topically 2 (two) times daily. (Patient not taking: Reported on 05/03/2024), Disp: 30 g, Rfl: 3   doxazosin  (CARDURA ) 2 MG tablet, Take 1 tablet (2 mg total) by mouth daily., Disp: 90 tablet, Rfl: 3   ferrous sulfate  325 (65 FE) MG tablet, Take 1 tablet (325 mg total) by mouth daily with breakfast., Disp: 270 tablet, Rfl: 1   hydrALAZINE  (APRESOLINE ) 25 MG tablet, Take 1 tablet by mouth twice a day if systolic blood pressure is greater than 160 (Patient not taking: Reported on 05/03/2024), Disp: 60 tablet, Rfl: 2   labetalol  (NORMODYNE ) 200 MG tablet, Take 1 tablet (200 mg total) by mouth 2 (two) times daily., Disp: 180 tablet, Rfl: 3   Loratadine (CLARITIN PO), Take by mouth daily., Disp: , Rfl:    Melatonin 5 MG CHEW, Chew 5 mg by mouth., Disp: , Rfl:    Multiple Vitamins-Minerals (AIRBORNE PO), Take by mouth., Disp: , Rfl:    Omega 3-6-9 Fatty Acids (OMEGA 3-6-9 COMPLEX) CAPS, Take 1 capsule by mouth daily., Disp: , Rfl:    PARoxetine  (PAXIL  CR) 12.5 MG 24 hr tablet, Take 1 tablet (12.5 mg total) by mouth daily., Disp: 90 tablet, Rfl: 1   potassium chloride  SA (KLOR-CON  M) 20 MEQ tablet, Take 1 tablet (20 mEq total) by mouth 2 (two) times daily., Disp: 180 tablet, Rfl: 3   rosuvastatin  (CRESTOR ) 5 MG tablet, Take 1 tablet (5 mg total) by mouth daily. (Patient taking differently:  Take 2.5 mg by mouth daily. Pt takes half a tablet daily.), Disp: 90 tablet, Rfl: 3   valsartan -hydrochlorothiazide  (DIOVAN -HCT) 320-25 MG tablet, TAKE 1 TABLET DAILY, Disp: 90 tablet, Rfl: 3  Observations/Objective: Patient is well-developed, well-nourished in no acute distress.  Resting comfortably at home.  Head is normocephalic, atraumatic.  No labored breathing. Speech is clear and coherent with logical content.  Patient is alert and oriented at baseline.   Assessment and Plan: 1. Bacterial URI (Primary) - benzonatate  (TESSALON ) 100 MG capsule; Take 1 capsule (100 mg total) by mouth 3 (three) times daily as needed for cough.  Dispense: 30 capsule; Refill: 0 - amoxicillin -clavulanate (AUGMENTIN ) 875-125 MG tablet; Take 1 tablet by mouth 2 (two) times daily.  Dispense: 14 tablet; Refill: 0  Rx Augmentin .  Increase fluids.  Rest.  Saline nasal spray.  Probiotic.  Mucinex  as directed.  Humidifier in bedroom. Tessalon   per orders.  Call or return to clinic if symptoms are not improving.   Follow Up Instructions: I discussed the assessment and treatment plan with the patient. The patient was provided an opportunity to ask questions and all were answered. The patient agreed with the plan and demonstrated an understanding of the instructions.  A copy of instructions were sent to the patient via MyChart unless otherwise noted below.   The patient was advised to call back or seek an in-person evaluation if the symptoms worsen or if the condition fails to improve as anticipated.    Leslie Maillard, PA-C

## 2024-05-10 NOTE — Patient Instructions (Signed)
 [name], thank you for joining [name], PA-C for today's virtual visit.  While this provider is not your primary care provider (PCP), if your PCP is located in our provider database this encounter information will be shared with them immediately following your visit.   A [facility] MyChart account gives you access to today's visit and all your visits, tests, and labs performed at [facility] " click here if you don't have a [facility] MyChart account or go to [email or web address]  Consent: (Patient) [name] provided verbal consent for this virtual visit at the beginning of the encounter.  Current Medications:  Current Outpatient Medications:    albuterol  (VENTOLIN  HFA) 108 (90 Base) MCG/ACT inhaler, INHALE 1-2 PUFFS BY MOUTH EVERY 6 HOURS AS NEEDED FOR WHEEZE OR SHORTNESS OF BREATH, Disp: 1 each, Rfl: 0   amLODipine  (NORVASC ) 10 MG tablet, Take 1 tablet (10 mg total) by mouth daily., Disp: 90 tablet, Rfl: 3   Ascorbic Acid (VITAMIN C) 1000 MG tablet, Take 1,000 mg by mouth daily., Disp: , Rfl:    aspirin  EC 81 MG tablet, Take 81 mg by mouth every 6 (six) hours as needed. Swallow whole. (Patient not taking: Reported on 05/03/2024), Disp: , Rfl:    Cholecalciferol (VITAMIN D3) 125 MCG (5000 UT) CAPS, Take 1 capsule by mouth daily., Disp: , Rfl:    clotrimazole -betamethasone  (LOTRISONE ) cream, Apply 1 Application topically 2 (two) times daily. (Patient not taking: Reported on 05/03/2024), Disp: 30 g, Rfl: 3   doxazosin  (CARDURA ) 2 MG tablet, Take 1 tablet (2 mg total) by mouth daily., Disp: 90 tablet, Rfl: 3   ferrous sulfate  325 (65 FE) MG tablet, Take 1 tablet (325 mg total) by mouth daily with breakfast., Disp: 270 tablet, Rfl: 1   hydrALAZINE  (APRESOLINE ) 25 MG tablet, Take 1 tablet by mouth twice a day if systolic blood pressure is greater than 160 (Patient not taking: Reported on 05/03/2024), Disp: 60 tablet, Rfl: 2   labetalol  (NORMODYNE ) 200 MG  tablet, Take 1 tablet (200 mg total) by mouth 2 (two) times daily., Disp: 180 tablet, Rfl: 3   Loratadine (CLARITIN PO), Take by mouth daily., Disp: , Rfl:    Melatonin 5 MG CHEW, Chew 5 mg by mouth., Disp: , Rfl:    Multiple Vitamins-Minerals (AIRBORNE PO), Take by mouth., Disp: , Rfl:    Omega 3-6-9 Fatty Acids (OMEGA 3-6-9 COMPLEX) CAPS, Take 1 capsule by mouth daily., Disp: , Rfl:    PARoxetine  (PAXIL  CR) 12.5 MG 24 hr tablet, Take 1 tablet (12.5 mg total) by mouth daily., Disp: 90 tablet, Rfl: 1   potassium chloride  SA (KLOR-CON  M) 20 MEQ tablet, Take 1 tablet (20 mEq total) by mouth 2 (two) times daily., Disp: 180 tablet, Rfl: 3   rosuvastatin  (CRESTOR ) 5 MG tablet, Take 1 tablet (5 mg total) by mouth daily. (Patient taking differently: Take 2.5 mg by mouth daily. Pt takes half a tablet daily.), Disp: 90 tablet, Rfl: 3   valsartan -hydrochlorothiazide  (DIOVAN -HCT) 320-25 MG tablet, TAKE 1 TABLET DAILY, Disp: 90 tablet, Rfl: 3   Medications ordered in this encounter:  No orders of the defined types were placed in this encounter.    *If you need refills on other medications prior to your next appointment, please contact your pharmacy*  Follow-Up: Call back or seek an in-person evaluation if the symptoms worsen or if the condition fails to improve as anticipated.  [facility] [phone]  Other Instructions Please take  antibiotic as directed.  Increase fluid intake.  Use Saline nasal spray.  Take a daily multivitamin. Use Tessalon  as directed for cough. Ok to continue your OTC medications.  Place a humidifier in the bedroom.  Please call or return clinic if symptoms are not improving.  Sinusitis Sinusitis is redness, soreness, and swelling (inflammation) of the paranasal sinuses. Paranasal sinuses are air pockets within the bones of your face (beneath the eyes, the middle of the forehead, or above the eyes). In healthy paranasal sinuses, mucus is able to drain out, and air  is able to circulate through them by way of your nose. However, when your paranasal sinuses are inflamed, mucus and air can become trapped. This can allow bacteria and other germs to grow and cause infection. Sinusitis can develop quickly and last only a short time (acute) or continue over a long period (chronic). Sinusitis that lasts for more than 12 weeks is considered chronic.  CAUSES  Causes of sinusitis include: Allergies. Structural abnormalities, such as displacement of the cartilage that separates your nostrils (deviated septum), which can decrease the air flow through your nose and sinuses and affect sinus drainage. Functional abnormalities, such as when the small hairs (cilia) that line your sinuses and help remove mucus do not work properly or are not present. SYMPTOMS  Symptoms of acute and chronic sinusitis are the same. The primary symptoms are pain and pressure around the affected sinuses. Other symptoms include: Upper toothache. Earache. Headache. Bad breath. Decreased sense of smell and taste. A cough, which worsens when you are lying flat. Fatigue. Fever. Thick drainage from your nose, which often is green and may contain pus (purulent). Swelling and warmth over the affected sinuses. DIAGNOSIS  Your caregiver will perform a physical exam. During the exam, your caregiver may: Look in your nose for signs of abnormal growths in your nostrils (nasal polyps). Tap over the affected sinus to check for signs of infection. View the inside of your sinuses (endoscopy) with a special imaging device with a light attached (endoscope), which is inserted into your sinuses. If your caregiver suspects that you have chronic sinusitis, one or more of the following tests may be recommended: Allergy tests. Nasal culture A sample of mucus is taken from your nose and sent to a lab and screened for bacteria. Nasal cytology A sample of mucus is taken from your nose and examined by your caregiver  to determine if your sinusitis is related to an allergy. TREATMENT  Most cases of acute sinusitis are related to a viral infection and will resolve on their own within 10 days. Sometimes medicines are prescribed to help relieve symptoms (pain medicine, decongestants, nasal steroid sprays, or saline sprays).  However, for sinusitis related to a bacterial infection, your caregiver will prescribe antibiotic medicines. These are medicines that will help kill the bacteria causing the infection.  Rarely, sinusitis is caused by a fungal infection. In theses cases, your caregiver will prescribe antifungal medicine. For some cases of chronic sinusitis, surgery is needed. Generally, these are cases in which sinusitis recurs more than 3 times per year, despite other treatments. HOME CARE INSTRUCTIONS  Drink plenty of water. Water helps thin the mucus so your sinuses can drain more easily. Use a humidifier. Inhale steam 3 to 4 times a day (for example, sit in the bathroom with the shower running). Apply a warm, moist washcloth to your face 3 to 4 times a day, or as directed by your caregiver. Use saline nasal sprays to help  moisten and clean your sinuses. Take over-the-counter or prescription medicines for pain, discomfort, or fever only as directed by your caregiver. SEEK IMMEDIATE MEDICAL CARE IF: You have increasing pain or severe headaches. You have nausea, vomiting, or drowsiness. You have swelling around your face. You have vision problems. You have a stiff neck. You have difficulty breathing. MAKE SURE YOU:  Understand these instructions. Will watch your condition. Will get help right away if you are not doing well or get worse. Document Released: 12/01/2005 Document Revised: 02/23/2012 Document Reviewed: 12/16/2011 [facility] Patient Information 2014 [place], [place].    If you have been instructed to have an in-person evaluation today at a local Urgent Care facility, please use the link below.  It will take you to a list of all of our available [facility], including address, phone number and hours of operation. Please do not delay care.  [facility]  If you or a family member do not have a primary care provider, use the link below to schedule a visit and establish care. When you choose a [facility] primary care physician or advanced practice provider, you gain a long-term partner in health. Find a Primary Care Provider  Learn more about [facility]'s in-office and virtual care options: [facility] - Get Care Now

## 2024-05-17 ENCOUNTER — Other Ambulatory Visit

## 2024-05-17 NOTE — Telephone Encounter (Signed)
 Pt arrived to the office for a lab visit that was scheduled by E2C2. Pt informed that she does not need a lab visit, her labs will be repeated at the upcoming OV with PCP on 06/28/24. Explained that lab iron  panel was elevated and no supplements needed at this time. PCP will reassess at upcoming OV.

## 2024-05-18 ENCOUNTER — Ambulatory Visit
Admission: RE | Admit: 2024-05-18 | Discharge: 2024-05-18 | Disposition: A | Discharge status: Home / Self Care | Source: Ambulatory Visit | Attending: Family Medicine | Admitting: Family Medicine

## 2024-05-18 VITALS — BP 136/88 | HR 82 | Temp 98.1°F | Resp 18 | Wt 229.9 lb

## 2024-05-18 DIAGNOSIS — B353 Tinea pedis: Secondary | ICD-10-CM

## 2024-05-18 DIAGNOSIS — E66811 Obesity, class 1: Secondary | ICD-10-CM | POA: Insufficient documentation

## 2024-05-18 DIAGNOSIS — K5900 Constipation, unspecified: Secondary | ICD-10-CM | POA: Insufficient documentation

## 2024-05-18 MED ORDER — KETOCONAZOLE 2 % EX CREA: 1.0000 | TOPICAL_CREAM | Freq: Every day | CUTANEOUS | 0 refills | Status: DC
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
## 2024-05-18 NOTE — ED Triage Notes (Signed)
 A bug bit btwn my toes now I have a lot of dead skin - Entered by patient  Pt c/o left pinky toe problem. Pt says something bit her about 2 weeks ago and now she has a lot of dead skin beneath her pinky toe.

## 2024-05-18 NOTE — Discharge Instructions (Signed)
 In addition to the cream prescribed, you may use over the counter [place]omeboro soaks.

## 2024-05-23 ENCOUNTER — Other Ambulatory Visit

## 2024-05-24 ENCOUNTER — Ambulatory Visit: Payer: Self-pay

## 2024-05-24 ENCOUNTER — Ambulatory Visit (INDEPENDENT_AMBULATORY_CARE_PROVIDER_SITE_OTHER): Admitting: Family Medicine

## 2024-05-24 ENCOUNTER — Encounter: Payer: Self-pay | Admitting: Family Medicine

## 2024-05-24 VITALS — BP 150/94 | HR 79 | Temp 97.6°F | Ht 70.0 in | Wt 226.8 lb

## 2024-05-24 DIAGNOSIS — J40 Bronchitis, not specified as acute or chronic: Secondary | ICD-10-CM | POA: Insufficient documentation

## 2024-05-24 DIAGNOSIS — I1A0 Resistant hypertension: Secondary | ICD-10-CM

## 2024-05-24 NOTE — Assessment & Plan Note (Signed)
 Blood pressure is continuing to be high. Adherence to her regimen has been an issue for Leslie [name] in the past, though she states she is taking her medications currently. Continue valsartan -HCTZ 320-25 mg daily, amlodipine  10 mg daily, and labetalol  200 mg 1/2 tab each morning and 1 tab each evening, and doxazosin  2 mg daily. She also has hydralazine  25 mg to take as needed for systolic BP > 160. I strongly recommend she keep her appointment with the [facility].

## 2024-05-24 NOTE — Telephone Encounter (Signed)
 FYI Only or Action Required?: FYI only for provider  Patient was last seen in primary care on 03/29/2024 by Graig Lawyer, MD. Called Nurse Triage reporting Sore Throat and Shortness of Breath. Symptoms began several weeks ago. Interventions attempted: Prescription medications: Augmentin , tessalon  and Rest, hydration, or home remedies. Symptoms are: some have resolved, some have worsened.  Triage Disposition: See HCP Within 4 Hours (Or PCP Triage)  Patient/caregiver understands and will follow disposition?: Yes                Copied from CRM [id number]. Topic: Clinical - Red Word Triage >> May 24, 2024 12:37 PM Leslie [name] wrote: Red Word that prompted transfer to Nurse Triage: Strep, coughing up film for 2 weeks, gradually getting worse, difficulty breathing Reason for Disposition  [1] MILD difficulty breathing (e.g., minimal/no SOB at rest, SOB with walking, pulse <100) AND [2] NEW-onset or WORSE than normal  Answer Assessment - Initial Assessment Questions 1. RESPIRATORY STATUS: "Describe your breathing?" (e.g., wheezing, shortness of breath, unable to speak, severe coughing)      SOB with activity  2. ONSET: "When did this breathing problem begin?"      "Going on since school" 3. PATTERN "Does the difficult breathing come and go, or has it been constant since it started?"      Come and go  4. SEVERITY: "How bad is your breathing?" (e.g., mild, moderate, severe)    - MILD: No SOB at rest, mild SOB with walking, speaks normally in sentences, can lie down, no retractions, pulse < 100.    - MODERATE: SOB at rest, SOB with minimal exertion and prefers to sit, cannot lie down flat, speaks in phrases, mild retractions, audible wheezing, pulse 100-120.    - SEVERE: Very SOB at rest, speaks in single words, struggling to breathe, sitting hunched forward, retractions, pulse > 120      None at rest, does endorse SOB on exertion (moving bags, a lot of talking) 5. RECURRENT SYMPTOM: "Have  you had difficulty breathing before?" If Yes, ask: "When was the last time?" and "What happened that time?"      yes 6. CARDIAC HISTORY: "Do you have any history of heart disease?" (e.g., heart attack, angina, bypass surgery, angioplasty)      OSA, asthma 7. LUNG HISTORY: "Do you have any history of lung disease?"  (e.g., pulmonary embolus, asthma, emphysema)     CAD, HTN, cardiomegaly  8. CAUSE: "What do you think is causing the breathing problem?"      "Well, I've had CHF before", "I don't want the fluid overtaking my heart" 9. OTHER SYMPTOMS: "Do you have any other symptoms? (e.g., dizziness, runny nose, cough, chest pain, fever)      Prescribed Augmentin  5/27 for possible strep throat, sore throat, coughing up phlegm for 2 wks (yellow, white), feels fluid in her chest while she is lying down, endorses CP but none now ("veins pulling"), sleepiness, hx sickle cell   Thinks she has pneumonia   Denies fever  10. O2 SATURATION MONITOR:  "Do you use an oxygen saturation monitor (pulse oximeter) at home?" If Yes, ask: "What is your reading (oxygen level) today?" "What is your usual oxygen saturation reading?" (e.g., 95%)  Protocols used: Breathing Difficulty-A-AH

## 2024-05-24 NOTE — Telephone Encounter (Signed)
 Noted. Dm/cma

## 2024-05-24 NOTE — Assessment & Plan Note (Signed)
 Discussed home care for viral illness, including rest, pushing fluids, and OTC medications as needed for symptom relief. Recommend hot tea with honey for sore throat symptoms. I feel antibiotics were likely unnecessary for her. I do not recommend that she hold onto her unused portion. Her response leads me to believe she will not follow this advice. Follow-up if needed for worsening or persistent symptoms.
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
## 2024-06-01 ENCOUNTER — Ambulatory Visit (HOSPITAL_BASED_OUTPATIENT_CLINIC_OR_DEPARTMENT_OTHER): Admitting: Cardiovascular Disease

## 2024-06-01 ENCOUNTER — Encounter (HOSPITAL_BASED_OUTPATIENT_CLINIC_OR_DEPARTMENT_OTHER): Payer: Self-pay | Admitting: Cardiovascular Disease

## 2024-06-01 VITALS — BP 138/86 | HR 72 | Ht 70.0 in | Wt 228.0 lb

## 2024-06-01 DIAGNOSIS — I5189 Other ill-defined heart diseases: Secondary | ICD-10-CM | POA: Diagnosis not present

## 2024-06-01 DIAGNOSIS — I10 Essential (primary) hypertension: Secondary | ICD-10-CM | POA: Diagnosis not present

## 2024-06-01 MED ORDER — DOXAZOSIN MESYLATE 4 MG PO TABS: 4.0000 mg | ORAL_TABLET | Freq: Every day | ORAL | 3 refills | Status: DC
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
## 2024-06-01 NOTE — Patient Instructions (Signed)
 Medication Instructions:  INCREASE YOUR DOXAZOSIN  TO 4 MG   Labwork: NONE  Testing/Procedures: Your physician has requested that you have an echocardiogram. Echocardiography is a painless test that uses sound waves to create images of your heart. It provides your doctor with information about the size and shape of your heart and how well your heart's chambers and valves are working. This procedure takes approximately one hour. There are no restrictions for this procedure. Please do NOT wear cologne, perfume, aftershave, or lotions (deodorant is allowed). Please arrive 15 minutes prior to your appointment time.  Please note: We ask at that you not bring children with you during ultrasound (echo/ vascular) testing. Due to room size and safety concerns, children are not allowed in the ultrasound rooms during exams. Our front office staff cannot provide observation of children in our lobby area while testing is being conducted. An adult accompanying a patient to their appointment will only be allowed in the ultrasound room at the discretion of the ultrasound technician under special circumstances. We apologize for any inconvenience  Follow-Up: 3 MONTHS WITH DR Theodis Fiscal, CAITLIN W NP, OR MICHELLE S NP   Any Other Special Instructions Will Be Listed Below (If Applicable).   If you need a refill on your cardiac medications before your next appointment, please call your pharmacy.

## 2024-06-08 DIAGNOSIS — M25511 Pain in right shoulder: Secondary | ICD-10-CM | POA: Diagnosis not present

## 2024-06-10 DIAGNOSIS — M25611 Stiffness of right shoulder, not elsewhere classified: Secondary | ICD-10-CM | POA: Diagnosis not present

## 2024-06-10 DIAGNOSIS — R531 Weakness: Secondary | ICD-10-CM | POA: Diagnosis not present

## 2024-06-10 DIAGNOSIS — M25511 Pain in right shoulder: Secondary | ICD-10-CM | POA: Diagnosis not present

## 2024-06-11 ENCOUNTER — Telehealth: Admitting: Family Medicine

## 2024-06-11 DIAGNOSIS — R234 Changes in skin texture: Secondary | ICD-10-CM

## 2024-06-11 NOTE — Progress Notes (Signed)
 Virtual Visit Consent   Leslie [name], you are scheduled for a virtual visit with a [facility] provider today. Just as with appointments in the office, your consent must be obtained to participate. Your consent will be active for this visit and any virtual visit you may have with one of our providers in the next 365 days. If you have a MyChart account, a copy of this consent can be sent to you electronically.  As this is a virtual visit, video technology does not allow for your provider to perform a traditional examination. This may limit your provider's ability to fully assess your condition. If your provider identifies any concerns that need to be evaluated in person or the need to arrange testing (such as labs, EKG, etc.), we will make arrangements to do so. Although advances in technology are sophisticated, we cannot ensure that it will always work on either your end or our end. If the connection with a video visit is poor, the visit may have to be switched to a telephone visit. With either a video or telephone visit, we are not always able to ensure that we have a secure connection.  By engaging in this virtual visit, you consent to the provision of healthcare and authorize for your insurance to be billed (if applicable) for the services provided during this visit. Depending on your insurance coverage, you may receive a charge related to this service.  I need to obtain your verbal consent now. Are you willing to proceed with your visit today? Leslie [name] has provided verbal consent on 06/11/2024 for a virtual visit (video or telephone). Leslie [name], [place]  Date: 06/11/2024 10:21 AM   Virtual Visit via Video Note   I, Leslie [name], connected with  Leslie [name]  ([id number], Dec 28, 1975) on 06/11/24 at 10:15 AM EDT by a video-enabled telemedicine application and verified that I am speaking with the correct person using two identifiers.  Location: Patient: Virtual Visit Location Patient:  Home Provider: Virtual Visit Location Provider: Home Office   I discussed the limitations of evaluation and management by telemedicine and the availability of in person appointments. The patient expressed understanding and agreed to proceed.    History of Present Illness: Leslie [name] is a 48 y.o. who identifies as a female who was assigned female at birth, and is being seen today for c/o she is actually moving and she was cleaning a book case and says pieces of what she thinks are asbestos were coming off the bookcase and some of it fell on her face.  Pt states she took a washcloth and washed the black spots off but not all of it came off.  Pt denies itching but states yesterday it was burning. Pt denies getting any of the pieces in her eye. Pt proceeds to peel skin during the visit.  Pt states it seems to be starting to come off.  Pt states only has the mark on the right side of her cheek bone. Pt states she wanted a cream to try and remove the skin.    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   Sickle cell trait (HCC) 11/04/2018   Hyperlipidemia 04/30/2018   DDD (degenerative disc disease), lumbar 08/06/2016   Cardiomegaly 05/03/2014   Edema 04/26/2014   Iron  deficiency anemia 04/06/2014   Carotid artery disease (HCC) 04/06/2014   Anxiety 07/29/2012   Other abnormal glucose 11/09/2007   Resistant hypertension 08/31/2007    Allergies:  Allergies  Allergen Reactions   Amlodipine      edema   Bee Venom    Bystolic  [Nebivolol  Hcl]     Stomach cramping     Lisinopril Rash and Dermatitis    REACTION: Rash   Medications:  Current Outpatient Medications:    albuterol  (VENTOLIN  HFA) 108 (90 Base) MCG/ACT inhaler, INHALE 1-2 PUFFS BY MOUTH EVERY 6 HOURS AS NEEDED FOR WHEEZE OR SHORTNESS OF BREATH, Disp: 1 each, Rfl: 0   amLODipine  (NORVASC ) 10 MG tablet, Take 1 tablet (10 mg total) by mouth daily., Disp: 90 tablet, Rfl: 3   Ascorbic Acid (VITAMIN C) 1000 MG tablet, Take 1,000 mg by mouth daily., Disp: , Rfl:    aspirin  EC 81 MG tablet, Take 81 mg by mouth every 6 (six) hours as needed. Swallow whole., Disp: , Rfl:    benzonatate  (TESSALON ) 100 MG capsule, Take 1 capsule (100 mg total) by mouth 3 (three) times daily as needed for cough., Disp: 30 capsule, Rfl: 0   Cholecalciferol (VITAMIN D3) 125 MCG (5000 UT) CAPS, Take 1 capsule by mouth daily., Disp: , Rfl:    clotrimazole -betamethasone  (LOTRISONE ) cream, Apply 1 Application topically 2 (two) times daily. (Patient not taking: Reported on 06/01/2024), Disp: 30 g, Rfl: 3   doxazosin  (CARDURA ) 4 MG tablet, Take 1 tablet (4 mg total) by mouth daily., Disp: 90 tablet, Rfl: 3   ferrous sulfate  325 (65 FE) MG tablet, Take 1 tablet (325 mg total) by mouth daily with breakfast. (Patient not taking: Reported on 06/01/2024), Disp: 270 tablet, Rfl: 1   hydrALAZINE  (APRESOLINE ) 25 MG tablet, Take 1 tablet by mouth twice a day if systolic blood pressure is greater than 160, Disp: 60 tablet, Rfl: 2   ketoconazole  (NIZORAL ) 2 % cream, Apply 1 Application topically daily. For up to two weeks., Disp: 60 g, Rfl: 0   labetalol  (NORMODYNE ) 200 MG tablet, Take 1 tablet (200 mg total) by mouth 2 (two) times daily., Disp: 180 tablet, Rfl: 3   Loratadine (CLARITIN PO), Take by mouth daily. (Patient not taking: Reported on 06/01/2024), Disp: , Rfl:    Melatonin 5 MG CHEW, Chew 5 mg by mouth., Disp: , Rfl:    Multiple Vitamins-Minerals (AIRBORNE PO), Take by mouth., Disp: , Rfl:    Omega 3-6-9 Fatty Acids (OMEGA 3-6-9 COMPLEX)  CAPS, Take 1 capsule by mouth daily., Disp: , Rfl:    PARoxetine  (PAXIL  CR) 12.5 MG 24 hr tablet, Take 1 tablet (12.5 mg total) by mouth daily., Disp: 90 tablet, Rfl: 1   potassium chloride  SA (KLOR-CON  M) 20 MEQ tablet, Take 1 tablet (20 mEq total) by mouth 2 (two) times daily., Disp: 180 tablet, Rfl: 3   predniSONE  (DELTASONE ) 20 MG tablet, Take 40 mg by mouth daily. (Patient not taking: Reported on 06/01/2024), Disp: , Rfl:    rosuvastatin  (CRESTOR ) 5 MG tablet, Take 1 tablet (5 mg total) by mouth daily. (Patient taking differently: Take 2.5 mg by mouth daily. Pt takes half a tablet daily.), Disp: 90 tablet, Rfl: 3   valsartan -hydrochlorothiazide  (DIOVAN -HCT) 320-25 MG tablet, TAKE 1 TABLET DAILY,  Disp: 90 tablet, Rfl: 3  Observations/Objective: Patient is well-developed, well-nourished in no acute distress.  Resting comfortably at home.  Head is normocephalic, atraumatic.  No labored breathing. Speech is clear and coherent with logical content.  Patient is alert and oriented at baseline.    Assessment and Plan: 1. Peeling skin (Primary)  -Advised Pt to not peel skin and just monitor. -Pt advised to follow up in person with PCP or urgent care if worsening symptoms.   Follow Up Instructions: I discussed the assessment and treatment plan with the patient. The patient was provided an opportunity to ask questions and all were answered. The patient agreed with the plan and demonstrated an understanding of the instructions.  A copy of instructions were sent to the patient via MyChart unless otherwise noted below.     The patient was advised to call back or seek an in-person evaluation if the symptoms worsen or if the condition fails to improve as anticipated.    Leslie Mater, PA-C

## 2024-06-14 ENCOUNTER — Encounter: Payer: Self-pay | Admitting: Cardiology

## 2024-06-14 ENCOUNTER — Other Ambulatory Visit: Payer: Self-pay

## 2024-06-14 DIAGNOSIS — Z79899 Other long term (current) drug therapy: Secondary | ICD-10-CM

## 2024-06-14 DIAGNOSIS — I10 Essential (primary) hypertension: Secondary | ICD-10-CM

## 2024-06-14 MED ORDER — ROSUVASTATIN CALCIUM 5 MG PO TABS: 5.0000 mg | ORAL_TABLET | Freq: Every day | ORAL | 3 refills | Status: DC

## 2024-06-14 MED ORDER — AMLODIPINE BESYLATE 10 MG PO TABS
10.0000 mg | ORAL_TABLET | Freq: Every day | ORAL | 3 refills | Status: DC
Start: 2024-06-14 — End: 2024-09-09

## 2024-06-21 DIAGNOSIS — M25611 Stiffness of right shoulder, not elsewhere classified: Secondary | ICD-10-CM | POA: Diagnosis not present

## 2024-06-21 DIAGNOSIS — R531 Weakness: Secondary | ICD-10-CM | POA: Diagnosis not present

## 2024-06-21 DIAGNOSIS — M25511 Pain in right shoulder: Secondary | ICD-10-CM | POA: Diagnosis not present

## 2024-06-23 DIAGNOSIS — M25611 Stiffness of right shoulder, not elsewhere classified: Secondary | ICD-10-CM | POA: Diagnosis not present

## 2024-06-23 DIAGNOSIS — R531 Weakness: Secondary | ICD-10-CM | POA: Diagnosis not present

## 2024-06-23 DIAGNOSIS — M25511 Pain in right shoulder: Secondary | ICD-10-CM | POA: Diagnosis not present

## 2024-06-27 DIAGNOSIS — R531 Weakness: Secondary | ICD-10-CM | POA: Diagnosis not present

## 2024-06-27 DIAGNOSIS — M25611 Stiffness of right shoulder, not elsewhere classified: Secondary | ICD-10-CM | POA: Diagnosis not present

## 2024-06-27 DIAGNOSIS — M25511 Pain in right shoulder: Secondary | ICD-10-CM | POA: Diagnosis not present

## 2024-06-28 ENCOUNTER — Encounter: Payer: Self-pay | Admitting: Family Medicine

## 2024-06-28 ENCOUNTER — Ambulatory Visit: Admitting: Family Medicine

## 2024-06-28 VITALS — BP 124/82 | HR 60 | Temp 97.8°F | Ht 70.0 in | Wt 229.8 lb

## 2024-06-28 DIAGNOSIS — I1A0 Resistant hypertension: Secondary | ICD-10-CM

## 2024-06-28 DIAGNOSIS — S46011D Strain of muscle(s) and tendon(s) of the rotator cuff of right shoulder, subsequent encounter: Secondary | ICD-10-CM

## 2024-06-28 DIAGNOSIS — S46011A Strain of muscle(s) and tendon(s) of the rotator cuff of right shoulder, initial encounter: Secondary | ICD-10-CM | POA: Insufficient documentation

## 2024-06-28 NOTE — Assessment & Plan Note (Signed)
 Blood pressure is in acceptable control today. Continue valsartan -HCTZ 320-25 mg daily, amlodipine  10 mg daily, labetalol  200 mg 1/2 tab each morning and 1 tab each evening, and doxazosin  4 mg daily. She also has hydralazine  25 mg to take as needed for systolic BP > 160. I strongly recommend she keep her appointment with the [facility].
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
## 2024-06-28 NOTE — Assessment & Plan Note (Signed)
 Continue physical therapy and working with Dr. Dozier under her workman's comp claim.

## 2024-06-29 ENCOUNTER — Ambulatory Visit (INDEPENDENT_AMBULATORY_CARE_PROVIDER_SITE_OTHER): Payer: Self-pay | Admitting: Nurse Practitioner

## 2024-06-29 VITALS — Wt 229.0 lb

## 2024-06-29 DIAGNOSIS — I1A0 Resistant hypertension: Secondary | ICD-10-CM

## 2024-06-29 NOTE — Progress Notes (Signed)
 Has a PCP and saw them yesterday ,denies  any complaints and will maintain close follow up with PCP

## 2024-06-30 ENCOUNTER — Ambulatory Visit (INDEPENDENT_AMBULATORY_CARE_PROVIDER_SITE_OTHER)

## 2024-06-30 ENCOUNTER — Encounter: Payer: Self-pay | Admitting: Family Medicine

## 2024-06-30 DIAGNOSIS — I10 Essential (primary) hypertension: Secondary | ICD-10-CM | POA: Diagnosis not present

## 2024-06-30 DIAGNOSIS — I5189 Other ill-defined heart diseases: Secondary | ICD-10-CM | POA: Diagnosis not present

## 2024-06-30 LAB — ECHOCARDIOGRAM COMPLETE
Area-P 1/2: 3.53 cm2
S' Lateral: 3.26 cm

## 2024-07-01 DIAGNOSIS — M25511 Pain in right shoulder: Secondary | ICD-10-CM | POA: Diagnosis not present

## 2024-07-01 DIAGNOSIS — M25611 Stiffness of right shoulder, not elsewhere classified: Secondary | ICD-10-CM | POA: Diagnosis not present

## 2024-07-01 DIAGNOSIS — R531 Weakness: Secondary | ICD-10-CM | POA: Diagnosis not present

## 2024-07-05 DIAGNOSIS — M25611 Stiffness of right shoulder, not elsewhere classified: Secondary | ICD-10-CM | POA: Diagnosis not present

## 2024-07-05 DIAGNOSIS — M25511 Pain in right shoulder: Secondary | ICD-10-CM | POA: Diagnosis not present

## 2024-07-05 DIAGNOSIS — R531 Weakness: Secondary | ICD-10-CM | POA: Diagnosis not present

## 2024-07-07 DIAGNOSIS — R531 Weakness: Secondary | ICD-10-CM | POA: Diagnosis not present

## 2024-07-07 DIAGNOSIS — M25611 Stiffness of right shoulder, not elsewhere classified: Secondary | ICD-10-CM | POA: Diagnosis not present

## 2024-07-07 DIAGNOSIS — M25511 Pain in right shoulder: Secondary | ICD-10-CM | POA: Diagnosis not present

## 2024-07-09 ENCOUNTER — Ambulatory Visit: Payer: Self-pay | Admitting: Cardiovascular Disease

## 2024-07-13 DIAGNOSIS — M25511 Pain in right shoulder: Secondary | ICD-10-CM | POA: Diagnosis not present

## 2024-07-15 DIAGNOSIS — Z1231 Encounter for screening mammogram for malignant neoplasm of breast: Secondary | ICD-10-CM | POA: Diagnosis not present

## 2024-07-18 ENCOUNTER — Other Ambulatory Visit: Payer: Self-pay | Admitting: Obstetrics & Gynecology

## 2024-07-18 DIAGNOSIS — N6489 Other specified disorders of breast: Secondary | ICD-10-CM

## 2024-08-03 ENCOUNTER — Other Ambulatory Visit

## 2024-08-03 ENCOUNTER — Ambulatory Visit
Admission: RE | Admit: 2024-08-03 | Discharge: 2024-08-03 | Disposition: A | Discharge status: Home / Self Care | Source: Ambulatory Visit | Attending: Obstetrics & Gynecology | Admitting: Obstetrics & Gynecology

## 2024-08-03 ENCOUNTER — Encounter: Payer: Self-pay | Admitting: Obstetrics & Gynecology

## 2024-08-03 DIAGNOSIS — R928 Other abnormal and inconclusive findings on diagnostic imaging of breast: Secondary | ICD-10-CM | POA: Diagnosis not present

## 2024-08-03 DIAGNOSIS — N6489 Other specified disorders of breast: Secondary | ICD-10-CM

## 2024-08-05 DIAGNOSIS — M25511 Pain in right shoulder: Secondary | ICD-10-CM | POA: Diagnosis not present

## 2024-08-17 ENCOUNTER — Encounter: Payer: Self-pay | Admitting: Family Medicine

## 2024-08-24 ENCOUNTER — Ambulatory Visit: Admitting: Family Medicine

## 2024-08-24 ENCOUNTER — Encounter: Payer: Self-pay | Admitting: Family Medicine

## 2024-08-24 VITALS — BP 130/76 | HR 79 | Temp 97.5°F | Ht 70.0 in | Wt 230.0 lb

## 2024-08-24 DIAGNOSIS — I119 Hypertensive heart disease without heart failure: Secondary | ICD-10-CM

## 2024-08-24 DIAGNOSIS — D573 Sickle-cell trait: Secondary | ICD-10-CM

## 2024-08-24 DIAGNOSIS — D509 Iron deficiency anemia, unspecified: Secondary | ICD-10-CM

## 2024-08-24 DIAGNOSIS — Z23 Encounter for immunization: Secondary | ICD-10-CM

## 2024-08-24 DIAGNOSIS — D696 Thrombocytopenia, unspecified: Secondary | ICD-10-CM | POA: Diagnosis not present

## 2024-08-24 DIAGNOSIS — Z0000 Encounter for general adult medical examination without abnormal findings: Secondary | ICD-10-CM | POA: Diagnosis not present

## 2024-08-24 DIAGNOSIS — J4520 Mild intermittent asthma, uncomplicated: Secondary | ICD-10-CM

## 2024-08-24 DIAGNOSIS — E782 Mixed hyperlipidemia: Secondary | ICD-10-CM

## 2024-08-24 DIAGNOSIS — S46011D Strain of muscle(s) and tendon(s) of the rotator cuff of right shoulder, subsequent encounter: Secondary | ICD-10-CM

## 2024-08-24 DIAGNOSIS — M25562 Pain in left knee: Secondary | ICD-10-CM

## 2024-08-24 DIAGNOSIS — I1A0 Resistant hypertension: Secondary | ICD-10-CM | POA: Diagnosis not present

## 2024-08-24 DIAGNOSIS — R7303 Prediabetes: Secondary | ICD-10-CM

## 2024-08-24 DIAGNOSIS — T148XXA Other injury of unspecified body region, initial encounter: Secondary | ICD-10-CM

## 2024-08-24 DIAGNOSIS — Z1159 Encounter for screening for other viral diseases: Secondary | ICD-10-CM

## 2024-08-24 MED ORDER — DOXAZOSIN MESYLATE 4 MG PO TABS
4.0000 mg | ORAL_TABLET | Freq: Every day | ORAL | 3 refills | Status: AC
Start: 2024-08-24 — End: ?

## 2024-08-24 NOTE — Assessment & Plan Note (Signed)
 Moderate LVH on echo with normal EF. Continue focus on good blood pressure control.

## 2024-08-24 NOTE — Assessment & Plan Note (Signed)
I will check her glucose and A1c today.

## 2024-08-24 NOTE — Assessment & Plan Note (Signed)
 Stable. Continue albuterol as needed.

## 2024-08-24 NOTE — Assessment & Plan Note (Signed)
 I will check her annual lipids. Continue rosuvastatin  5 mg daily.

## 2024-08-24 NOTE — Assessment & Plan Note (Signed)
 I will reassess her platelet count.

## 2024-08-24 NOTE — Assessment & Plan Note (Signed)
 I will reassess her CBC and iron levels today.

## 2024-08-24 NOTE — Assessment & Plan Note (Signed)
 Blood pressure is in good control today. Continue valsartan -HCTZ 320-25 mg daily, amlodipine  10 mg daily, labetalol  200 mg 1/2 tab each morning and 1 tab each evening, and doxazosin  4 mg daily. She also has hydralazine  25 mg to take as needed for systolic BP > 160. Continue to follow with the [facility].

## 2024-08-24 NOTE — Assessment & Plan Note (Signed)
 Overall health is good. Recommend regular exercise. Discussed recommended screenings and immunizations.

## 2024-08-24 NOTE — Assessment & Plan Note (Signed)
 Ms. Klatt has been concerned about her sickle cell trait causing a variety of symptoms. I reassured her that it is rare for sickle cell trait to exhibit symptoms except in times of more extreme stress/illness. She admits she has seen a hematologist in the past and they did not offer treatment. I reinforced that this was not likely necessary.

## 2024-08-24 NOTE — Assessment & Plan Note (Signed)
 Leslie [name] would like to see someone at [facility] related to her ongoing issues with her right shoulder injury.
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
## 2024-08-25 ENCOUNTER — Ambulatory Visit (HOSPITAL_BASED_OUTPATIENT_CLINIC_OR_DEPARTMENT_OTHER): Admitting: Family

## 2024-08-25 ENCOUNTER — Encounter (HOSPITAL_BASED_OUTPATIENT_CLINIC_OR_DEPARTMENT_OTHER): Payer: Self-pay | Admitting: Family

## 2024-08-25 ENCOUNTER — Other Ambulatory Visit (INDEPENDENT_AMBULATORY_CARE_PROVIDER_SITE_OTHER)

## 2024-08-25 VITALS — BP 102/64 | HR 86 | Ht 70.0 in | Wt 232.0 lb

## 2024-08-25 DIAGNOSIS — Z1159 Encounter for screening for other viral diseases: Secondary | ICD-10-CM | POA: Diagnosis not present

## 2024-08-25 DIAGNOSIS — E785 Hyperlipidemia, unspecified: Secondary | ICD-10-CM

## 2024-08-25 DIAGNOSIS — R7303 Prediabetes: Secondary | ICD-10-CM | POA: Diagnosis not present

## 2024-08-25 DIAGNOSIS — I25118 Atherosclerotic heart disease of native coronary artery with other forms of angina pectoris: Secondary | ICD-10-CM | POA: Diagnosis not present

## 2024-08-25 DIAGNOSIS — D696 Thrombocytopenia, unspecified: Secondary | ICD-10-CM | POA: Diagnosis not present

## 2024-08-25 DIAGNOSIS — I1A0 Resistant hypertension: Secondary | ICD-10-CM

## 2024-08-25 DIAGNOSIS — D509 Iron deficiency anemia, unspecified: Secondary | ICD-10-CM | POA: Diagnosis not present

## 2024-08-25 DIAGNOSIS — E782 Mixed hyperlipidemia: Secondary | ICD-10-CM | POA: Diagnosis not present

## 2024-08-25 LAB — IRON,TIBC AND FERRITIN PANEL
%SAT: 18 % (ref 16–45)
Ferritin: 56 ng/mL (ref 16–232)
Iron: 65 ug/dL (ref 40–190)
TIBC: 368 ug/dL (ref 250–450)

## 2024-08-25 LAB — LIPID PANEL
Cholesterol: 190 mg/dL (ref 0–200)
HDL: 54.9 mg/dL (ref >39.00)
LDL Cholesterol: 117 mg/dL — ABNORMAL HIGH (ref 0–99)
NonHDL: 134.7
Total CHOL/HDL Ratio: 3
Triglycerides: 90 mg/dL (ref 0.0–149.0)
VLDL: 18 mg/dL (ref 0.0–40.0)

## 2024-08-25 LAB — CBC WITH DIFFERENTIAL/PLATELET
Basophils Absolute: 0 K/uL (ref 0.0–0.1)
Basophils Relative: 0.6 % (ref 0.0–3.0)
Eosinophils Absolute: 0.1 K/uL (ref 0.0–0.7)
Eosinophils Relative: 1.3 % (ref 0.0–5.0)
HCT: 37.2 % (ref 36.0–46.0)
Hemoglobin: 12.3 g/dL (ref 12.0–15.0)
Lymphocytes Relative: 30.9 % (ref 12.0–46.0)
Lymphs Abs: 1.9 K/uL (ref 0.7–4.0)
MCHC: 33 g/dL (ref 30.0–36.0)
MCV: 84.7 fl (ref 78.0–100.0)
Monocytes Absolute: 0.7 K/uL (ref 0.1–1.0)
Monocytes Relative: 10.8 % (ref 3.0–12.0)
Neutro Abs: 3.5 K/uL (ref 1.4–7.7)
Neutrophils Relative %: 56.4 % (ref 43.0–77.0)
Platelets: 128 K/uL — ABNORMAL LOW (ref 150.0–400.0)
RBC: 4.39 Mil/uL (ref 3.87–5.11)
RDW: 15.2 % (ref 11.5–15.5)
WBC: 6.2 K/uL (ref 4.0–10.5)

## 2024-08-25 LAB — COMPREHENSIVE METABOLIC PANEL WITH GFR
ALT: 11 U/L (ref 0–35)
AST: 12 U/L (ref 0–37)
Albumin: 4.5 g/dL (ref 3.5–5.2)
Alkaline Phosphatase: 55 U/L (ref 39–117)
BUN: 16 mg/dL (ref 6–23)
CO2: 26 meq/L (ref 19–32)
Calcium: 9.7 mg/dL (ref 8.4–10.5)
Chloride: 102 meq/L (ref 96–112)
Creatinine, Ser: 0.82 mg/dL (ref 0.40–1.20)
GFR: 84.07 mL/min (ref >60.00)
Glucose, Bld: 104 mg/dL — ABNORMAL HIGH (ref 70–99)
Potassium: 3.5 meq/L (ref 3.5–5.1)
Sodium: 137 meq/L (ref 135–145)
Total Bilirubin: 0.4 mg/dL (ref 0.2–1.2)
Total Protein: 8.1 g/dL (ref 6.0–8.3)

## 2024-08-25 LAB — HEMOGLOBIN A1C: Hgb A1c MFr Bld: 6.6 % — ABNORMAL HIGH (ref 4.6–6.5)

## 2024-08-25 NOTE — Patient Instructions (Addendum)
 Medication Instructions:  Continue your current medications  Follow-Up: Please follow up in 6 months in [facility] with Dr. Raford, Reche Finder, NP or Allean Mink PharmD    Special Instructions:    If your BP is consistently less than 120/80, let us  know and we may be able to reduce your blood pressure regimen.   Please check blood pressure 3-5 times per week at least an hour after your medications.   To prevent or reduce lower extremity swelling: Eat a low salt diet. Salt makes the body hold onto extra fluid which causes swelling. Sit with legs elevated. For example, in the recliner or on an ottoman.  Wear knee-high compression stockings during the daytime. Ones labeled 15-20 mmHg provide good compression.
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
## 2024-08-26 ENCOUNTER — Encounter (HOSPITAL_BASED_OUTPATIENT_CLINIC_OR_DEPARTMENT_OTHER): Payer: Self-pay

## 2024-08-26 LAB — HCV INTERPRETATION

## 2024-08-26 LAB — HCV AB W REFLEX TO QUANT PCR: HCV Ab: NONREACTIVE

## 2024-08-26 NOTE — Telephone Encounter (Signed)
 Patient did take to the Hypertensive clinic as well.  Was advised not to take her medication. Dm/cma

## 2024-08-28 ENCOUNTER — Ambulatory Visit: Payer: Self-pay | Admitting: Family

## 2024-08-28 DIAGNOSIS — F419 Anxiety disorder, unspecified: Secondary | ICD-10-CM

## 2024-08-28 DIAGNOSIS — E782 Mixed hyperlipidemia: Secondary | ICD-10-CM

## 2024-08-28 DIAGNOSIS — I1A0 Resistant hypertension: Secondary | ICD-10-CM

## 2024-08-28 DIAGNOSIS — E876 Hypokalemia: Secondary | ICD-10-CM

## 2024-09-05 MED ORDER — ROSUVASTATIN CALCIUM 10 MG PO TABS: 10.0000 mg | ORAL_TABLET | Freq: Every day | ORAL | 3 refills | Status: DC

## 2024-09-09 DIAGNOSIS — I10 Essential (primary) hypertension: Secondary | ICD-10-CM | POA: Insufficient documentation

## 2024-09-09 DIAGNOSIS — M47816 Spondylosis without myelopathy or radiculopathy, lumbar region: Secondary | ICD-10-CM | POA: Diagnosis not present

## 2024-09-09 MED ORDER — VALSARTAN-HYDROCHLOROTHIAZIDE 320-25 MG PO TABS: 1.0000 | ORAL_TABLET | Freq: Every day | ORAL | 3 refills | Status: AC

## 2024-09-09 MED ORDER — POTASSIUM CHLORIDE CRYS ER 20 MEQ PO TBCR: 20.0000 meq | EXTENDED_RELEASE_TABLET | Freq: Two times a day (BID) | ORAL | 3 refills | Status: AC

## 2024-09-09 MED ORDER — AMLODIPINE BESYLATE 10 MG PO TABS: 10.0000 mg | ORAL_TABLET | Freq: Every day | ORAL | 3 refills | Status: AC

## 2024-09-09 MED ORDER — PAROXETINE HCL ER 12.5 MG PO TB24: 12.5000 mg | ORAL_TABLET | Freq: Every day | ORAL | 3 refills | Status: AC

## 2024-09-09 NOTE — Addendum Note (Signed)
 Addended by: KYM KARNA CROME on: 09/09/2024 03:50 PM   Modules accepted: Orders

## 2024-09-09 NOTE — Addendum Note (Signed)
 Addended by: THEDORA GARNETTE HERO on: 09/09/2024 04:32 PM   Modules accepted: Orders

## 2024-09-12 DIAGNOSIS — M7541 Impingement syndrome of right shoulder: Secondary | ICD-10-CM | POA: Diagnosis not present

## 2024-09-13 DIAGNOSIS — M25562 Pain in left knee: Secondary | ICD-10-CM | POA: Diagnosis not present

## 2024-09-14 NOTE — Addendum Note (Signed)
 Addended by: THEDORA GARNETTE HERO on: 09/14/2024 06:36 PM   Modules accepted: Level of Service

## 2024-09-16 DIAGNOSIS — G4733 Obstructive sleep apnea (adult) (pediatric): Secondary | ICD-10-CM | POA: Diagnosis not present

## 2024-09-22 DIAGNOSIS — M25562 Pain in left knee: Secondary | ICD-10-CM | POA: Diagnosis not present

## 2024-09-28 ENCOUNTER — Ambulatory Visit: Admitting: Family Medicine

## 2024-10-03 DIAGNOSIS — M25562 Pain in left knee: Secondary | ICD-10-CM | POA: Diagnosis not present

## 2024-10-05 DIAGNOSIS — M545 Low back pain, unspecified: Secondary | ICD-10-CM | POA: Diagnosis not present

## 2024-10-07 ENCOUNTER — Encounter: Payer: Self-pay | Admitting: Nurse Practitioner

## 2024-10-07 ENCOUNTER — Ambulatory Visit: Admitting: Nurse Practitioner

## 2024-10-07 VITALS — BP 139/86 | HR 82 | Wt 234.0 lb

## 2024-10-07 DIAGNOSIS — I10 Essential (primary) hypertension: Secondary | ICD-10-CM | POA: Diagnosis not present

## 2024-10-07 DIAGNOSIS — G4733 Obstructive sleep apnea (adult) (pediatric): Secondary | ICD-10-CM

## 2024-10-07 DIAGNOSIS — G43709 Chronic migraine without aura, not intractable, without status migrainosus: Secondary | ICD-10-CM

## 2024-10-07 DIAGNOSIS — E1165 Type 2 diabetes mellitus with hyperglycemia: Secondary | ICD-10-CM | POA: Diagnosis not present

## 2024-10-07 DIAGNOSIS — F419 Anxiety disorder, unspecified: Secondary | ICD-10-CM

## 2024-10-07 DIAGNOSIS — M25511 Pain in right shoulder: Secondary | ICD-10-CM

## 2024-10-07 DIAGNOSIS — E785 Hyperlipidemia, unspecified: Secondary | ICD-10-CM

## 2024-10-07 MED ORDER — UBRELVY 50 MG PO TABS: 50.0000 mg | ORAL_TABLET | Freq: Every day | ORAL | 1 refills | Status: DC | PRN

## 2024-10-07 NOTE — Assessment & Plan Note (Signed)
  Migraines unpredictable, triggered by physical therapy. Imitrex effective but insurance coverage uncertain. - Order [facility] for migraine management. - Obtain records from the previous headache doctor.

## 2024-10-07 NOTE — Assessment & Plan Note (Signed)
 Continue CPAP at night She is a candidate for Zepbound, we again discussed this at her next visit

## 2024-10-07 NOTE — Assessment & Plan Note (Signed)
 Right rotator cuff tear (pending surgical repair) Right rotator cuff tear with limited range of motion. Surgery scheduled for November 7th.

## 2024-10-07 NOTE — Patient Instructions (Addendum)
 Chronic migraine without aura without status migrainosus, not intractable  - Ubrogepant (UBRELVY) 50 MG TABS; Take 1 tablet (50 mg total) by mouth daily as needed.  Dispense: 30 tablet; Refill: 1  It is important that you exercise regularly at least 30 minutes 5 times a week as tolerated  Think about what you will eat, plan ahead. Choose  clean, green, fresh or frozen over canned, processed or packaged foods which are more sugary, salty and fatty. 70 to 75% of food eaten should be vegetables and fruit. Three meals at set times with snacks allowed between meals, but they must be fruit or vegetables. Aim to eat over a 12 hour period , example 7 am to 7 pm, and STOP after  your last meal of the day. Drink water,generally about 64 ounces per day, no other drink is as healthy. Fruit juice is best enjoyed in a healthy way, by EATING the fruit.  Thanks for choosing [facility] we consider it a privelige to serve you.

## 2024-10-07 NOTE — Assessment & Plan Note (Signed)
    07/08/2023   10:49 AM 06/08/2023    3:22 PM 06/27/2022    9:25 AM 09/10/2018    4:30 PM  GAD 7 : Generalized Anxiety Score  Nervous, Anxious, on Edge 1 1 0 1  Control/stop worrying 0 1 0 1  Worry too much - different things 1 0 0 1  Trouble relaxing 0 0 0 1  Restless 0 0 0 0  Easily annoyed or irritable 1 1 0 1  Afraid - awful might happen 0 0 0 1  Total GAD 7 Score 3 3 0 6  Anxiety Difficulty Somewhat difficult Somewhat difficult Not difficult at all   Continue paroxetine  12.5 mg daily

## 2024-10-07 NOTE — Assessment & Plan Note (Addendum)
 BP Readings from Last 3 Encounters:  10/07/24 139/86  08/25/24 102/64  08/24/24 130/76  Blood pressure goal is less than 130/80 Amlodipine  previously caused edema, currently resolved. - Continue current antihypertensive regimen.  Amlodipine  10 mg daily, Cardura  4 mg daily, Diovan -HCTZ 320-25 mg 1 tablet daily, labetalol  200 mg twice daily, hydralazine  25 mg 1 tablet by mouth twice a day if systolic blood pressures greater than 160 - Follow a heart-healthy diet, low in salt and fat. - Engage in moderate exercise as tolerated, 30 minutes 5 days a week.
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
## 2024-10-07 NOTE — Assessment & Plan Note (Signed)
 Lab Results  Component Value Date   CHOL 190 08/25/2024   HDL 54.90 08/25/2024   LDLCALC 117 (H) 08/25/2024   LDLDIRECT 136.0 06/10/2021   TRIG 90.0 08/25/2024   CHOLHDL 3 08/25/2024    Discontinued rosuvastatin , using cayenne pepper. LDL previously elevated.  Advised to take rosuvastatin  10 mg daily as ordered

## 2024-10-07 NOTE — Assessment & Plan Note (Signed)
 Lab Results  Component Value Date   HGBA1C 6.6 (H) 08/25/2024   Advise low-carb diet - Recheck A1c in 3 months. I discussed starting a GLP-1 at her next visit but she is not interested in taking injections, stated that she is taking a GP1 occasion she got off as [employer].  Advised the patient not to take any medication that has not been prescribed by her medical team

## 2024-10-08 LAB — MICROALBUMIN / CREATININE URINE RATIO
Creatinine, Urine: 78.5 mg/dL
Microalb/Creat Ratio: 8 mg/g{creat} (ref 0–29)
Microalbumin, Urine: 6.4 ug/mL

## 2024-10-10 ENCOUNTER — Ambulatory Visit: Payer: Self-pay | Admitting: Nurse Practitioner

## 2024-10-10 DIAGNOSIS — M545 Low back pain, unspecified: Secondary | ICD-10-CM | POA: Diagnosis not present

## 2024-10-12 DIAGNOSIS — M25562 Pain in left knee: Secondary | ICD-10-CM | POA: Diagnosis not present

## 2024-10-17 ENCOUNTER — Other Ambulatory Visit: Payer: Self-pay | Admitting: Cardiology

## 2024-10-17 DIAGNOSIS — I10 Essential (primary) hypertension: Secondary | ICD-10-CM

## 2024-10-18 ENCOUNTER — Other Ambulatory Visit: Payer: Self-pay | Admitting: Nurse Practitioner

## 2024-10-18 DIAGNOSIS — E1165 Type 2 diabetes mellitus with hyperglycemia: Secondary | ICD-10-CM

## 2024-10-18 MED ORDER — METFORMIN HCL ER 500 MG PO TB24: 500.0000 mg | ORAL_TABLET | Freq: Every day | ORAL | 1 refills | Status: AC

## 2024-10-18 MED ORDER — LABETALOL HCL 200 MG PO TABS: 200.0000 mg | ORAL_TABLET | Freq: Two times a day (BID) | ORAL | 3 refills | Status: AC

## 2024-10-19 DIAGNOSIS — M25562 Pain in left knee: Secondary | ICD-10-CM | POA: Diagnosis not present

## 2024-10-21 DIAGNOSIS — G8918 Other acute postprocedural pain: Secondary | ICD-10-CM | POA: Diagnosis not present

## 2024-10-21 DIAGNOSIS — M19011 Primary osteoarthritis, right shoulder: Secondary | ICD-10-CM | POA: Diagnosis not present

## 2024-10-21 DIAGNOSIS — S43491A Other sprain of right shoulder joint, initial encounter: Secondary | ICD-10-CM | POA: Diagnosis not present

## 2024-10-21 DIAGNOSIS — M7541 Impingement syndrome of right shoulder: Secondary | ICD-10-CM | POA: Diagnosis not present

## 2024-10-21 DIAGNOSIS — X58XXXA Exposure to other specified factors, initial encounter: Secondary | ICD-10-CM | POA: Diagnosis not present

## 2024-10-21 DIAGNOSIS — Y999 Unspecified external cause status: Secondary | ICD-10-CM | POA: Diagnosis not present

## 2024-10-21 DIAGNOSIS — M75111 Incomplete rotator cuff tear or rupture of right shoulder, not specified as traumatic: Secondary | ICD-10-CM | POA: Diagnosis not present

## 2024-10-21 DIAGNOSIS — M66811 Spontaneous rupture of other tendons, right shoulder: Secondary | ICD-10-CM | POA: Diagnosis not present

## 2024-10-21 DIAGNOSIS — M65811 Other synovitis and tenosynovitis, right shoulder: Secondary | ICD-10-CM | POA: Diagnosis not present

## 2024-10-21 DIAGNOSIS — M7521 Bicipital tendinitis, right shoulder: Secondary | ICD-10-CM | POA: Diagnosis not present

## 2024-10-21 DIAGNOSIS — M25811 Other specified joint disorders, right shoulder: Secondary | ICD-10-CM | POA: Diagnosis not present

## 2024-10-31 DIAGNOSIS — M75101 Unspecified rotator cuff tear or rupture of right shoulder, not specified as traumatic: Secondary | ICD-10-CM | POA: Diagnosis not present

## 2024-10-31 DIAGNOSIS — M25511 Pain in right shoulder: Secondary | ICD-10-CM | POA: Diagnosis not present

## 2024-11-07 DIAGNOSIS — M25511 Pain in right shoulder: Secondary | ICD-10-CM | POA: Diagnosis not present

## 2024-11-07 DIAGNOSIS — M75101 Unspecified rotator cuff tear or rupture of right shoulder, not specified as traumatic: Secondary | ICD-10-CM | POA: Diagnosis not present

## 2024-11-09 ENCOUNTER — Other Ambulatory Visit: Payer: Self-pay | Admitting: Nurse Practitioner

## 2024-11-09 DIAGNOSIS — G43709 Chronic migraine without aura, not intractable, without status migrainosus: Secondary | ICD-10-CM

## 2024-11-09 MED ORDER — UBRELVY 50 MG PO TABS: 50.0000 mg | ORAL_TABLET | Freq: Every day | ORAL | 1 refills | Status: DC | PRN

## 2024-11-16 DIAGNOSIS — G4733 Obstructive sleep apnea (adult) (pediatric): Secondary | ICD-10-CM | POA: Diagnosis not present

## 2024-11-22 DIAGNOSIS — M25511 Pain in right shoulder: Secondary | ICD-10-CM | POA: Diagnosis not present

## 2024-11-22 DIAGNOSIS — M75101 Unspecified rotator cuff tear or rupture of right shoulder, not specified as traumatic: Secondary | ICD-10-CM | POA: Diagnosis not present

## 2024-11-23 ENCOUNTER — Ambulatory Visit: Admitting: Family Medicine

## 2024-11-29 DIAGNOSIS — M75101 Unspecified rotator cuff tear or rupture of right shoulder, not specified as traumatic: Secondary | ICD-10-CM | POA: Diagnosis not present

## 2024-11-29 DIAGNOSIS — M25511 Pain in right shoulder: Secondary | ICD-10-CM | POA: Diagnosis not present

## 2024-12-05 DIAGNOSIS — M25511 Pain in right shoulder: Secondary | ICD-10-CM | POA: Diagnosis not present

## 2024-12-05 DIAGNOSIS — M75101 Unspecified rotator cuff tear or rupture of right shoulder, not specified as traumatic: Secondary | ICD-10-CM | POA: Diagnosis not present

## 2024-12-09 ENCOUNTER — Other Ambulatory Visit: Payer: Self-pay | Admitting: Family Medicine

## 2024-12-09 DIAGNOSIS — D508 Other iron deficiency anemias: Secondary | ICD-10-CM

## 2024-12-12 ENCOUNTER — Encounter: Payer: Self-pay | Admitting: Nurse Practitioner

## 2024-12-12 ENCOUNTER — Ambulatory Visit: Payer: Self-pay | Admitting: Nurse Practitioner

## 2024-12-12 VITALS — BP 132/78 | HR 85 | Wt 236.0 lb

## 2024-12-12 DIAGNOSIS — E785 Hyperlipidemia, unspecified: Secondary | ICD-10-CM

## 2024-12-12 DIAGNOSIS — M25511 Pain in right shoulder: Secondary | ICD-10-CM

## 2024-12-12 DIAGNOSIS — G43709 Chronic migraine without aura, not intractable, without status migrainosus: Secondary | ICD-10-CM

## 2024-12-12 DIAGNOSIS — E876 Hypokalemia: Secondary | ICD-10-CM | POA: Diagnosis not present

## 2024-12-12 DIAGNOSIS — G8929 Other chronic pain: Secondary | ICD-10-CM

## 2024-12-12 DIAGNOSIS — E66811 Obesity, class 1: Secondary | ICD-10-CM

## 2024-12-12 DIAGNOSIS — F419 Anxiety disorder, unspecified: Secondary | ICD-10-CM | POA: Diagnosis not present

## 2024-12-12 DIAGNOSIS — E1165 Type 2 diabetes mellitus with hyperglycemia: Secondary | ICD-10-CM | POA: Diagnosis not present

## 2024-12-12 DIAGNOSIS — I1A0 Resistant hypertension: Secondary | ICD-10-CM

## 2024-12-12 DIAGNOSIS — I10 Essential (primary) hypertension: Secondary | ICD-10-CM | POA: Diagnosis not present

## 2024-12-12 LAB — POCT GLYCOSYLATED HEMOGLOBIN (HGB A1C): Hemoglobin A1C: 6.5 % — AB (ref 4.0–5.6)

## 2024-12-12 MED ORDER — BLOOD GLUCOSE TEST VI STRP: 1.0000 | ORAL_STRIP | 0 refills | Status: AC

## 2024-12-12 MED ORDER — LANCETS MISC: 1.0000 | 0 refills | Status: AC

## 2024-12-12 MED ORDER — LANCET DEVICE MISC: 1.0000 | 0 refills | Status: AC

## 2024-12-12 MED ORDER — BLOOD GLUCOSE MONITORING SUPPL DEVI: 1.0000 | 0 refills | Status: AC

## 2024-12-12 NOTE — Assessment & Plan Note (Signed)
" °    12/12/2024    9:56 AM 07/08/2023   10:49 AM 06/08/2023    3:22 PM 06/27/2022    9:25 AM  GAD 7 : Generalized Anxiety Score  Nervous, Anxious, on Edge 0 1 1 0  Control/stop worrying 0 0 1 0  Worry too much - different things 0 1 0 0  Trouble relaxing 0 0 0 0  Restless 0 0 0 0  Easily annoyed or irritable 0 1 1 0  Afraid - awful might happen 0 0 0 0  Total GAD 7 Score 0 3 3 0  Anxiety Difficulty Not difficult at all Somewhat difficult Somewhat difficult Not difficult at all     Managed with paroxetine  12.5 mg daily. - Continue paroxetine  12.5 mg daily.   "

## 2024-12-12 NOTE — Assessment & Plan Note (Signed)
 Lab Results  Component Value Date   HGBA1C 6.5 (A) 12/12/2024   Type 2 diabetes mellitus with hyperglycemia A1c at 6.5 indicates controlled diabetes. Metformin  taken only on weekends due to medication burden. - Encouraged daily metformin  use for better glycemic control. Diabetic foot exam completed, she has seen ophthalmologist this year will request for records - Advised on lifestyle modifications including avoiding sugar, sweets, and soda. - Discussed portion control and the importance of consuming fruits instead of juices.
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
## 2024-12-12 NOTE — Patient Instructions (Addendum)
 Goal for fasting blood sugar ranges from 80 to 120 and 2 hours after any meal or at bedtime should be between 130 to 170.   1. Controlled type 2 diabetes mellitus with hyperglycemia, without long-term current use of insulin (HCC) (Primary) - POCT glycosylated hemoglobin (Hb A1C) - Blood Glucose Monitoring Suppl DEVI; 1 each by Does not apply route as directed. Dispense based on patient and insurance preference. Use up to four times daily as directed. (FOR ICD-10 E10.9, E11.9).  Dispense: 1 each; Refill: 0 - Glucose Blood (BLOOD GLUCOSE TEST STRIPS) STRP; 1 each by Does not apply route as directed. Dispense based on patient and insurance preference. Use up to four times daily as needed to check blood sugar (FOR ICD-10 E10.9, E11.9).  Dispense: 100 strip; Refill: 0 - Lancet Device MISC; 1 each by Does not apply route as directed. Dispense based on patient and insurance preference. Use up to four times daily as directed. (FOR ICD-10 E10.9, E11.9).  Dispense: 1 each; Refill: 0 - Lancets MISC; 1 each by Does not apply route as directed. Dispense based on patient and insurance preference. Use up to four times daily as directed. (FOR ICD-10 E10.9, E11.9).  Dispense: 100 each; Refill: 0  2. Anxiety  3. Class 1 obesity  4. Chronic migraine without aura without status migrainosus, not intractable - Ambulatory referral to Neurology  5. Resistant hypertension - CBC - Basic Metabolic Panel  6. Dyslipidemia, goal LDL below 70 - Lipid panel   It is important that you exercise regularly at least 30 minutes 5 times a week as tolerated  Think about what you will eat, plan ahead. Choose  clean, green, fresh or frozen over canned, processed or packaged foods which are more sugary, salty and fatty. 70 to 75% of food eaten should be vegetables and fruit. Three meals at set times with snacks allowed between meals, but they must be fruit or vegetables. Aim to eat over a 12 hour period , example 7 am to 7 pm,  and STOP after  your last meal of the day. Drink water,generally about 64 ounces per day, no other drink is as healthy. Fruit juice is best enjoyed in a healthy way, by EATING the fruit.  Thanks for choosing [facility] we consider it a privelige to serve you.

## 2024-12-12 NOTE — Assessment & Plan Note (Addendum)
 Pain in right shoulder,   Status post right shoulder arthroscopy with subacromial decompression distal clavicle resection and arthroscopic rotator cuff repair   Post-surgical pain and limited mobility following shoulder surgery. - Continue physical therapy sessions. - Advised on following post-operative precautions.

## 2024-12-12 NOTE — Assessment & Plan Note (Signed)
 BP Readings from Last 3 Encounters:  12/12/24 132/78  10/07/24 139/86  08/25/24 102/64   Essential hypertension Home blood pressure readings 140-150 mmHg systolic. Current medications include amlodipine , hydralazine , labetalol , and valsartan -hydrochlorothiazide  and Cardura  - Continue current antihypertensive regimen. - Encouraged adherence to medication schedule. - Advised on a heart-healthy, low-salt, low-fat diet. Maintain close follow-up with cardiology Dyslipidemia Previous LDL was 117 mg/dL. Monitoring of cholesterol levels is ongoing. - Checked cholesterol levels. - Continue rosuvastatin  10 mg daily.

## 2024-12-12 NOTE — Assessment & Plan Note (Signed)
 Continue potassium 20 mEq twice daily

## 2024-12-12 NOTE — Assessment & Plan Note (Addendum)
 Wt Readings from Last 3 Encounters:  12/12/24 236 lb (107 kg)  10/07/24 234 lb (106.1 kg)  08/25/24 232 lb (105.2 kg)    Body mass index is 33.86 kg/m.   Weight is increasing. - Encouraged moderate to vigorous exercise 5 days a week as tolerated. - Advised on dietary modifications focusing on 70-80% vegetables and protein, reducing carbohydrates.

## 2024-12-12 NOTE — Assessment & Plan Note (Signed)
 Chronic migraine without aura Migraines are severe and debilitating. Awaiting cardiologist approval for Ubrelvy  due to heart history. - Await cardiologist approval for a triptan  - Consider referral to a new neurologist within the [facility] network.
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
## 2024-12-12 NOTE — Assessment & Plan Note (Signed)
 Lab Results  Component Value Date   CHOL 190 08/25/2024   HDL 54.90 08/25/2024   LDLCALC 117 (H) 08/25/2024   LDLDIRECT 136.0 06/10/2021   TRIG 90.0 08/25/2024   CHOLHDL 3 08/25/2024  On Crestor  10 mg daily Rechecking labs

## 2024-12-13 ENCOUNTER — Ambulatory Visit: Payer: Self-pay | Admitting: Nurse Practitioner

## 2024-12-13 DIAGNOSIS — M25511 Pain in right shoulder: Secondary | ICD-10-CM | POA: Diagnosis not present

## 2024-12-13 DIAGNOSIS — M75101 Unspecified rotator cuff tear or rupture of right shoulder, not specified as traumatic: Secondary | ICD-10-CM | POA: Diagnosis not present

## 2024-12-13 DIAGNOSIS — E785 Hyperlipidemia, unspecified: Secondary | ICD-10-CM

## 2024-12-13 LAB — BASIC METABOLIC PANEL WITH GFR
BUN/Creatinine Ratio: 14 (ref 9–23)
BUN: 13 mg/dL (ref 6–24)
CO2: 22 mmol/L (ref 20–29)
Calcium: 9.5 mg/dL (ref 8.7–10.2)
Chloride: 100 mmol/L (ref 96–106)
Creatinine, Ser: 0.94 mg/dL (ref 0.57–1.00)
Glucose: 119 mg/dL — ABNORMAL HIGH (ref 70–99)
Potassium: 3.3 mmol/L — ABNORMAL LOW (ref 3.5–5.2)
Sodium: 138 mmol/L (ref 134–144)
eGFR: 74 mL/min/1.73

## 2024-12-13 LAB — CBC
Hematocrit: 37.9 % (ref 34.0–46.6)
Hemoglobin: 12.3 g/dL (ref 11.1–15.9)
MCH: 28 pg (ref 26.6–33.0)
MCHC: 32.5 g/dL (ref 31.5–35.7)
MCV: 86 fL (ref 79–97)
Platelets: 228 x10E3/uL (ref 150–450)
RBC: 4.4 x10E6/uL (ref 3.77–5.28)
RDW: 14.4 % (ref 11.7–15.4)
WBC: 7.6 x10E3/uL (ref 3.4–10.8)

## 2024-12-13 LAB — LIPID PANEL
Chol/HDL Ratio: 4.9 ratio — ABNORMAL HIGH (ref 0.0–4.4)
Cholesterol, Total: 217 mg/dL — ABNORMAL HIGH (ref 100–199)
HDL: 44 mg/dL
LDL Chol Calc (NIH): 149 mg/dL — ABNORMAL HIGH (ref 0–99)
Triglycerides: 134 mg/dL (ref 0–149)
VLDL Cholesterol Cal: 24 mg/dL (ref 5–40)

## 2024-12-13 MED ORDER — ROSUVASTATIN CALCIUM 20 MG PO TABS: 20.0000 mg | ORAL_TABLET | Freq: Every day | ORAL | 2 refills | Status: AC

## 2024-12-23 ENCOUNTER — Other Ambulatory Visit: Payer: Self-pay | Admitting: Nurse Practitioner

## 2024-12-23 DIAGNOSIS — G43709 Chronic migraine without aura, not intractable, without status migrainosus: Secondary | ICD-10-CM

## 2024-12-23 MED ORDER — RIZATRIPTAN BENZOATE 5 MG PO TABS: 5.0000 mg | ORAL_TABLET | Freq: Once | ORAL | 1 refills | Status: AC | PRN

## 2024-12-28 ENCOUNTER — Other Ambulatory Visit: Payer: Self-pay

## 2025-01-11 ENCOUNTER — Other Ambulatory Visit: Payer: Self-pay | Admitting: Nurse Practitioner

## 2025-01-11 ENCOUNTER — Encounter: Payer: Self-pay | Admitting: Neurology

## 2025-01-11 DIAGNOSIS — D508 Other iron deficiency anemias: Secondary | ICD-10-CM

## 2025-01-11 MED ORDER — FERROUS SULFATE 325 (65 FE) MG PO TBEC: 325.0000 mg | DELAYED_RELEASE_TABLET | Freq: Every day | ORAL | 0 refills | Status: AC

## 2025-02-06 ENCOUNTER — Ambulatory Visit: Payer: Self-pay | Admitting: Nurse Practitioner

## 2025-02-27 ENCOUNTER — Ambulatory Visit: Admitting: Cardiology

## 2025-04-12 ENCOUNTER — Ambulatory Visit: Payer: Self-pay | Admitting: Nurse Practitioner

## 2025-04-24 ENCOUNTER — Ambulatory Visit: Payer: Self-pay | Admitting: Neurology
